# Patient Record
Sex: Female | Born: 1955
Health system: Southern US, Community
[De-identification: ages and names within clinical notes are randomized; demographics above are authoritative.]

## PROBLEM LIST (undated history)

## (undated) ENCOUNTER — Ambulatory Visit (HOSPITAL_COMMUNITY): Admission: EM | Payer: Medicare HMO

## (undated) ENCOUNTER — Ambulatory Visit (INDEPENDENT_AMBULATORY_CARE_PROVIDER_SITE_OTHER): Admission: EM | Payer: Medicare HMO | Source: Home / Self Care

## (undated) ENCOUNTER — Ambulatory Visit (HOSPITAL_COMMUNITY): Admission: EM | Payer: Medicare HMO | Source: Home / Self Care

## (undated) DIAGNOSIS — E119 Type 2 diabetes mellitus without complications: Secondary | ICD-10-CM

## (undated) DIAGNOSIS — R7611 Nonspecific reaction to tuberculin skin test without active tuberculosis: Secondary | ICD-10-CM

## (undated) DIAGNOSIS — I10 Essential (primary) hypertension: Secondary | ICD-10-CM

## (undated) DIAGNOSIS — E785 Hyperlipidemia, unspecified: Secondary | ICD-10-CM

## (undated) DIAGNOSIS — M542 Cervicalgia: Secondary | ICD-10-CM

## (undated) HISTORY — DX: Cervicalgia: M54.2

## (undated) HISTORY — DX: Essential (primary) hypertension: I10

## (undated) HISTORY — PX: BREAST CYST EXCISION: SHX579

## (undated) HISTORY — DX: Type 2 diabetes mellitus without complications: E11.9

## (undated) HISTORY — PX: BREAST EXCISIONAL BIOPSY: SUR124

## (undated) HISTORY — DX: Nonspecific reaction to tuberculin skin test without active tuberculosis: R76.11

## (undated) HISTORY — DX: Hyperlipidemia, unspecified: E78.5

## (undated) HISTORY — PX: ABDOMINAL HYSTERECTOMY: SHX81

---

## 2013-07-11 HISTORY — PX: CYST REMOVAL LEG: SHX6280

## 2014-02-11 ENCOUNTER — Ambulatory Visit: Payer: Self-pay | Admitting: Internal Medicine

## 2014-02-11 ENCOUNTER — Encounter: Payer: Self-pay | Admitting: Internal Medicine

## 2014-02-11 ENCOUNTER — Ambulatory Visit (INDEPENDENT_AMBULATORY_CARE_PROVIDER_SITE_OTHER): Payer: No Typology Code available for payment source | Admitting: Internal Medicine

## 2014-02-11 VITALS — BP 141/92 | HR 89 | Temp 97.9°F | Ht 65.0 in | Wt 193.0 lb

## 2014-02-11 DIAGNOSIS — E119 Type 2 diabetes mellitus without complications: Secondary | ICD-10-CM

## 2014-02-11 DIAGNOSIS — R7611 Nonspecific reaction to tuberculin skin test without active tuberculosis: Secondary | ICD-10-CM | POA: Insufficient documentation

## 2014-02-11 DIAGNOSIS — I10 Essential (primary) hypertension: Secondary | ICD-10-CM

## 2014-02-11 DIAGNOSIS — E785 Hyperlipidemia, unspecified: Secondary | ICD-10-CM

## 2014-02-11 HISTORY — DX: Hyperlipidemia, unspecified: E78.5

## 2014-02-11 LAB — BASIC METABOLIC PANEL
BUN: 14 mg/dL (ref 6–23)
CHLORIDE: 103 meq/L (ref 96–112)
CO2: 26 mEq/L (ref 19–32)
CREATININE: 0.8 mg/dL (ref 0.4–1.2)
Calcium: 9.8 mg/dL (ref 8.4–10.5)
GFR: 101.11 mL/min (ref >60.00)
Glucose, Bld: 119 mg/dL — ABNORMAL HIGH (ref 70–99)
Potassium: 3.8 mEq/L (ref 3.5–5.1)
SODIUM: 138 meq/L (ref 135–145)

## 2014-02-11 LAB — LIPID PANEL
Cholesterol: 196 mg/dL (ref 0–200)
HDL: 73.7 mg/dL (ref >39.00)
LDL Cholesterol: 112 mg/dL — ABNORMAL HIGH (ref 0–99)
NONHDL: 122.3
Total CHOL/HDL Ratio: 3
Triglycerides: 54 mg/dL (ref 0.0–149.0)
VLDL: 10.8 mg/dL (ref 0.0–40.0)

## 2014-02-11 LAB — HEMOGLOBIN A1C: HEMOGLOBIN A1C: 6.6 % — AB (ref 4.6–6.5)

## 2014-02-11 LAB — AST: AST: 17 U/L (ref 0–37)

## 2014-02-11 LAB — ALT: ALT: 14 U/L (ref 0–35)

## 2014-02-11 MED ORDER — METFORMIN HCL 1000 MG PO TABS: 1000.0000 mg | ORAL_TABLET | Freq: Every day | ORAL | Status: DC

## 2014-02-11 MED ORDER — LISINOPRIL 20 MG PO TABS: 20.0000 mg | ORAL_TABLET | Freq: Every day | ORAL | Status: DC

## 2014-02-11 MED ORDER — GLUCOSE BLOOD VI STRP: ORAL_STRIP | Status: DC

## 2014-02-11 MED ORDER — LOVASTATIN 20 MG PO TABS: 20.0000 mg | ORAL_TABLET | Freq: Every day | ORAL | Status: DC

## 2014-02-11 MED ORDER — AMLODIPINE BESYLATE 10 MG PO TABS: 10.0000 mg | ORAL_TABLET | Freq: Every day | ORAL | Status: DC

## 2014-02-11 NOTE — Assessment & Plan Note (Signed)
Symptoms well controlled, continue with amlodipine and lisinopril. Labs

## 2014-02-11 NOTE — Progress Notes (Signed)
Subjective:    Patient ID: Lisa CitizenMaima Valley, female    DOB: [birth year], 58 y.o.   MRN: [id number]  DOS:  02/11/2014 Type of visit - description : new patient  Interval history: Hypertension, good compliance with medications, BPs usually 130/80, occasionally do go up to 140/90. Diabetes, on metformin, CBGs in the morning before breakfast are usually 115, sometimes go to the 130s. High cholesterol, good compliance with medication without apparent side effects.   Labs from 04/03/2013: CBC normal, creatinine 0.6, potassium 3.8. AST, ALT normal. Total cholesterol 199, triglycerides 53, HDL 80, LDL 108 A1c 6.9 PSA 0.8 Vitamin D (which is low)  ROS No  CP, SOB  Denies  nausea, vomiting diarrhea, blood in the stools  (-) cough, sputum production (-) wheezing, chest congestion     Past Medical History  Diagnosis Date  . Neck pain   . Diabetes mellitus without complication   . Hypertension   . 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 Hyperlipidemia 02/11/2014    Past Surgical History  Procedure Laterality Date  . Abdominal hysterectomy      abdominal, no oophorectomy  . Cyst removal leg  07-2013    inner L thigh, Dr Vickki Muff[place]on, [name]   . Breast cyst excision      History   Social History  . Marital Status: Single    Spouse Name: N/A    Number of Children: 1  . Years of Education: N/A   Occupational History  . CNA -- [employer]     Social History Main Topics  . Smoking status: Never Smoker   . Smokeless tobacco: Never Used  . Alcohol Use: 0.0 oz/week    0 Not specified per week     Comment: wine, rare   . Drug Use: No  . Sexual Activity: Not on file   Other Topics Concern  . Not on file   Social History Narrative   Born in [place]   [place] years, [place], [place]   Lives w/ sister      Family History  Problem Relation Age of Onset  . Colon cancer Neg Hx   . Breast cancer Neg Hx   . CAD Neg Hx   . Diabetes Neg Hx       Medication List        This list is accurate as of: 02/11/14  6:49 PM.  Always use your most recent med list.               amLODipine 10 MG tablet  Commonly known as:  NORVASC  Take 1 tablet (10 mg total) by mouth daily.     glucose blood test strip  Use as instructed     lisinopril 20 MG tablet  Commonly known as:  PRINIVIL,ZESTRIL  Take 1 tablet (20 mg total) by mouth daily.     lovastatin 20 MG tablet  Commonly known as:  MEVACOR  Take 1 tablet (20 mg total) by mouth at bedtime.     metFORMIN 1000 MG tablet  Commonly known as:  GLUCOPHAGE  Take 1 tablet (1,000 mg total) by mouth daily with breakfast.           Objective:   Physical Exam BP 141/92 mmHg  Pulse 89  Temp(Src) 97.9 F (36.6 C) (Oral)  Ht 5\' 5"  (1.651 m)  Wt 193 lb (87.544 kg)  BMI 32.12 kg/m2  SpO2 100%    General -- alert, well-developed, NAD.  Neck --no thyromegaly  HEENT-- Not pale.   Lungs -- normal respiratory effort, no intercostal retractions, no accessory muscle use, and normal breath sounds.  Heart-- normal rate, regular rhythm, no murmur.  Abdomen-- Not distended, good bowel sounds,soft, non-tender.  Extremities-- no pretibial edema bilaterally  Neurologic--  alert & oriented X3. Speech normal, gait appropriate for age, strength symmetric and appropriate for age.  Psych-- Cognition and judgment appear intact. Cooperative with normal attention span and concentration. No anxious or depressed appearing.       Assessment & Plan:   Also reports: Last Pap smear 2008 Pregnancies 3, deliveries 1 Last mammogram 2007

## 2014-02-11 NOTE — Patient Instructions (Addendum)
Get your blood work before you leave   All about diabetes, great resource!  http://www.[facility].com/http://www.[facility].org/diabetes-information.html   Please come back to the office in 3 months  for a routine check up , no  fasting

## 2014-02-11 NOTE — Progress Notes (Signed)
Pre visit review using our clinic review tool, if applicable. No additional management support is needed unless otherwise documented below in the visit note. 

## 2014-02-11 NOTE — Assessment & Plan Note (Addendum)
Continue with metformin 1000 mg 1 by mouth daily. Had an eye exam 6 months ago, reportedly no retinopathy. She still has occasional blurred vision, she  thinks related to high sugars. We discussed diet, exercise, refer her to read from the [facility] website. Check A1c, adjust medications if needed

## 2014-02-11 NOTE — Assessment & Plan Note (Signed)
Continue with Mevacor, check FLP

## 2014-02-11 NOTE — Assessment & Plan Note (Signed)
+   PPD in the 90s, does not get further PPDs, gets quantiferon  levels

## 2014-02-15 MED ORDER — LOVASTATIN 40 MG PO TABS: 40.0000 mg | ORAL_TABLET | Freq: Every day | ORAL | Status: DC

## 2014-02-15 NOTE — Addendum Note (Signed)
Addended by: Dorette GrateFAULKNER, Mats Jeanlouis C on: 02/15/2014 01:29 PM   Modules accepted: Orders, Medications

## 2014-05-14 ENCOUNTER — Ambulatory Visit: Payer: No Typology Code available for payment source | Admitting: Internal Medicine

## 2014-05-15 ENCOUNTER — Ambulatory Visit: Payer: No Typology Code available for payment source | Admitting: Internal Medicine

## 2014-06-26 ENCOUNTER — Ambulatory Visit: Payer: No Typology Code available for payment source | Attending: Internal Medicine | Admitting: Internal Medicine

## 2014-06-26 ENCOUNTER — Encounter: Payer: Self-pay | Admitting: Internal Medicine

## 2014-06-26 VITALS — BP 138/85 | HR 64 | Temp 98.0°F | Resp 16 | Wt 199.2 lb

## 2014-06-26 DIAGNOSIS — E785 Hyperlipidemia, unspecified: Secondary | ICD-10-CM

## 2014-06-26 DIAGNOSIS — E139 Other specified diabetes mellitus without complications: Secondary | ICD-10-CM

## 2014-06-26 DIAGNOSIS — Z139 Encounter for screening, unspecified: Secondary | ICD-10-CM

## 2014-06-26 DIAGNOSIS — I10 Essential (primary) hypertension: Secondary | ICD-10-CM

## 2014-06-26 DIAGNOSIS — Z1211 Encounter for screening for malignant neoplasm of colon: Secondary | ICD-10-CM

## 2014-06-26 DIAGNOSIS — Z1231 Encounter for screening mammogram for malignant neoplasm of breast: Secondary | ICD-10-CM

## 2014-06-26 LAB — GLUCOSE, POCT (MANUAL RESULT ENTRY): POC Glucose: 106 mg/dl — AB (ref 70–99)

## 2014-06-26 LAB — POCT GLYCOSYLATED HEMOGLOBIN (HGB A1C): Hemoglobin A1C: 6.4

## 2014-06-26 MED ORDER — AMLODIPINE BESYLATE 10 MG PO TABS: 10.0000 mg | ORAL_TABLET | Freq: Every day | ORAL | Status: DC

## 2014-06-26 NOTE — Progress Notes (Signed)
Patient here to establish care Has history of diabetes and hypertension

## 2014-06-26 NOTE — Progress Notes (Signed)
Patient Demographics  Lisa Meyers, is a 59 y.o. female  GNF:[id number]  QIO:[id number]  DOB - 1956-01-25  CC:  Chief Complaint  Patient presents with  . Establish Care       HPI: Lisa Meyers is a 59 y.o. female here today to establish medical care.patient has history of diabetes, hypertension, hyperlipidemia, she is compliant in taking her medications, her diabetes is well controlled hemoglobin A1c today 6.4%, she denies any hypoglycemic symptoms. Patient is requesting refill on her medication. Patient has No headache, No chest pain, No abdominal pain - No Nausea, No new weakness tingling or numbness, No Cough - SOB.  No Known Allergies Past Medical History  Diagnosis Date  . Neck pain   . Diabetes mellitus without complication   . Hypertension   . 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 Hyperlipidemia 02/11/2014   Current Outpatient Prescriptions on File Prior to Visit  Medication Sig Dispense Refill  . glucose blood test strip Use as instructed 100 each 12  . lisinopril (PRINIVIL,ZESTRIL) 20 MG tablet Take 1 tablet (20 mg total) by mouth daily. 30 tablet 6  . lovastatin (MEVACOR) 40 MG tablet Take 1 tablet (40 mg total) by mouth at bedtime. 30 tablet 3  . metFORMIN (GLUCOPHAGE) 1000 MG tablet Take 1 tablet (1,000 mg total) by mouth daily with breakfast. 30 tablet 6   No current facility-administered medications on file prior to visit.   Family History  Problem Relation Age of Onset  . Colon cancer Neg Hx   . Breast cancer Neg Hx   . CAD Neg Hx   . Hypertension Mother   . Hypertension Father   . Heart disease Father   . Diabetes Maternal Aunt    History   Social History  . Marital Status: Single    Spouse Name: N/A  . Number of Children: 1  . Years of Education: N/A   Occupational History  . CNA -- [employer]     Social History Main Topics  . Smoking status: Never Smoker   . Smokeless tobacco: Never Used  . Alcohol Use: 0.0 oz/week    0 Standard drinks or  equivalent per week     Comment: wine, rare   . Drug Use: No  . Sexual Activity: Not on file   Other Topics Concern  . Not on file   Social History Narrative   Born in [place]   Live in [place] x years, then [place], moved to [place]   Lives w/ sister     Review of Systems: Constitutional: Negative for fever, chills, diaphoresis, activity change, appetite change and fatigue. HENT: Negative for ear pain, nosebleeds, congestion, facial swelling, rhinorrhea, neck pain, neck stiffness and ear discharge.  Eyes: Negative for pain, discharge, redness, itching and visual disturbance. Respiratory: Negative for cough, choking, chest tightness, shortness of breath, wheezing and stridor.  Cardiovascular: Negative for chest pain, palpitations and leg swelling. Gastrointestinal: Negative for abdominal distention. Genitourinary: Negative for dysuria, urgency, frequency, hematuria, flank pain, decreased urine volume, difficulty urinating and dyspareunia.  Musculoskeletal: Negative for back pain, joint swelling, arthralgia and gait problem. Neurological: Negative for dizziness, tremors, seizures, syncope, facial asymmetry, speech difficulty, weakness, light-headedness, numbness and headaches.  Hematological: Negative for adenopathy. Does not bruise/bleed easily. Psychiatric/Behavioral: Negative for hallucinations, behavioral problems, confusion, dysphoric mood, decreased concentration and agitation.    Objective:   Filed Vitals:   06/26/14 1049  BP: 138/85  Pulse: 64  Temp: 98 F (36.7 C)  Resp: 16    Physical Exam: Constitutional: Patient appears well-developed and well-nourished. No distress. HENT: Normocephalic, atraumatic, External right and left ear normal. Oropharynx is clear and moist.  Eyes: Conjunctivae and EOM are normal. PERRLA, no scleral icterus. Neck: Normal ROM. Neck supple. No JVD. No tracheal deviation. No thyromegaly. CVS: RRR, S1/S2 +, no murmurs, no gallops, no  carotid bruit.  Pulmonary: Effort and breath sounds normal, no stridor, rhonchi, wheezes, rales.  Abdominal: Soft. BS +, no distension, tenderness, rebound or guarding.  Musculoskeletal: Normal range of motion. No edema and no tenderness.  Neuro: Alert. Normal reflexes, muscle tone coordination. No cranial nerve deficit. Skin: Skin is warm and dry. No rash noted. Not diaphoretic. No erythema. No pallor. Psychiatric: Normal mood and affect. Behavior, judgment, thought content normal.  No results found for: WBC, HGB, HCT, MCV, PLT Lab Results  Component Value Date   CREATININE 0.8 02/11/2014   BUN 14 02/11/2014   NA 138 02/11/2014   K 3.8 02/11/2014   CL 103 02/11/2014   CO2 26 02/11/2014    Lab Results  Component Value Date   HGBA1C 6.40 06/26/2014   Lipid Panel     Component Value Date/Time   CHOL 196 02/11/2014 1058   TRIG 54.0 02/11/2014 1058   HDL 73.70 02/11/2014 1058   CHOLHDL 3 02/11/2014 1058   VLDL 10.8 02/11/2014 1058   LDLCALC 112* 02/11/2014 1058       Assessment and plan:   1. Other specified diabetes mellitus without complications Results for orders placed or performed in visit on 06/26/14  Glucose (CBG)  Result Value Ref Range   POC Glucose 106.0 (A) 70 - 99 mg/dl  HgB [id number]  Result Value Ref Range   Hemoglobin A1C 6.40    Diabetes is well controlled, continue with her metformin. Will recheck A1c in 3 months. - Glucose (CBG) - HgB A1c - COMPLETE METABOLIC PANEL WITH GFR; Future  2. Essential hypertension Advise patient for DASH diet, continue with Norvasc and lisinopril. - amLODipine (NORVASC) 10 MG tablet; Take 1 tablet (10 mg total) by mouth daily.  Dispense: 30 tablet; Refill: 3 - COMPLETE METABOLIC PANEL WITH GFR; Future  3. Hyperlipidemia Currently patient is on Mevacor 40 mg daily, will check fasting lipid panel on next visit.  4. Encounter for screening mammogram for breast cancer  - MM DIGITAL SCREENING BILATERAL; Future  5. Special  screening for malignant neoplasms, colon  - Ambulatory referral to Gastroenterology  6. Screening Ordered baseline blood work. Patient would like to come back in one month's time. - CBC with Differential/Platelet; Future - Lipid panel; Future - TSH; Future - Vit D  25 hydroxy (rtn osteoporosis monitoring); Future        Health Maintenance -Colonoscopy:referred to GI -Pap Smear:will be scheduled -Mammogram:ordered -Vaccinations:  Up-to-date with flu shot.  Return in about 3 months (around 09/26/2014) for diabetes, hypertension, hyperipidemia, Schedule Appt with Dr Glendora ScoreFunches/ [facility] for PAP.    The patient was given clear instructions to go to ER or return to medical center if symptoms don't improve, worsen or new problems develop. The patient verbalized understanding. The patient was told to call to get lab results if they haven't heard anything in the next week.    This note has been created with Education officer, environmentalDragon speech recognition software and smart phrase technology. Any transcriptional errors are unintentional.   Doris CheadleADVANI, Moranda Billiot, MD

## 2014-06-26 NOTE — Patient Instructions (Signed)
DASH Eating Plan DASH stands for "Dietary Approaches to Stop Hypertension." The DASH eating plan is a healthy eating plan that has been shown to reduce high blood pressure (hypertension). Additional health benefits may include reducing the risk of type 2 diabetes mellitus, heart disease, and stroke. The DASH eating plan may also help with weight loss. WHAT DO I NEED TO KNOW ABOUT THE DASH EATING PLAN? For the DASH eating plan, you will follow these general guidelines:  Choose foods with a percent daily value for sodium of less than 5% (as listed on the food label).  Use salt-free seasonings or herbs instead of table salt or sea salt.  Check with your health care provider or pharmacist before using salt substitutes.  Eat lower-sodium products, often labeled as "lower sodium" or "no salt added."  Eat fresh foods.  Eat more vegetables, fruits, and low-fat dairy products.  Choose whole grains. Look for the word "whole" as the first word in the ingredient list.  Choose fish and skinless chicken or turkey more often than red meat. Limit fish, poultry, and meat to 6 oz (170 g) each day.  Limit sweets, desserts, sugars, and sugary drinks.  Choose heart-healthy fats.  Limit cheese to 1 oz (28 g) per day.  Eat more home-cooked food and less restaurant, buffet, and fast food.  Limit fried foods.  Cook foods using methods other than frying.  Limit canned vegetables. If you do use them, rinse them well to decrease the sodium.  When eating at a restaurant, ask that your food be prepared with less salt, or no salt if possible. WHAT FOODS CAN I EAT? Seek help from a dietitian for individual calorie needs. Grains Whole grain or whole wheat bread. Brown rice. Whole grain or whole wheat pasta. Quinoa, bulgur, and whole grain cereals. Low-sodium cereals. Corn or whole wheat flour tortillas. Whole grain cornbread. Whole grain crackers. Low-sodium crackers. Vegetables Fresh or frozen vegetables  (raw, steamed, roasted, or grilled). Low-sodium or reduced-sodium tomato and vegetable juices. Low-sodium or reduced-sodium tomato sauce and paste. Low-sodium or reduced-sodium canned vegetables.  Fruits All fresh, canned (in natural juice), or frozen fruits. Meat and Other Protein Products Ground beef (85% or leaner), grass-fed beef, or beef trimmed of fat. Skinless chicken or turkey. Ground chicken or turkey. Pork trimmed of fat. All fish and seafood. Eggs. Dried beans, peas, or lentils. Unsalted nuts and seeds. Unsalted canned beans. Dairy Low-fat dairy products, such as skim or 1% milk, 2% or reduced-fat cheeses, low-fat ricotta or cottage cheese, or plain low-fat yogurt. Low-sodium or reduced-sodium cheeses. Fats and Oils Tub margarines without trans fats. Light or reduced-fat mayonnaise and salad dressings (reduced sodium). Avocado. Safflower, olive, or canola oils. Natural peanut or almond butter. Other Unsalted popcorn and pretzels. The items listed above may not be a complete list of recommended foods or beverages. Contact your dietitian for more options. WHAT FOODS ARE NOT RECOMMENDED? Grains White bread. White pasta. White rice. Refined cornbread. Bagels and croissants. Crackers that contain trans fat. Vegetables Creamed or fried vegetables. Vegetables in a cheese sauce. Regular canned vegetables. Regular canned tomato sauce and paste. Regular tomato and vegetable juices. Fruits Dried fruits. Canned fruit in light or heavy syrup. Fruit juice. Meat and Other Protein Products Fatty cuts of meat. Ribs, chicken wings, bacon, sausage, bologna, salami, chitterlings, fatback, hot dogs, bratwurst, and packaged luncheon meats. Salted nuts and seeds. Canned beans with salt. Dairy Whole or 2% milk, cream, half-and-half, and cream cheese. Whole-fat or sweetened yogurt. Full-fat   cheeses or blue cheese. Nondairy creamers and whipped toppings. Processed cheese, cheese spreads, or cheese  curds. Condiments Onion and garlic salt, seasoned salt, table salt, and sea salt. Canned and packaged gravies. Worcestershire sauce. Tartar sauce. Barbecue sauce. Teriyaki sauce. Soy sauce, including reduced sodium. Steak sauce. Fish sauce. Oyster sauce. Cocktail sauce. Horseradish. Ketchup and mustard. Meat flavorings and tenderizers. Bouillon cubes. Hot sauce. Tabasco sauce. Marinades. Taco seasonings. Relishes. Fats and Oils Butter, stick margarine, lard, shortening, ghee, and bacon fat. Coconut, palm kernel, or palm oils. Regular salad dressings. Other Pickles and olives. Salted popcorn and pretzels. The items listed above may not be a complete list of foods and beverages to avoid. Contact your dietitian for more information. WHERE CAN I FIND MORE INFORMATION? National Heart, Lung, and Blood Institute: www.nhlbi.nih.gov/health/health-topics/topics/dash/ Document Released: 03/18/2011 Document Revised: 08/13/2013 Document Reviewed: 01/31/2013 ExitCare Patient Information 2015 [employer]. This information is not intended to replace advice given to you by your health care provider. Make sure you discuss any questions you have with your health care provider. Diabetes Mellitus and Food It is important for you to manage your blood sugar (glucose) level. Your blood glucose level can be greatly affected by what you eat. Eating healthier foods in the appropriate amounts throughout the day at about the same time each day will help you control your blood glucose level. It can also help slow or prevent worsening of your diabetes mellitus. Healthy eating may even help you improve the level of your blood pressure and reach or maintain a healthy weight.  HOW CAN FOOD AFFECT ME? Carbohydrates Carbohydrates affect your blood glucose level more than any other type of food. Your dietitian will help you determine how many carbohydrates to eat at each meal and teach you how to count carbohydrates. Counting  carbohydrates is important to keep your blood glucose at a healthy level, especially if you are using insulin or taking certain medicines for diabetes mellitus. Alcohol Alcohol can cause sudden decreases in blood glucose (hypoglycemia), especially if you use insulin or take certain medicines for diabetes mellitus. Hypoglycemia can be a life-threatening condition. Symptoms of hypoglycemia (sleepiness, dizziness, and disorientation) are similar to symptoms of having too much alcohol.  If your health care provider has given you approval to drink alcohol, do so in moderation and use the following guidelines:  Women should not have more than one drink per day, and men should not have more than two drinks per day. One drink is equal to:  12 oz of beer.  5 oz of wine.  1 oz of hard liquor.  Do not drink on an empty stomach.  Keep yourself hydrated. Have water, diet soda, or unsweetened iced tea.  Regular soda, juice, and other mixers might contain a lot of carbohydrates and should be counted. WHAT FOODS ARE NOT RECOMMENDED? As you make food choices, it is important to remember that all foods are not the same. Some foods have fewer nutrients per serving than other foods, even though they might have the same number of calories or carbohydrates. It is difficult to get your body what it needs when you eat foods with fewer nutrients. Examples of foods that you should avoid that are high in calories and carbohydrates but low in nutrients include:  Trans fats (most processed foods list trans fats on the Nutrition Facts label).  Regular soda.  Juice.  Candy.  Sweets, such as cake, pie, doughnuts, and cookies.  Fried foods. WHAT FOODS CAN I EAT? Have nutrient-rich foods,   which will nourish your body and keep you healthy. The food you should eat also will depend on several factors, including:  The calories you need.  The medicines you take.  Your weight.  Your blood glucose level.  Your  blood pressure level.  Your cholesterol level. You also should eat a variety of foods, including:  Protein, such as meat, poultry, fish, tofu, nuts, and seeds (lean animal proteins are best).  Fruits.  Vegetables.  Dairy products, such as milk, cheese, and yogurt (low fat is best).  Breads, grains, pasta, cereal, rice, and beans.  Fats such as olive oil, trans fat-free margarine, canola oil, avocado, and olives. DOES EVERYONE WITH DIABETES MELLITUS HAVE THE SAME MEAL PLAN? Because every person with diabetes mellitus is different, there is not one meal plan that works for everyone. It is very important that you meet with a dietitian who will help you create a meal plan that is just right for you. Document Released: 12/24/2004 Document Revised: 04/03/2013 Document Reviewed: 02/23/2013 ExitCare Patient Information 2015 [employer], [employer]. This information is not intended to replace advice given to you by your health care provider. Make sure you discuss any questions you have with your health care provider.  

## 2014-07-02 ENCOUNTER — Ambulatory Visit: Payer: No Typology Code available for payment source | Attending: Internal Medicine

## 2014-08-05 ENCOUNTER — Ambulatory Visit (HOSPITAL_COMMUNITY): Payer: Self-pay

## 2014-08-05 ENCOUNTER — Ambulatory Visit (HOSPITAL_COMMUNITY)
Admission: RE | Admit: 2014-08-05 | Discharge: 2014-08-05 | Disposition: A | Discharge status: Home / Self Care | Payer: Self-pay | Source: Ambulatory Visit | Attending: Internal Medicine | Admitting: Internal Medicine

## 2014-08-05 DIAGNOSIS — Z1231 Encounter for screening mammogram for malignant neoplasm of breast: Secondary | ICD-10-CM | POA: Insufficient documentation

## 2014-08-13 ENCOUNTER — Telehealth: Payer: Self-pay

## 2014-08-13 NOTE — Telephone Encounter (Signed)
-----   Message from Dessa PhiJosalyn Funches, MD sent at 08/13/2014  1:17 PM EDT ----- Negative screening mammogram

## 2014-08-13 NOTE — Telephone Encounter (Signed)
Patient not available Left message on voice mail to return our call 

## 2014-09-19 ENCOUNTER — Encounter (HOSPITAL_COMMUNITY): Payer: Self-pay | Admitting: Emergency Medicine

## 2014-09-19 ENCOUNTER — Emergency Department (HOSPITAL_COMMUNITY)
Admission: EM | Admit: 2014-09-19 | Discharge: 2014-09-19 | Disposition: A | Discharge status: Home / Self Care | Payer: No Typology Code available for payment source | Attending: Emergency Medicine | Admitting: Emergency Medicine

## 2014-09-19 DIAGNOSIS — Z79899 Other long term (current) drug therapy: Secondary | ICD-10-CM | POA: Diagnosis not present

## 2014-09-19 DIAGNOSIS — E119 Type 2 diabetes mellitus without complications: Secondary | ICD-10-CM | POA: Diagnosis not present

## 2014-09-19 DIAGNOSIS — S8991XA Unspecified injury of right lower leg, initial encounter: Secondary | ICD-10-CM | POA: Insufficient documentation

## 2014-09-19 DIAGNOSIS — E785 Hyperlipidemia, unspecified: Secondary | ICD-10-CM | POA: Diagnosis not present

## 2014-09-19 DIAGNOSIS — I10 Essential (primary) hypertension: Secondary | ICD-10-CM | POA: Diagnosis not present

## 2014-09-19 DIAGNOSIS — Y9389 Activity, other specified: Secondary | ICD-10-CM | POA: Insufficient documentation

## 2014-09-19 DIAGNOSIS — S79911A Unspecified injury of right hip, initial encounter: Secondary | ICD-10-CM | POA: Insufficient documentation

## 2014-09-19 DIAGNOSIS — Y998 Other external cause status: Secondary | ICD-10-CM | POA: Insufficient documentation

## 2014-09-19 DIAGNOSIS — S99911A Unspecified injury of right ankle, initial encounter: Secondary | ICD-10-CM | POA: Diagnosis not present

## 2014-09-19 DIAGNOSIS — Y92410 Unspecified street and highway as the place of occurrence of the external cause: Secondary | ICD-10-CM | POA: Diagnosis not present

## 2014-09-19 MED ORDER — IBUPROFEN 800 MG PO TABS: 800.0000 mg | ORAL_TABLET | Freq: Three times a day (TID) | ORAL | Status: DC

## 2014-09-19 MED ORDER — METHOCARBAMOL 500 MG PO TABS: 500.0000 mg | ORAL_TABLET | Freq: Two times a day (BID) | ORAL | Status: DC
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
## 2014-09-19 NOTE — Discharge Instructions (Signed)

## 2014-10-28 ENCOUNTER — Ambulatory Visit: Payer: No Typology Code available for payment source | Attending: Internal Medicine

## 2014-10-28 ENCOUNTER — Telehealth: Payer: Self-pay | Admitting: Internal Medicine

## 2014-10-28 DIAGNOSIS — I10 Essential (primary) hypertension: Secondary | ICD-10-CM

## 2014-10-28 DIAGNOSIS — Z139 Encounter for screening, unspecified: Secondary | ICD-10-CM

## 2014-10-28 DIAGNOSIS — E139 Other specified diabetes mellitus without complications: Secondary | ICD-10-CM

## 2014-10-28 LAB — CBC WITH DIFFERENTIAL/PLATELET
BASOS ABS: 0 10*3/uL (ref 0.0–0.1)
BASOS PCT: 0 % (ref 0–1)
Eosinophils Absolute: 0 10*3/uL (ref 0.0–0.7)
Eosinophils Relative: 1 % (ref 0–5)
HEMATOCRIT: 40.6 % (ref 36.0–46.0)
HEMOGLOBIN: 13.7 g/dL (ref 12.0–15.0)
LYMPHS PCT: 49 % — AB (ref 12–46)
Lymphs Abs: 2.4 10*3/uL (ref 0.7–4.0)
MCH: 29.8 pg (ref 26.0–34.0)
MCHC: 33.7 g/dL (ref 30.0–36.0)
MCV: 88.3 fL (ref 78.0–100.0)
MONO ABS: 0.3 10*3/uL (ref 0.1–1.0)
MPV: 10 fL (ref 8.6–12.4)
Monocytes Relative: 7 % (ref 3–12)
Neutro Abs: 2.1 10*3/uL (ref 1.7–7.7)
Neutrophils Relative %: 43 % (ref 43–77)
Platelets: 262 10*3/uL (ref 150–400)
RBC: 4.6 MIL/uL (ref 3.87–5.11)
RDW: 14.3 % (ref 11.5–15.5)
WBC: 4.9 10*3/uL (ref 4.0–10.5)

## 2014-10-28 LAB — LIPID PANEL
Cholesterol: 199 mg/dL (ref 0–200)
HDL: 80 mg/dL (ref >=46)
LDL Cholesterol: 109 mg/dL — ABNORMAL HIGH (ref 0–99)
Total CHOL/HDL Ratio: 2.5 Ratio
Triglycerides: 52 mg/dL (ref <150)
VLDL: 10 mg/dL (ref 0–40)

## 2014-10-28 LAB — COMPLETE METABOLIC PANEL WITH GFR
ALBUMIN: 4 g/dL (ref 3.5–5.2)
ALK PHOS: 52 U/L (ref 39–117)
ALT: 13 U/L (ref 0–35)
AST: 17 U/L (ref 0–37)
BUN: 16 mg/dL (ref 6–23)
CO2: 24 meq/L (ref 19–32)
Calcium: 9.2 mg/dL (ref 8.4–10.5)
Chloride: 107 mEq/L (ref 96–112)
Creat: 0.7 mg/dL (ref 0.50–1.10)
GFR, Est African American: >89 mL/min
GFR, Est Non African American: >89 mL/min
Glucose, Bld: 125 mg/dL — ABNORMAL HIGH (ref 70–99)
Potassium: 3.8 mEq/L (ref 3.5–5.3)
Sodium: 142 mEq/L (ref 135–145)
Total Bilirubin: 0.4 mg/dL (ref 0.2–1.2)
Total Protein: 7.5 g/dL (ref 6.0–8.3)

## 2014-10-28 LAB — TSH: TSH: 0.919 u[IU]/mL (ref 0.350–4.500)

## 2014-10-28 NOTE — Telephone Encounter (Signed)
Patient came into facility to request a med refill for Metformin, Lovastatin, and Lisinopril. Please f/u with pt.

## 2014-10-28 NOTE — Telephone Encounter (Signed)
Patient would [facility] medications sent to [facility] [address] [facility]

## 2014-10-29 ENCOUNTER — Telehealth: Payer: Self-pay

## 2014-10-29 DIAGNOSIS — I10 Essential (primary) hypertension: Secondary | ICD-10-CM

## 2014-10-29 LAB — VITAMIN D 25 HYDROXY (VIT D DEFICIENCY, FRACTURES): Vit D, 25-Hydroxy: 36 ng/mL (ref 30–100)

## 2014-10-29 MED ORDER — METFORMIN HCL 1000 MG PO TABS: 1000.0000 mg | ORAL_TABLET | Freq: Every day | ORAL | Status: DC

## 2014-10-29 MED ORDER — LISINOPRIL 20 MG PO TABS: 20.0000 mg | ORAL_TABLET | Freq: Every day | ORAL | Status: DC

## 2014-10-29 MED ORDER — LOVASTATIN 40 MG PO TABS: 40.0000 mg | ORAL_TABLET | Freq: Every day | ORAL | Status: DC

## 2014-10-29 MED ORDER — AMLODIPINE BESYLATE 10 MG PO TABS: 10.0000 mg | ORAL_TABLET | Freq: Every day | ORAL | Status: DC

## 2014-10-29 NOTE — Telephone Encounter (Signed)
Patient is aware of her lab results 

## 2014-10-29 NOTE — Telephone Encounter (Signed)
-----   Message from Doris Cheadleeepak Advani, MD sent at 10/29/2014 11:39 AM EDT ----- Call and let the patient know that her blood work is normal except for borderline elevated blood sugar level as well as borderline elevated LDL, continue with low carbohydrate low-fat diet and current medications.

## 2015-07-08 ENCOUNTER — Other Ambulatory Visit: Payer: Self-pay

## 2015-07-08 DIAGNOSIS — Z1231 Encounter for screening mammogram for malignant neoplasm of breast: Secondary | ICD-10-CM

## 2015-08-07 ENCOUNTER — Ambulatory Visit
Admission: RE | Admit: 2015-08-07 | Discharge: 2015-08-07 | Disposition: A | Discharge status: Home / Self Care | Payer: 59 | Source: Ambulatory Visit

## 2015-08-07 DIAGNOSIS — Z1231 Encounter for screening mammogram for malignant neoplasm of breast: Secondary | ICD-10-CM

## 2015-08-11 ENCOUNTER — Other Ambulatory Visit: Payer: Self-pay | Admitting: Internal Medicine

## 2015-08-11 DIAGNOSIS — R928 Other abnormal and inconclusive findings on diagnostic imaging of breast: Secondary | ICD-10-CM

## 2015-08-14 ENCOUNTER — Telehealth: Payer: Self-pay | Admitting: Internal Medicine

## 2015-08-14 ENCOUNTER — Other Ambulatory Visit: Payer: Self-pay | Admitting: Internal Medicine

## 2015-08-14 ENCOUNTER — Other Ambulatory Visit: Payer: Self-pay

## 2015-08-14 DIAGNOSIS — R928 Other abnormal and inconclusive findings on diagnostic imaging of breast: Secondary | ICD-10-CM

## 2015-08-14 NOTE — Telephone Encounter (Signed)
[facility]ed [facility] and order has been received.

## 2015-08-14 NOTE — Telephone Encounter (Signed)
Caller name: Jearld PiesFelia with [facility] Can be reached: [phone]   Reason for call: Pt scheduled for mamogram tomorrow and they need EPIC order signed off by physician and/or sign fax and return to them. They faxed orders on 5/1 and again 5/3 for signature. Please return today.

## 2015-08-14 NOTE — Telephone Encounter (Signed)
i already signed a paper order , if they didn't get it >  refax or [facility]

## 2015-08-15 ENCOUNTER — Ambulatory Visit
Admission: RE | Admit: 2015-08-15 | Discharge: 2015-08-15 | Disposition: A | Discharge status: Home / Self Care | Payer: 59 | Source: Ambulatory Visit | Attending: Internal Medicine | Admitting: Internal Medicine

## 2015-08-15 DIAGNOSIS — R928 Other abnormal and inconclusive findings on diagnostic imaging of breast: Secondary | ICD-10-CM

## 2015-08-18 ENCOUNTER — Other Ambulatory Visit: Payer: Self-pay | Admitting: Internal Medicine

## 2015-09-04 ENCOUNTER — Ambulatory Visit
Admission: RE | Admit: 2015-09-04 | Discharge: 2015-09-04 | Disposition: A | Discharge status: Home / Self Care | Payer: No Typology Code available for payment source | Source: Ambulatory Visit | Attending: Infectious Disease | Admitting: Infectious Disease

## 2015-09-04 ENCOUNTER — Other Ambulatory Visit: Payer: Self-pay | Admitting: Infectious Disease

## 2015-09-04 DIAGNOSIS — R7611 Nonspecific reaction to tuberculin skin test without active tuberculosis: Secondary | ICD-10-CM

## 2015-10-02 ENCOUNTER — Encounter: Payer: Self-pay | Admitting: Internal Medicine

## 2015-10-11 ENCOUNTER — Encounter (HOSPITAL_COMMUNITY): Payer: Self-pay

## 2015-10-11 ENCOUNTER — Emergency Department (HOSPITAL_COMMUNITY)
Admission: EM | Admit: 2015-10-11 | Discharge: 2015-10-11 | Disposition: A | Discharge status: Home / Self Care | Payer: 59 | Attending: Emergency Medicine | Admitting: Emergency Medicine

## 2015-10-11 DIAGNOSIS — Z79899 Other long term (current) drug therapy: Secondary | ICD-10-CM | POA: Diagnosis not present

## 2015-10-11 DIAGNOSIS — E119 Type 2 diabetes mellitus without complications: Secondary | ICD-10-CM | POA: Insufficient documentation

## 2015-10-11 DIAGNOSIS — I10 Essential (primary) hypertension: Secondary | ICD-10-CM | POA: Insufficient documentation

## 2015-10-11 DIAGNOSIS — E785 Hyperlipidemia, unspecified: Secondary | ICD-10-CM | POA: Insufficient documentation

## 2015-10-11 DIAGNOSIS — Z7984 Long term (current) use of oral hypoglycemic drugs: Secondary | ICD-10-CM | POA: Insufficient documentation

## 2015-10-11 MED ORDER — AMLODIPINE BESYLATE 10 MG PO TABS: 10.0000 mg | ORAL_TABLET | Freq: Every day | ORAL | Status: DC

## 2015-10-11 MED ORDER — AMLODIPINE BESYLATE 10 MG PO TABS
10.0000 mg | ORAL_TABLET | Freq: Once | ORAL | Status: AC
  Administered 2015-10-11: 10 mg via ORAL
  Filled 2015-10-11: qty 1

## 2015-10-11 NOTE — Discharge Instructions (Signed)
Please follow with your primary care doctor in the next 2 days for a check-up. They must obtain records for further management.  ° °Do not hesitate to return to the Emergency Department for any new, worsening or concerning symptoms.  ° ° °Hypertension °Hypertension, commonly called high blood pressure, is when the force of blood pumping through your arteries is too strong. Your arteries are the blood vessels that carry blood from your heart throughout your body. A blood pressure reading consists of a higher number over a lower number, such as 110/72. The higher number (systolic) is the pressure inside your arteries when your heart pumps. The lower number (diastolic) is the pressure inside your arteries when your heart relaxes. Ideally you want your blood pressure below 120/80. °Hypertension forces your heart to work harder to pump blood. Your arteries may become narrow or stiff. Having untreated or uncontrolled hypertension can cause heart attack, stroke, kidney disease, and other problems. °RISK FACTORS °Some risk factors for high blood pressure are controllable. Others are not.  °Risk factors you cannot control include:  °· Race. You may be at higher risk if you are African American. °· Age. Risk increases with age. °· Gender. Men are at higher risk than women before age 45 years. After age 65, women are at higher risk than men. °Risk factors you can control include: °· Not getting enough exercise or physical activity. °· Being overweight. °· Getting too much fat, sugar, calories, or salt in your diet. °· Drinking too much alcohol. °SIGNS AND SYMPTOMS °Hypertension does not usually cause signs or symptoms. Extremely high blood pressure (hypertensive crisis) may cause headache, anxiety, shortness of breath, and nosebleed. °DIAGNOSIS °To check if you have hypertension, your health care provider will measure your blood pressure while you are seated, with your arm held at the level of your heart. It should be measured  at least twice using the same arm. Certain conditions can cause a difference in blood pressure between your right and left arms. A blood pressure reading that is higher than normal on one occasion does not mean that you need treatment. If it is not clear whether you have high blood pressure, you may be asked to return on a different day to have your blood pressure checked again. Or, you may be asked to monitor your blood pressure at home for 1 or more weeks. °TREATMENT °Treating high blood pressure includes making lifestyle changes and possibly taking medicine. Living a healthy lifestyle can help lower high blood pressure. You may need to change some of your habits. °Lifestyle changes may include: °· Following the DASH diet. This diet is high in fruits, vegetables, and whole grains. It is low in salt, red meat, and added sugars. °· Keep your sodium intake below 2,300 mg per day. °· Getting at least 30-45 minutes of aerobic exercise at least 4 times per week. °· Losing weight if necessary. °· Not smoking. °· Limiting alcoholic beverages. °· Learning ways to reduce stress. °Your health care provider may prescribe medicine if lifestyle changes are not enough to get your blood pressure under control, and if one of the following is true: °· You are 18-59 years of age and your systolic blood pressure is above 140. °· You are 60 years of age or older, and your systolic blood pressure is above 150. °· Your diastolic blood pressure is above 90. °· You have diabetes, and your systolic blood pressure is over 140 or your diastolic blood pressure is over 90. °·   You have kidney disease and your blood pressure is above 140/90. °· You have heart disease and your blood pressure is above 140/90. °Your personal target blood pressure may vary depending on your medical conditions, your age, and other factors. °HOME CARE INSTRUCTIONS °· Have your blood pressure rechecked as directed by your health care provider.   °· Take medicines only  as directed by your health care provider. Follow the directions carefully. Blood pressure medicines must be taken as prescribed. The medicine does not work as well when you skip doses. Skipping doses also puts you at risk for problems. °· Do not smoke.   °· Monitor your blood pressure at home as directed by your health care provider.  °SEEK MEDICAL CARE IF:  °· You think you are having a reaction to medicines taken. °· You have recurrent headaches or feel dizzy. °· You have swelling in your ankles. °· You have trouble with your vision. °SEEK IMMEDIATE MEDICAL CARE IF: °· You develop a severe headache or confusion. °· You have unusual weakness, numbness, or feel faint. °· You have severe chest or abdominal pain. °· You vomit repeatedly. °· You have trouble breathing. °MAKE SURE YOU:  °· Understand these instructions. °· Will watch your condition. °· Will get help right away if you are not doing well or get worse. °  °This information is not intended to replace advice given to you by your health care provider. Make sure you discuss any questions you have with your health care provider. °  °Document Released: 03/29/2005 Document Revised: 08/13/2014 Document Reviewed: 01/19/2013 °Elsevier Interactive Patient Education ©2016 [employer] Inc[employer] ° °

## 2015-10-11 NOTE — ED Provider Notes (Signed)
CSN: [id number]     Arrival date & time 10/11/15  1043 History   First MD Initiated Contact with Patient 10/11/15 1110     Chief Complaint  Patient presents with  . Hypertension     (Consider location/radiation/quality/duration/timing/severity/associated sxs/prior Treatment) HPI   Blood pressure 181/94, pulse 87, temperature 98 F (36.7 C), temperature source Oral, resp. rate 17, SpO2 98 %.  Lisa Meyers is a 60 y.o. female complaining of past medical history significant for non-insulin-dependent diabetes, hyperlipidemia, hypertension recently being treated for positive PPD, she has a negative chest x-ray with no cough, unintentional weight loss, fever, chills, night sweats. States that her blood pressure has been elevated over the last 3 weeks, she has associated symptoms of lightheaded sensation when going from sitting to standing, she's been eating and drinking normally with no melena, hematochezia, chest pain or abdominal pain. Patient takes lisinopril for her hypertension and she ran out of her amlodipine approximately 4 weeks ago. She didn't want to check in with her primary care doctor because she wanted to wait until after she finished the TB antibiotics in August. Patient denies headache, change in vision, nausea, vomiting, dysarthria, cervicalgia, unilateral weakness or ataxia.  Past Medical History  Diagnosis Date  . Neck pain   . Diabetes mellitus without complication (HCC)   . Hypertension   . 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 Hyperlipidemia 02/11/2014   Past Surgical History  Procedure Laterality Date  . Abdominal hysterectomy      abdominal, no oophorectomy  . Cyst removal leg  07-2013    inner L thigh, Dr Vickki [place]uff[place], [name]   . Breast cyst excision     Family History  Problem Relation Age of Onset  . Colon cancer Neg Hx   . Breast cancer Neg Hx   . CAD Neg Hx   . Hypertension Mother   . Hypertension Father   . Heart disease Father   . Diabetes Maternal Aunt     Social History  Substance Use Topics  . Smoking status: Never Smoker   . Smokeless tobacco: Never Used  . Alcohol Use: 0.0 oz/week    0 Standard drinks or equivalent per week     Comment: wine, rare    OB History    No data available     Review of Systems  10 systems reviewed and found to be negative, except as noted in the HPI.   Allergies  Review of patient's allergies indicates no known allergies.  Home Medications   Prior to Admission medications   Medication Sig Start Date End Date Taking? Authorizing Provider  glucose blood test strip Use as instructed 02/11/14  Yes Wanda PlumpJose E Paz, MD  lisinopril (PRINIVIL,ZESTRIL) 20 MG tablet Take 1 tablet (20 mg total) by mouth daily. 10/29/14  Yes Doris Cheadleeepak Advani, MD  lovastatin (MEVACOR) 40 MG tablet Take 1 tablet (40 mg total) by mouth at bedtime. 10/29/14  Yes Doris Cheadleeepak Advani, MD  metFORMIN (GLUCOPHAGE) 1000 MG tablet Take 1 tablet (1,000 mg total) by mouth daily with breakfast. 10/29/14  Yes Deepak Advani, MD  amLODipine (NORVASC) 10 MG tablet Take 1 tablet (10 mg total) by mouth daily. 10/11/15   Sharifa Bucholz, PA-C   BP 164/89 mmHg  Pulse 85  Temp(Src) 98.2 F (36.8 C) (Oral)  Resp 18  SpO2 98% Physical Exam  Constitutional: She is oriented to person, place, and time. She appears well-developed and well-nourished. No distress.  HENT:  Head: Normocephalic and atraumatic.  Mouth/Throat:  Oropharynx is clear and moist.  Eyes: Conjunctivae and EOM are normal. Pupils are equal, round, and reactive to light.  No TTP of maxillary or frontal sinuses  No TTP or induration of temporal arteries bilaterally  Neck: Normal range of motion. Neck supple. No JVD present. No tracheal deviation present.  FROM to C-spine. Pt can touch chin to chest without discomfort. No TTP of midline cervical spine.   Cardiovascular: Normal rate, regular rhythm and intact distal pulses.   Radial pulse equal bilaterally  Pulmonary/Chest: Effort normal and  breath sounds normal. No stridor. No respiratory distress. She has no wheezes. She has no rales. She exhibits no tenderness.  Abdominal: Soft. Bowel sounds are normal. She exhibits no distension and no mass. There is no tenderness. There is no rebound and no guarding.  Musculoskeletal: Normal range of motion. She exhibits no edema or tenderness.  No calf asymmetry, superficial collaterals, palpable cords, edema, Homans sign negative bilaterally.    Neurological: She is alert and oriented to person, place, and time. No cranial nerve deficit.  II-Visual fields grossly intact. III/IV/VI-Extraocular movements intact.  Pupils reactive bilaterally. V/VII-Smile symmetric, equal eyebrow raise,  facial sensation intact VIII- Hearing grossly intact IX/X-Normal gag XI-bilateral shoulder shrug XII-midline tongue extension Motor: 5/5 bilaterally with normal tone and bulk Cerebellar: Normal finger-to-nose  and normal heel-to-shin test.   Romberg negative Ambulates with a coordinated gait   Skin: Skin is warm. She is not diaphoretic.  Psychiatric: She has a normal mood and affect.  Nursing note and vitals reviewed.   ED Course  Procedures (including critical care time) Labs Review Labs Reviewed - No data to display  Imaging Review No results found. I have personally reviewed and evaluated these images and lab results as part of my medical decision-making.   EKG Interpretation   Date/Time:  Saturday October 11 2015 12:02:05 EDT Ventricular Rate:  73 PR Interval:    QRS Duration: 91 QT Interval:  387 QTC Calculation: 427 R Axis:   -6 Text Interpretation:  Sinus rhythm Consider right atrial enlargement Low  voltage, precordial leads No previous ECGs available Confirmed by YAO  MD,  DAVID ([id number]) on 10/11/2015 1:01:07 PM      MDM   Final diagnoses:  Uncontrolled hypertension    Filed Vitals:   10/11/15 1059 10/11/15 1204 10/11/15 1215 10/11/15 1313  BP: 181/94 153/95 147/87 164/89   Pulse: 87   85  Temp: 98 F (36.7 C)   98.2 F (36.8 C)  TempSrc: Oral   Oral  Resp: 17  17 18   SpO2: 98%   98%    Medications  amLODipine (NORVASC) tablet 10 mg (10 mg Oral Given 10/11/15 1204)    Lisa Meyers is 60 y.o. female presenting with Elevated blood pressure and lightheaded sensation onset 3 weeks ago, patient thinks that this is likely secondary to her initiation of TB medications for positive PPD no signs of active TB. I think this is more likely secondary to her noncompliance with her hypertension medication, she is taking her lisinopril as directed but she ran out of amlodipine a week before the onset of the elevated blood pressure. Patient without signs of symptomatic hypertension, she says she feels a little bit lightheaded, my neuro exam is nonfocal, will check a EKG, patient has no abdominal pain, chest pain, melena, hematochezia.  EKG with no acute changes. Patient will be restarted on her amlodipine.   Evaluation does not show pathology that would require ongoing emergent intervention or  inpatient treatment. Pt is hemodynamically stable and mentating appropriately. Discussed findings and plan with patient/guardian, who agrees with care plan. All questions answered. Return precautions discussed and outpatient follow up given.        Wynetta Emery, PA-C 10/11/15 1335  Richardean Canal, MD 10/11/15 [phone]

## 2015-10-11 NOTE — ED Notes (Addendum)
Pt c/o increased BP x 1 week.  Denies pain.  Hx of HTN and compliant w/ medications.  Sts "it's been in the 180s."  Pt reports she was started on several medications r/t positive PPD test x 3 weeks ago and is concerned that is causing the increased BP.

## 2015-12-29 ENCOUNTER — Encounter: Payer: Self-pay | Admitting: Internal Medicine

## 2016-05-31 ENCOUNTER — Ambulatory Visit: Payer: 59 | Admitting: Family Medicine

## 2016-05-31 ENCOUNTER — Ambulatory Visit (INDEPENDENT_AMBULATORY_CARE_PROVIDER_SITE_OTHER): Payer: 59 | Admitting: Internal Medicine

## 2016-05-31 ENCOUNTER — Encounter: Payer: Self-pay | Admitting: Internal Medicine

## 2016-05-31 VITALS — BP 126/74 | HR 60 | Temp 98.2°F | Resp 14 | Ht 65.0 in | Wt 206.0 lb

## 2016-05-31 DIAGNOSIS — E119 Type 2 diabetes mellitus without complications: Secondary | ICD-10-CM

## 2016-05-31 DIAGNOSIS — I10 Essential (primary) hypertension: Secondary | ICD-10-CM | POA: Diagnosis not present

## 2016-05-31 DIAGNOSIS — Z09 Encounter for follow-up examination after completed treatment for conditions other than malignant neoplasm: Secondary | ICD-10-CM | POA: Insufficient documentation

## 2016-05-31 DIAGNOSIS — E785 Hyperlipidemia, unspecified: Secondary | ICD-10-CM | POA: Diagnosis not present

## 2016-05-31 DIAGNOSIS — G5603 Carpal tunnel syndrome, bilateral upper limbs: Secondary | ICD-10-CM | POA: Diagnosis not present

## 2016-05-31 LAB — LIPID PANEL
CHOL/HDL RATIO: 3
Cholesterol: 180 mg/dL (ref 0–200)
HDL: 68.6 mg/dL (ref >39.00)
LDL CALC: 101 mg/dL — AB (ref 0–99)
NONHDL: 111.71
Triglycerides: 53 mg/dL (ref 0.0–149.0)
VLDL: 10.6 mg/dL (ref 0.0–40.0)

## 2016-05-31 LAB — HEMOGLOBIN A1C: HEMOGLOBIN A1C: 6.9 % — AB (ref 4.6–6.5)

## 2016-05-31 LAB — CBC WITH DIFFERENTIAL/PLATELET
Basophils Absolute: 0 10*3/uL (ref 0.0–0.1)
Basophils Relative: 0.5 % (ref 0.0–3.0)
EOS ABS: 0 10*3/uL (ref 0.0–0.7)
Eosinophils Relative: 1.3 % (ref 0.0–5.0)
HCT: 39.8 % (ref 36.0–46.0)
Hemoglobin: 13.1 g/dL (ref 12.0–15.0)
LYMPHS ABS: 1.9 10*3/uL (ref 0.7–4.0)
Lymphocytes Relative: 58 % — ABNORMAL HIGH (ref 12.0–46.0)
MCHC: 32.9 g/dL (ref 30.0–36.0)
MCV: 91.3 fl (ref 78.0–100.0)
MONO ABS: 0.3 10*3/uL (ref 0.1–1.0)
Monocytes Relative: 8.8 % (ref 3.0–12.0)
Neutro Abs: 1 10*3/uL — ABNORMAL LOW (ref 1.4–7.7)
Platelets: 238 10*3/uL (ref 150.0–400.0)
RBC: 4.36 Mil/uL (ref 3.87–5.11)
RDW: 13.5 % (ref 11.5–15.5)
WBC: 3.3 10*3/uL — ABNORMAL LOW (ref 4.0–10.5)

## 2016-05-31 LAB — COMPREHENSIVE METABOLIC PANEL
ALT: 12 U/L (ref 0–35)
AST: 16 U/L (ref 0–37)
Albumin: 4.2 g/dL (ref 3.5–5.2)
Alkaline Phosphatase: 61 U/L (ref 39–117)
BUN: 12 mg/dL (ref 6–23)
CHLORIDE: 107 meq/L (ref 96–112)
CO2: 26 meq/L (ref 19–32)
Calcium: 9.4 mg/dL (ref 8.4–10.5)
Creatinine, Ser: 0.7 mg/dL (ref 0.40–1.20)
GFR: 108.66 mL/min (ref >60.00)
GLUCOSE: 132 mg/dL — AB (ref 70–99)
POTASSIUM: 3.8 meq/L (ref 3.5–5.1)
Sodium: 140 mEq/L (ref 135–145)
TOTAL PROTEIN: 7.6 g/dL (ref 6.0–8.3)
Total Bilirubin: 0.3 mg/dL (ref 0.2–1.2)

## 2016-05-31 LAB — TSH: TSH: 1.03 u[IU]/mL (ref 0.35–4.50)

## 2016-05-31 NOTE — Progress Notes (Signed)
Subjective:    Patient ID: Lisa Meyers, female    DOB: [birth year], 61 y.o.   MRN: [id number]  DOS:  05/31/2016 Type of visit - description : acute , here for CTS symptoms but also likes me to reassume her medical care Interval history: Long history of left hand numbness, for the last 5 years, located at the 2-3-4 fingertips, previous MD told her that she had CTS, was recommended a brace, she got one apparently too small and hurt her when use it so she quit  Symptoms are progressively worse  DM: Reports taking metformin, not ambulatory CBGs  HTN-reports taking medication, no recent ambulatory BPs High cholesterol: On statins. 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  Past Medical History:  Diagnosis Date  . Diabetes mellitus without complication (HCC)   . Hyperlipidemia 02/11/2014  . Hypertension   . Neck pain   . 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 ABDOMINAL HYSTERECTOMY     abdominal, no oophorectomy  . BREAST CYST EXCISION    . CYST REMOVAL LEG  07-2013   inner L thigh, Dr Vickki Muff, [facility]     Social History   Social History  . Marital status: Single    Spouse name: N/A  . Number of children: 1  . Years of education: N/A   Occupational History  . CNA -- [employer]     Social History Main Topics  . Smoking status: Never Smoker  . Smokeless tobacco: Never Used  . Alcohol use 0.0 oz/week     Comment: wine, rare   . Drug use: No  . Sexual activity: Not on file   Other Topics Concern  . Not on file   Social History Narrative   Born in [place]   Live in [place] x years, then [place], moved to [place] 2015   Lives w/ sister       Allergies as of 05/31/2016   No Known Allergies     Medication List         Accurate as of 05/31/16  5:17 PM. Always use your most recent med list.          amLODipine 10 MG tablet Commonly known as:  NORVASC Take 1 tablet (10 mg total) by mouth daily.   glucose blood test strip Use as instructed   lisinopril 20 MG tablet Commonly known as:  PRINIVIL,ZESTRIL Take 1 tablet (20 mg total) by mouth daily.   lovastatin 20 MG tablet Commonly known as:  MEVACOR Take 20 mg by mouth at bedtime.   metFORMIN 1000 MG tablet Commonly known as:  GLUCOPHAGE Take 1 tablet (1,000 mg total) by mouth daily with breakfast.          Objective:   Physical Exam BP 126/74 (BP Location: Left Arm, Patient Position: Sitting, Cuff Size: Normal)   Pulse 60   Temp 98.2 F (36.8 C) (Oral)   Resp 14   Ht 5\' 5"  (1.651 m)   Wt 206 lb (93.4 kg)   SpO2 97%   BMI 34.28 kg/m  General:   Well developed, well nourished . NAD.  HEENT:  Normocephalic . Face symmetric, atraumatic. Neck: Full range of motion Lungs:  CTA B Normal respiratory effort, no  intercostal retractions, no accessory muscle use. Heart: RRR,  no murmur.  No pretibial edema bilaterally  MSK: Hands, wrist and labels normal to inspection and palpation  Percussion of palmar aspect of L wrist caused  downstream pain. Skin: Not pale. Not jaundice Neurologic:  alert & oriented X3.  Speech normal, gait appropriate for age and unassisted DTRs symmetric except for a decrease right knee jerk Psych--  Cognition and judgment appear intact.  Cooperative with normal attention span and concentration.  Behavior appropriate. No anxious or depressed appearing.      Assessment & Plan:    Assessment DM HTN Hyperlipidemia + PPD 1990s + PPD 09/03/2015, 16 mm, x-ray negative, [facility]  PLAN: The patient has been getting her care from [place] doctor in [place], request made to start taking care of her chronic medical problems again. CTS: Sx c/w  CTS,  encouraged  consistent use of a brace. Check a TSH, refer to ortho  DM:  Reports taking metformin daily, check A1c, RF w/ results HTN: Reports amlodipine and lisinopril daily, BP today is very good, no recent ambulatory BPs, check a CMP and CBC. Refill medications with results High cholesterol: On lovastatin 20 mg daily, check labs, refill will results RTC 3 months, CPX

## 2016-05-31 NOTE — Assessment & Plan Note (Signed)
The patient has been getting her care from a doctor in [place], request made to start taking care of her chronic medical problems again. CTS: Sx c/w  CTS, encouraged  consistent use of a brace. Check a TSH, refer to ortho  DM:  Reports taking metformin daily, check A1c, RF w/ results HTN: Reports amlodipine and lisinopril daily, BP today is very good, no recent ambulatory BPs, check a CMP and CBC. Refill medications with results High cholesterol: On lovastatin 20 mg daily, check labs, refill will results RTC 3 months, CPX

## 2016-05-31 NOTE — Patient Instructions (Signed)
GO TO THE LAB : Get the blood work     GO TO THE FRONT DESK Schedule your next appointment for a  physical exam in 3 months, fasting   We'll refill your medications once the blood work come back

## 2016-05-31 NOTE — Progress Notes (Signed)
Pre visit review using our clinic review tool, if applicable. No additional management support is needed unless otherwise documented below in the visit note. 

## 2016-06-01 MED ORDER — LISINOPRIL 20 MG PO TABS: 20.0000 mg | ORAL_TABLET | Freq: Every day | ORAL | 5 refills | Status: DC

## 2016-06-01 MED ORDER — METFORMIN HCL 1000 MG PO TABS: 1000.0000 mg | ORAL_TABLET | Freq: Every day | ORAL | 5 refills | Status: DC

## 2016-06-01 MED ORDER — AMLODIPINE BESYLATE 10 MG PO TABS: 10.0000 mg | ORAL_TABLET | Freq: Every day | ORAL | 5 refills | Status: DC

## 2016-06-01 MED ORDER — LOVASTATIN 20 MG PO TABS: 20.0000 mg | ORAL_TABLET | Freq: Every day | ORAL | 5 refills | Status: DC

## 2016-06-01 NOTE — Addendum Note (Signed)
Addended byConrad Wewoka: Willi Borowiak D on: 06/01/2016 05:29 PM   Modules accepted: Orders

## 2016-07-01 LAB — HM DIABETES EYE EXAM

## 2016-07-08 ENCOUNTER — Encounter: Payer: Self-pay | Admitting: Internal Medicine

## 2016-08-30 ENCOUNTER — Ambulatory Visit: Payer: 59 | Admitting: Internal Medicine

## 2016-11-12 ENCOUNTER — Ambulatory Visit: Payer: 59 | Admitting: Internal Medicine

## 2016-11-26 ENCOUNTER — Encounter: Payer: Self-pay | Admitting: Internal Medicine

## 2016-11-26 ENCOUNTER — Ambulatory Visit (INDEPENDENT_AMBULATORY_CARE_PROVIDER_SITE_OTHER): Payer: 59 | Admitting: Internal Medicine

## 2016-11-26 VITALS — BP 124/80 | HR 78 | Temp 97.5°F | Resp 14 | Ht 65.0 in | Wt 204.0 lb

## 2016-11-26 DIAGNOSIS — Z23 Encounter for immunization: Secondary | ICD-10-CM | POA: Diagnosis not present

## 2016-11-26 DIAGNOSIS — Z0000 Encounter for general adult medical examination without abnormal findings: Secondary | ICD-10-CM | POA: Insufficient documentation

## 2016-11-26 DIAGNOSIS — E119 Type 2 diabetes mellitus without complications: Secondary | ICD-10-CM

## 2016-11-26 DIAGNOSIS — I10 Essential (primary) hypertension: Secondary | ICD-10-CM | POA: Diagnosis not present

## 2016-11-26 DIAGNOSIS — Z1231 Encounter for screening mammogram for malignant neoplasm of breast: Secondary | ICD-10-CM

## 2016-11-26 DIAGNOSIS — M545 Low back pain: Secondary | ICD-10-CM | POA: Diagnosis not present

## 2016-11-26 DIAGNOSIS — Z01419 Encounter for gynecological examination (general) (routine) without abnormal findings: Secondary | ICD-10-CM

## 2016-11-26 DIAGNOSIS — Z1159 Encounter for screening for other viral diseases: Secondary | ICD-10-CM

## 2016-11-26 LAB — BASIC METABOLIC PANEL
BUN: 16 mg/dL (ref 6–23)
CALCIUM: 10 mg/dL (ref 8.4–10.5)
CHLORIDE: 103 meq/L (ref 96–112)
CO2: 30 mEq/L (ref 19–32)
CREATININE: 0.78 mg/dL (ref 0.40–1.20)
GFR: 95.76 mL/min (ref >60.00)
Glucose, Bld: 121 mg/dL — ABNORMAL HIGH (ref 70–99)
Potassium: 3.5 mEq/L (ref 3.5–5.1)
Sodium: 139 mEq/L (ref 135–145)

## 2016-11-26 LAB — HEMOGLOBIN A1C: HEMOGLOBIN A1C: 6.7 % — AB (ref 4.6–6.5)

## 2016-11-26 MED ORDER — LIDOCAINE 5 % EX PTCH: 1.0000 | MEDICATED_PATCH | CUTANEOUS | 1 refills | Status: DC

## 2016-11-26 NOTE — Assessment & Plan Note (Signed)
PLAN: DM: Currently on metformin, check a A1c. Extensively discussed diet. Recommend to visit the ADA, [facility] websites HTN: Seems well-controlled with amlodipine-lisinopril. Check a BMP High cholesterol: Last LDL satisfactory, continue lovastatin. Pain, left leg: See description above, sounds like a neuropathic pain ; there is no classic peripheral neuropathy symptoms. Recommend a lidocaine patch as needed. MSK: Back and feet pain on-off, likely related to long working hours. Rec  good posture when lifting, compression stockings and good support shoes. Primary care: Td today. Refer to gynecology. Check a hep C ; declined HIV. RTC 4 months, CPX

## 2016-11-26 NOTE — Progress Notes (Signed)
Subjective:    Patient ID: Lisa Meyers, female    DOB: 1955-08-02, 61 y.o.   MRN: [id number]  DOS:  11/26/2016 Type of visit - description : Routine visit Interval history: HTN: Good compliance of medication, BPs rarely check but usually normal DM: Good compliance of medication, CBGs rarely check it. This morning was 111. High cholesterol: On statins. Last FLP satisfactory   Review of Systems Complains of aches and pains, mostly at the back and feet, works in a nursing home, does a lot of walking and lifting. Also, has a stabbing pain at the lateral aspect of the left leg sometimes at night. The pain is sharp, not associated with swelling, redness. She actually denies any feet burning or neuropathy type symptoms.  Past Medical History:  Diagnosis Date  . Diabetes mellitus without complication (HCC)   . Hyperlipidemia 02/11/2014  . Hypertension   . Neck pain   . 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 ABDOMINAL HYSTERECTOMY     abdominal, no oophorectomy  . BREAST CYST EXCISION    . CYST REMOVAL LEG  07-2013   inner L thigh, Dr Vickki Muff, Claris Gower     Social History   Social History  . Marital status: Single    Spouse name: N/A  . Number of children: 1  . Years of education: N/A   Occupational History  . CNA -- [employer]     Social History Main Topics  . Smoking status: Never Smoker  . Smokeless tobacco: Never Used  . Alcohol use 0.0 oz/week     Comment: wine, rare   . Drug use: No  . Sexual activity: Not on file   Other Topics Concern  . Not on file   Social History Narrative   Born in [place]   Live in [place] x years, then [place], moved to [place] 2015   Lives w/ sister       Allergies as of 11/26/2016   No Known Allergies     Medication List       Accurate as of 11/26/16  4:10 PM. Always use your most recent med list.          amLODipine 10 MG tablet Commonly known as:  NORVASC Take 1 tablet  (10 mg total) by mouth daily.   glucose blood test strip Use as instructed   lidocaine 5 % Commonly known as:  LIDODERM Place 1 patch onto the skin daily. Remove & Discard patch within 12 hours or as directed by MD   lisinopril 20 MG tablet Commonly known as:  PRINIVIL,ZESTRIL Take 1 tablet (20 mg total) by mouth daily.   lovastatin 20 MG tablet Commonly known as:  MEVACOR Take 1 tablet (20 mg total) by mouth at bedtime.   metFORMIN 1000 MG tablet Commonly known as:  GLUCOPHAGE Take 1 tablet (1,000 mg total) by mouth daily with breakfast.          Objective:   Physical Exam  Skin:      BP 124/80 (BP Location: Left Arm, Patient Position: Sitting, Cuff Size: Normal)   Pulse 78   Temp (!) 97.5 F (36.4 C) (Oral)   Resp 14   Ht 5\' 5"  (1.651 m)   Wt 204 lb (92.5 kg)   SpO2 94%   BMI 33.95 kg/m  General:   Well developed, well nourished . NAD.  HEENT:  Normocephalic . Face symmetric, atraumatic Lungs:  CTA  B Normal respiratory effort, no intercostal retractions, no accessory muscle use. Heart: RRR,  no murmur.  No pretibial edema bilaterally  Skin: Not pale. Not jaundice DIABETIC FEET EXAM: No lower extremity edema Normal pedal pulses bilaterally Skin normal, nails normal, no calluses Pinprick examination of the feet normal. Neurologic:  alert & oriented X3.  Speech normal, gait appropriate for age and unassisted Psych--  Cognition and judgment appear intact.  Cooperative with normal attention span and concentration.  Behavior appropriate. No anxious or depressed appearing.      Assessment & Plan:   Assessment DM HTN Hyperlipidemia + PPD 1990s + PPD 09/03/2015, 16 mm, x-ray negative, completed 3 months of INH 12/11/2015 at [facility]   health department  PLAN: DM: Currently on metformin, check a A1c. Extensively discussed diet. Recommend to visit the ADA, [facility] websites HTN: Seems well-controlled with amlodipine-lisinopril. Check a  BMP High cholesterol: Last LDL satisfactory, continue lovastatin. Pain, left leg: See description above, sounds like a neuropathic pain ; there is no classic peripheral neuropathy symptoms. Recommend a lidocaine patch as needed. MSK: Back and feet pain on-off, likely related to long working hours. Rec  good posture when lifting, compression stockings and good support shoes. Primary care: Td today. Refer to gynecology. Check a hep C ; declined HIV. RTC 4 months, CPX

## 2016-11-26 NOTE — Progress Notes (Signed)
Pre visit review using our clinic review tool, if applicable. No additional management support is needed unless otherwise documented below in the visit note. 

## 2016-11-26 NOTE — Assessment & Plan Note (Signed)
Td 11-2016 Referred to gynecology Will do a formal  CPX in 4 months

## 2016-11-26 NOTE — Patient Instructions (Signed)
GO TO THE LAB : Get the blood work     GO TO THE FRONT DESK Schedule your next appointment for a  routine checkup in 4 months    If you need more information about a healthy diet, visit: The American diabetes Association  Http://www.diabetes.org  The visit [facility] website   http://www.[facility].org

## 2016-11-27 LAB — HEPATITIS C ANTIBODY: HCV AB: NONREACTIVE

## 2016-12-09 ENCOUNTER — Telehealth: Payer: Self-pay | Admitting: Internal Medicine

## 2016-12-09 NOTE — Telephone Encounter (Addendum)
Relation to pt: self  Call back [phone] Pharmacy: [facility] [address] - [place], [place] - [id number] [phone] (Phone) [phone] (Fax)   D.O.D Dr. Laury AxonLowne  Reason for call:  Patient states lidocaine (LIDODERM) 5 % , amLODipine (NORVASC) 10 MG tablet, lisinopril (PRINIVIL,ZESTRIL) 20 MG tablet, , lovastatin (MEVACOR) 20 MG tablet, metFORMIN (GLUCOPHAGE) 1000 MG tablet

## 2016-12-10 MED ORDER — LISINOPRIL 20 MG PO TABS: 20.0000 mg | ORAL_TABLET | Freq: Every day | ORAL | 5 refills | Status: DC

## 2016-12-10 MED ORDER — LOVASTATIN 20 MG PO TABS: 20.0000 mg | ORAL_TABLET | Freq: Every day | ORAL | 5 refills | Status: DC

## 2016-12-10 MED ORDER — AMLODIPINE BESYLATE 10 MG PO TABS: 10.0000 mg | ORAL_TABLET | Freq: Every day | ORAL | 5 refills | Status: DC

## 2016-12-10 MED ORDER — METFORMIN HCL 1000 MG PO TABS: 1000.0000 mg | ORAL_TABLET | Freq: Every day | ORAL | 5 refills | Status: DC

## 2016-12-10 NOTE — Telephone Encounter (Signed)
Rxs sent. Looks like Lidocaine was filled on 8/17.

## 2017-04-01 ENCOUNTER — Ambulatory Visit (INDEPENDENT_AMBULATORY_CARE_PROVIDER_SITE_OTHER): Payer: 59 | Admitting: Internal Medicine

## 2017-04-01 ENCOUNTER — Encounter: Payer: Self-pay | Admitting: Internal Medicine

## 2017-04-01 VITALS — BP 126/80 | HR 75 | Temp 98.2°F | Resp 14 | Ht 65.0 in | Wt 205.0 lb

## 2017-04-01 DIAGNOSIS — M79671 Pain in right foot: Secondary | ICD-10-CM | POA: Diagnosis not present

## 2017-04-01 DIAGNOSIS — R21 Rash and other nonspecific skin eruption: Secondary | ICD-10-CM

## 2017-04-01 MED ORDER — MUPIROCIN 2 % EX OINT: 1.0000 "application " | TOPICAL_OINTMENT | Freq: Two times a day (BID) | CUTANEOUS | 0 refills | Status: DC

## 2017-04-01 NOTE — Progress Notes (Signed)
Subjective:    Patient ID: Lisa Meyers, female    DOB: [birth year], 61 y.o.   MRN: [id number]  DOS:  04/01/2017 Type of visit - description : acute Interval history: C/o pain at the lateral aspect of the right calf , sx happen after working several hours standing up. Also some right foot pain (points to the lateral aspect of the foot) when she walks. Has a rash on and off on the legs, both sides, they do not hurt they itch sometimes.  Concerned because after she scratched the skin turns dark.    Review of Systems Back pain is not a major issue, today denies left leg pain. Denies burning or numbness on her feet at night  Past Medical History:  Diagnosis Date  . Diabetes mellitus without complication (HCC)   . Hyperlipidemia 02/11/2014  . Hypertension   . Neck pain   . 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 ABDOMINAL HYSTERECTOMY     abdominal, no oophorectomy  . BREAST CYST EXCISION    . CYST REMOVAL LEG  07-2013   inner L thigh, Dr Vickki MuffWeston, Lisa Meyers     Social History   Socioeconomic History  . Marital status: Single    Spouse name: Not on file  . Number of children: 1  . Years of education: Not on file  . Highest education level: Not on file  Social Needs  . Financial resource strain: Not on file  . Food insecurity - worry: Not on file  . Food insecurity - inability: Not on file  . Transportation needs - medical: Not on file  . Transportation needs - non-medical: Not on file  Occupational History  . 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 Smoking status: Never Smoker  . Smokeless tobacco: Never Used  Substance and Sexual Activity  . Alcohol use: Yes    Alcohol/week: 0.0 oz    Comment: wine, rare   . Drug use: No  . Sexual activity: Not on file  Other Topics Concern  . Not on file  Social History Narrative   Born in [place]   Live in [place] years, then [place], moved [place]   Lives w/  sister       Allergies as of 04/01/2017   No Known Allergies     Medication List        Accurate as of 04/01/17 11:26 AM. Always use your most recent med list.          amLODipine 10 MG tablet Commonly known as:  NORVASC Take 1 tablet (10 mg total) by mouth daily.   glucose blood test strip Use as instructed   lidocaine 5 % Commonly known as:  LIDODERM Place 1 patch onto the skin daily. Remove & Discard patch within 12 hours or as directed by MD   lisinopril 20 MG tablet Commonly known as:  PRINIVIL,ZESTRIL Take 1 tablet (20 mg total) by mouth daily.   lovastatin 20 MG tablet Commonly known as:  MEVACOR Take 1 tablet (20 mg total) by mouth at bedtime.   metFORMIN 1000 MG tablet Commonly known as:  GLUCOPHAGE Take 1 tablet (1,000 mg total) by mouth daily with breakfast.          Objective:   Physical Exam  Musculoskeletal:       Legs:  BP 126/80 (BP Location: Left Arm, Patient Position: Sitting, Cuff Size: Normal)  Pulse 75   Temp 98.2 F (36.8 C) (Oral)   Resp 14   Ht 5\' 5"  (1.651 m)   Wt 205 lb (93 kg)   SpO2 97%   BMI 34.11 kg/m  General:   Well developed, well nourished . NAD.  HEENT:  Normocephalic . Face symmetric, atraumatic Feet exam: Normal pedal pulses, no edema, pes planus noted. Skin:  Has two minute (<91mm) yellow blister at the R leg. Several areas of postinflammatory hyperpigmentation on both legs noted. Neurologic:  alert & oriented X3.  Speech normal, gait appropriate for age and unassisted Psych--  Cognition and judgment appear intact.  Cooperative with normal attention span and concentration.  Behavior appropriate. No anxious or depressed appearing.      Assessment & Plan:   Assessment DM HTN Hyperlipidemia + PPD 1990s + PPD 09/03/2015, 16 mm, x-ray negative, [facility]   [facility]  PLAN: Rash: Very small yellow pustules , DDX includes shingles but she had the same  lesions on and off in both legs, she works at the nursing home, MRSA?  Usually lesions self resolved.  Recommend observation and use mupirocin at the nose for 1 week.  "Dark skin" likely postinflammatory hyperpigmentation.. Pain, right foot, right leg: Refer to podiatry. Schedule a follow-up in 1 month

## 2017-04-01 NOTE — Progress Notes (Signed)
Pre visit review using our clinic review tool, if applicable. No additional management support is needed unless otherwise documented below in the visit note. 

## 2017-04-01 NOTE — Patient Instructions (Signed)
Schedule a follow-up for next month for your routine care  We are referring you to the podiatrist for your foot pain  Use Bactroban to your nose twice a day for 1 week

## 2017-04-03 NOTE — Assessment & Plan Note (Signed)
Rash: Very small yellow pustules , DDX includes shingles but she had the same lesions on and off in both legs, she works at the nursing home, MRSA?  Usually lesions self resolved.  Recommend observation and use mupirocin at the nose for 1 week.  "Dark skin" likely postinflammatory hyperpigmentation.. Pain, right foot, right leg: Refer to podiatry. Schedule a follow-up in 1 month

## 2017-04-08 ENCOUNTER — Encounter: Payer: Self-pay | Admitting: Podiatry

## 2017-04-08 ENCOUNTER — Ambulatory Visit (INDEPENDENT_AMBULATORY_CARE_PROVIDER_SITE_OTHER): Payer: 59 | Admitting: Podiatry

## 2017-04-08 DIAGNOSIS — M7671 Peroneal tendinitis, right leg: Secondary | ICD-10-CM | POA: Diagnosis not present

## 2017-04-08 DIAGNOSIS — M2141 Flat foot [pes planus] (acquired), right foot: Secondary | ICD-10-CM

## 2017-04-08 DIAGNOSIS — E119 Type 2 diabetes mellitus without complications: Secondary | ICD-10-CM

## 2017-04-08 DIAGNOSIS — M2142 Flat foot [pes planus] (acquired), left foot: Secondary | ICD-10-CM

## 2017-04-08 NOTE — Progress Notes (Signed)
This patient presented to the office with chief complaint of pain in her right lower leg.  She points to an area on the outside of her right leg.  She says this area becomes extremely painful due to the amount of hours that she is required to do at work.  She states she is not having any pain or discomfort in her feet.  She presents the office today for an evaluation and treatment     Diagnosis  Peroneal tendinitis right foot. Pes planus.   Discussed with this patient her pathology. Told this patient she has a problem in her lower leg, which is out of the scope of practice of podiatry.  I told her it sounds as if she has developed a tendinitis in her leg due to her flatfootedness.  I did tell her she needs to be seen by the emergency care or her medical doctor.  In the meantime, I suggested she attempt to wear the power step insoles sold by this office in her shoes when she works.   Lisa GuntherGregory Wildon Meyers DPM

## 2017-04-28 ENCOUNTER — Other Ambulatory Visit: Payer: Self-pay | Admitting: Internal Medicine

## 2017-04-28 MED ORDER — METFORMIN HCL 1000 MG PO TABS: 1000.0000 mg | ORAL_TABLET | Freq: Every day | ORAL | 5 refills | Status: DC

## 2017-04-28 MED ORDER — GLUCOSE BLOOD VI STRP: ORAL_STRIP | 12 refills | Status: DC

## 2017-04-28 MED ORDER — AMLODIPINE BESYLATE 10 MG PO TABS: 10.0000 mg | ORAL_TABLET | Freq: Every day | ORAL | 5 refills | Status: DC

## 2017-04-28 MED ORDER — LIDOCAINE 5 % EX PTCH: 1.0000 | MEDICATED_PATCH | CUTANEOUS | 1 refills | Status: DC

## 2017-04-28 MED ORDER — MUPIROCIN 2 % EX OINT: 1.0000 "application " | TOPICAL_OINTMENT | Freq: Two times a day (BID) | CUTANEOUS | 0 refills | Status: DC

## 2017-04-28 MED ORDER — LOVASTATIN 20 MG PO TABS: 20.0000 mg | ORAL_TABLET | Freq: Every day | ORAL | 5 refills | Status: DC

## 2017-04-28 MED ORDER — LISINOPRIL 20 MG PO TABS: 20.0000 mg | ORAL_TABLET | Freq: Every day | ORAL | 5 refills | Status: DC

## 2017-04-28 NOTE — Telephone Encounter (Signed)
Copied from CRM [id number]. Topic: Quick Communication - Rx Refill/Question >> Apr 28, 2017  1:03 PM Alexander BergeronBarksdale, Harvey B wrote: Medication: ALL OF THEM, contact pt to advise   Has the patient contacted their pharmacy? Yes.     (Agent: If no, request that the patient contact the pharmacy for the refill.)   Preferred Pharmacy (with phone number or street name): [facility], [address] [place]   Agent: Please be advised that RX refills may take up to 3 business days. We ask that you follow-up with your pharmacy.

## 2017-04-28 NOTE — Telephone Encounter (Signed)
Pt called [facility] requesting refills, however didn't specify which ones. I called Pt and she informed me that she was no longer happy with [facility] and [facility]. I adjusted her chart and refilled Rx she stated she ran out of.(Amlodipine, lidocaine, lisinopril, lovastatin, metformin and bactroban.)

## 2017-08-08 ENCOUNTER — Ambulatory Visit: Payer: Self-pay | Admitting: *Deleted

## 2017-08-08 NOTE — Telephone Encounter (Addendum)
Pt states she has experienced swelling to bilateral thighs and buttocks with the left side being greater than the right for the past 3-4 days. Pt states that her skin feels soft and she can see "little pockets" in her buttocks. Pt states she does wear compression stocking while at work because she has to stand up a lot but states she has no had swelling like this before.Pt reports that her BP was been approximately 125/60 and she has not been taking her medications like she is supposed to. Pt states that she has been taking one BP med in the morning and one in the evening instead of taking them both in the morning. Pt denies any redness, pain or weeping from the legs. Pt denies the swelling going down to her ankles and feet.Pt denies any shortness of breath or chest pain. Pt also voicing that her stomach feels tight but has no pain and has been able to eat, urinate and have bowel movements with no issue. 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 Pt informed that no appts available with PCP on tomorrow but she could be seen by another provider within the office. Pt wants to wait until Wed to be seen by PCP, Dr. Drue Novel on Wed, 08/10/17. Pt advised to call the office is symptoms worsen before seen for appt. Pt verbalized understanding.   Reason for Disposition . [1] MODERATE leg swelling (e.g., swelling extends up to knees) AND [2] new onset or worsening  Answer Assessment - Initial Assessment Questions 1. ONSET: "When did the swelling start?" (e.g., minutes, hours, days)     3-4 days ago 2. LOCATION: "What part of the leg is swollen?"  "Are both legs swollen or just one leg?"     Bilateral thighs and buttocks 3. SEVERITY: "How bad is the swelling?" (e.g., localized; mild, moderate, severe)  - Localized - small area of swelling localized to one leg  - MILD pedal edema - swelling limited to foot and ankle, pitting edema < 1/4 inch (6 mm) deep, rest and elevation eliminate  most or all swelling  - MODERATE edema - swelling of lower leg to knee, pitting edema > 1/4 inch (6 mm) deep, rest and elevation only partially reduce swelling  - SEVERE edema - swelling extends above knee, facial or hand swelling present      Moderate in thighs and buttocks also in stomach 4. REDNESS: "Does the swelling look red or infected?"     No 5. PAIN: "Is the swelling painful to touch?" If so, ask: "How painful is it?"   (Scale 1-10; mild, moderate or severe)     No 6. FEVER: "Do you have a fever?" If so, ask: "What is it, how was it measured, and when did it start?"      NO 7. CAUSE: "What do you think is causing the leg swelling?"     unsure 8. MEDICAL HISTORY: "Do you have a history of heart failure, kidney disease, liver failure, or cancer?"     HTN,DM 9. RECURRENT SYMPTOM: "Have you had leg swelling before?" If so, ask: "When was the last time?" "What happened that time?"     No 10. OTHER SYMPTOMS: "Do you have any other symptoms?" (e.g., chest pain, difficulty breathing)       No  11. PREGNANCY: "Is there any chance you are pregnant?" "When was your last menstrual period?"       No, hysterctomy  Protocols used: LEG SWELLING AND EDEMA-A-AH

## 2017-08-10 ENCOUNTER — Encounter: Payer: Self-pay | Admitting: Internal Medicine

## 2017-08-10 ENCOUNTER — Ambulatory Visit (INDEPENDENT_AMBULATORY_CARE_PROVIDER_SITE_OTHER): Payer: Self-pay | Admitting: Internal Medicine

## 2017-08-10 VITALS — BP 122/76 | HR 72 | Temp 97.6°F | Resp 16 | Ht 65.0 in | Wt 205.5 lb

## 2017-08-10 DIAGNOSIS — R600 Localized edema: Secondary | ICD-10-CM

## 2017-08-10 DIAGNOSIS — R197 Diarrhea, unspecified: Secondary | ICD-10-CM

## 2017-08-10 MED ORDER — LISINOPRIL 20 MG PO TABS: 20.0000 mg | ORAL_TABLET | Freq: Every day | ORAL | 1 refills | Status: DC

## 2017-08-10 MED ORDER — LOVASTATIN 20 MG PO TABS: 20.0000 mg | ORAL_TABLET | Freq: Every day | ORAL | 1 refills | Status: DC

## 2017-08-10 MED ORDER — METFORMIN HCL 1000 MG PO TABS: 1000.0000 mg | ORAL_TABLET | Freq: Every day | ORAL | 1 refills | Status: DC

## 2017-08-10 MED ORDER — AMLODIPINE BESYLATE 10 MG PO TABS: 10.0000 mg | ORAL_TABLET | Freq: Every day | ORAL | 1 refills | Status: DC

## 2017-08-10 NOTE — Progress Notes (Signed)
Pre visit review using our clinic review tool, if applicable. No additional management support is needed unless otherwise documented below in the visit note. 

## 2017-08-10 NOTE — Progress Notes (Signed)
Subjective:    Patient ID: Lisa Meyers, female    DOB: 08-21-1955, 62 y.o.   MRN: [id number]  DOS:  08/10/2017 Type of visit - description : Acute visit Interval history: See phone note. For the last 1 or 2 weeks have noted some swelling around the buttocks/hip area, worse on the left.  Has noted some "lumps" there. 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  She herself developed diarrhea last week, to 3 episodes associated with some nausea vomiting. Is better now, stools are formed. Would like me to check her abdomen, it felt slightly hard in the epigastric area , no  pain  Wt Readings from Last 3 Encounters:  08/10/17 205 lb 8 oz (93.2 kg)  04/01/17 205 lb (93 kg)  11/26/16 204 lb (92.5 kg)    Review of Systems  No fever chills No chest pain no difficulty breathing No blood in the stools Mild distal lower extremity edema, she uses compression stockings   Past Medical History:  Diagnosis Date  . Diabetes mellitus without complication (HCC)   . Hyperlipidemia 02/11/2014  . Hypertension   . Neck pain   . 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 ABDOMINAL HYSTERECTOMY     abdominal, no oophorectomy  . BREAST CYST EXCISION    . CYST REMOVAL LEG  07-2013   inner L thigh, Dr Vickki Muff, Claris Gower     Social History   Socioeconomic History  . Marital status: Single    Spouse name: Not on file  . Number of children: 1  . Years of education: Not on file  . Highest education level: Not on file  Occupational History  . Occupation: CNA -- Health and safety inspector  . Financial resource strain: Not on file  . Food insecurity:    Worry: Not on file    Inability: Not on file  . Transportation needs:    Medical: Not on file    Non-medical: Not on file  Tobacco Use  . Smoking status: Never Smoker  . Smokeless tobacco: Never Used  Substance and Sexual  Activity  . Alcohol use: Yes    Alcohol/week: 0.0 oz    Comment: wine, rare   . Drug use: No  . Sexual activity: Not on file  Lifestyle  . Physical activity:    Days per week: Not on file    Minutes per session: Not on file  . Stress: Not on file  Relationships  . Social connections:    Talks on phone: Not on file    Gets together: Not on file    Attends religious service: Not on file    Active member of club or organization: Not on file    Attends meetings of clubs or organizations: Not on file    Relationship status: Not on file  . Intimate partner violence:    Fear of current or ex partner: Not on file    Emotionally abused: Not on file    Physically abused: Not on file    Forced sexual activity: Not on file  Other Topics Concern  . Not on file  Social History Narrative   Born in [place]   Live in [place] x years, then [place], moved to [place]   Lives w/ sister       Allergies as of 08/10/2017   No  Known Allergies     Medication List        Accurate as of 08/10/17 11:59 PM. Always use your most recent med list.          amLODipine 10 MG tablet Commonly known as:  NORVASC Take 1 tablet (10 mg total) by mouth daily.   glucose blood test strip Use as instructed   lidocaine 5 % Commonly known as:  LIDODERM Place 1 patch onto the skin daily. Remove & Discard patch within 12 hours or as directed by MD   lisinopril 20 MG tablet Commonly known as:  PRINIVIL,ZESTRIL Take 1 tablet (20 mg total) by mouth daily.   lovastatin 20 MG tablet Commonly known as:  MEVACOR Take 1 tablet (20 mg total) by mouth at bedtime.   metFORMIN 1000 MG tablet Commonly known as:  GLUCOPHAGE Take 1 tablet (1,000 mg total) by mouth daily with breakfast.   mupirocin ointment 2 % Commonly known as:  BACTROBAN Place 1 application into the nose 2 (two) times daily.          Objective:   Physical Exam BP 122/76 (BP Location: Left Arm, Patient Position: Sitting, Cuff Size:  Normal)   Pulse 72   Temp 97.6 F (36.4 C) (Oral)   Resp 16   Ht  (1.651 m)   Wt 205 lb 8 oz (93.2 kg)   SpO2 96%   BMI 34.20 kg/m  General:   Well developed, well nourished . NAD.  HEENT:  Normocephalic . Face symmetric, atraumatic Lower extremity: No  edema at the pretibial or peri-ankle area Gluteal and hip areas: No rash, redness, swelling or TTP. The area has dimples consistent with cellulite Abdomen:  Not distended, soft, non-tender. No rebound or rigidity.   Skin: Not pale. Not jaundice Neurologic:  alert & oriented X3.  Speech normal, gait appropriate for age and unassisted Psych--  Cognition and judgment appear intact.  Cooperative with normal attention span and concentration.  Behavior appropriate. No anxious or depressed appearing.     Assessment & Plan:    Assessment DM HTN Hyperlipidemia + PPD 1990s + PPD 09/03/2015, 16 mm, x-ray negative, completed 3 months of INH 12/11/2015 at [facility]   health department  PLAN: Diarrhea: Resolved. Edema, leg pain: No objective evidence of fluid retention, no pathological findings at the skin of the buttocks, simply seems to be cellulite. Due for a CPX, encouraged to schedule

## 2017-08-10 NOTE — Patient Instructions (Addendum)
Please to schedule a physical exam at your earliest convenience, fasting

## 2017-08-11 NOTE — Assessment & Plan Note (Signed)
Diarrhea: Resolved. Edema, leg pain: No objective evidence of fluid retention, no pathological findings at the skin of the buttocks, simply seems to be cellulite. Due for a CPX, encouraged to schedule

## 2017-09-27 ENCOUNTER — Other Ambulatory Visit: Payer: Self-pay | Admitting: Internal Medicine

## 2017-09-28 ENCOUNTER — Telehealth: Payer: Self-pay | Admitting: Internal Medicine

## 2017-09-28 NOTE — Telephone Encounter (Signed)
Copied from CRM [id number]. Topic: Quick Communication - See Telephone Encounter >> Sep 28, 2017 10:39 AM Tamela OddiMartin, [place], NT wrote: CRM for notification. See Telephone encounter for: 09/28/17. Patient called and states that she was prescribed mupirocin ointment (BACTROBAN) 2 %  and was told to apply this to her nostril. Patient states that she picked up her refill of mupirocin ointment (BACTROBAN) 2 % this time  and the directions stated to Apply to the affected area 2x a day . Patient states her infected area is on her skin. She is unsure why she was told to apply to her nostril the first time. Patient is needing clarification. CB# [phone]

## 2017-09-29 NOTE — Telephone Encounter (Signed)
LMOM informing Pt of PCP recommendations. Instructed to call if questions/concerns.  

## 2017-09-29 NOTE — Telephone Encounter (Signed)
Please advise 

## 2017-09-29 NOTE — Telephone Encounter (Signed)
I prescribed Bactroban before but that is not a long-term medication, if she has a skin infection she needs to be seen.

## 2017-10-25 LAB — HM DIABETES EYE EXAM

## 2017-11-03 ENCOUNTER — Encounter: Payer: Self-pay | Admitting: Internal Medicine

## 2017-12-19 ENCOUNTER — Ambulatory Visit (HOSPITAL_COMMUNITY)
Admission: EM | Admit: 2017-12-19 | Discharge: 2017-12-19 | Disposition: A | Discharge status: Home / Self Care | Payer: Self-pay | Attending: Family Medicine | Admitting: Family Medicine

## 2017-12-19 ENCOUNTER — Encounter (HOSPITAL_COMMUNITY): Payer: Self-pay | Admitting: Emergency Medicine

## 2017-12-19 DIAGNOSIS — R609 Edema, unspecified: Secondary | ICD-10-CM

## 2017-12-19 MED ORDER — METFORMIN HCL 1000 MG PO TABS: 1000.0000 mg | ORAL_TABLET | Freq: Every day | ORAL | 1 refills | Status: DC

## 2017-12-19 MED ORDER — LISINOPRIL-HYDROCHLOROTHIAZIDE 20-12.5 MG PO TABS: 1.0000 | ORAL_TABLET | Freq: Two times a day (BID) | ORAL | 1 refills | Status: DC

## 2017-12-19 MED ORDER — LOVASTATIN 20 MG PO TABS: 20.0000 mg | ORAL_TABLET | Freq: Every day | ORAL | 1 refills | Status: DC

## 2017-12-19 NOTE — ED Triage Notes (Signed)
Pt c/o bilateral knee pain, states theyh feel "swollen". Pt denies injury, states shes on her feet a lot at work.

## 2017-12-19 NOTE — Discharge Instructions (Addendum)
Continue taking your metformin once a day.  This is for early diabetes. Continue taking your Mevacor once a day.  This is to lower your cholesterol. Take the blood pressure medicine lisinopril/hydrochlorothiazide twice a day. Check your blood pressure in 2 weeks to make sure it is still below 140/90 Reduce the salt in your diet See your doctor in 1 to 2 months

## 2017-12-19 NOTE — ED Provider Notes (Signed)
[facility]    CSN: [id number] Arrival date & time: 12/19/17  1001     History   Chief Complaint Chief Complaint  Patient presents with  . Knee Pain    HPI Lisa Meyers is a 62 y.o. female.   HPI  Patient is here for pedal edema.  Swollen from her thighs all the way down to her feet.  She states that she eats a lot of salt.  She states that she is on blood pressure medication.  Recently amlodipine was added.  She is gone to her primary care doctor for this problem.  He has left her on her same medication.  Her blood pressure is well controlled.  Her diabetes is well controlled.  No shortness of breath.  No heart problems.  She gets blood work on a regular basis.  No kidney or liver disease.  Past Medical History:  Diagnosis Date  . Diabetes mellitus without complication (HCC)   . Hyperlipidemia 02/11/2014  . Hypertension   . Neck pain   . 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 Annual physical exam 11/26/2016  . PCP NOTES >>>>>>>>>>>>> 05/31/2016  . Hypertension 02/11/2014  . Diabetes mellitus without complication (HCC) 02/11/2014  . Hyperlipidemia 02/11/2014  . PPD positive 02/11/2014    Past Surgical History:  Procedure Laterality Date  . ABDOMINAL HYSTERECTOMY     abdominal, no oophorectomy  . BREAST CYST EXCISION    . CYST REMOVAL LEG  07-2013   inner L thigh, Dr Otho Perl     OB History   None      Home Medications    Prior to Admission medications   Medication Sig Start Date End Date Taking? Authorizing Provider  glucose blood test strip Use as instructed 04/28/17   Wanda Plump, MD  lisinopril-hydrochlorothiazide (PRINZIDE,ZESTORETIC) 20-12.5 MG tablet Take 1 tablet by mouth 2 (two) times daily. 12/19/17   Eustace Moore, MD  lovastatin (MEVACOR) 20 MG tablet Take 1 tablet (20 mg total) by mouth at bedtime. 12/19/17   Eustace Moore, MD  metFORMIN (GLUCOPHAGE) 1000 MG tablet Take 1  tablet (1,000 mg total) by mouth daily with breakfast. 12/19/17   Eustace Moore, MD    Family History Family History  Problem Relation Age of Onset  . Hypertension Mother   . Hypertension Father   . Heart disease Father   . Diabetes Maternal Aunt   . Colon cancer Neg Hx   . Breast cancer Neg Hx   . CAD Neg Hx     Social History Social History   Tobacco Use  . Smoking status: Never Smoker  . Smokeless tobacco: Never Used  Substance Use Topics  . Alcohol use: Yes    Alcohol/week: 0.0 standard drinks    Comment: wine, rare   . Drug use: No     Allergies   Patient has no known allergies.   Review of Systems Review of Systems  Constitutional: Negative for chills and fever.  HENT: Negative for ear pain and sore throat.   Eyes: Negative for pain and visual disturbance.  Respiratory: Negative for cough and shortness of breath.   Cardiovascular: Positive for leg swelling. Negative for chest pain and palpitations.  Gastrointestinal: Negative for abdominal pain and vomiting.  Genitourinary: Negative for dysuria and hematuria.  Musculoskeletal: Negative for arthralgias and back pain.  Skin: Negative for color change and rash.  Neurological:  Negative for seizures and syncope.  All other systems reviewed and are negative.    Physical Exam Triage Vital Signs ED Triage Vitals [12/19/17 1036]  Enc Vitals Group     BP 125/77     Pulse Rate 86     Resp 16     Temp 97.8 F (36.6 C)     Temp src      SpO2 99 %     Weight    No data found.  Updated Vital Signs BP 125/77   Pulse 86   Temp 97.8 F (36.6 C)   Resp 16   SpO2 99%    Physical Exam  Constitutional: She appears well-developed and well-nourished. No distress.  HENT:  Head: Normocephalic and atraumatic.  Right Ear: External ear normal.  Left Ear: External ear normal.  Mouth/Throat: Oropharynx is clear and moist.  Eyes: Pupils are equal, round, and reactive to light. Conjunctivae are normal.  Neck:  Normal range of motion. No JVD present.  Cardiovascular: Normal rate, regular rhythm and normal heart sounds.  Pulmonary/Chest: Effort normal and breath sounds normal. No respiratory distress. She has no rales.  Abdominal: Soft. She exhibits no distension.  Musculoskeletal: Normal range of motion. She exhibits edema.  Pitting edema bilaterally to the tibial tuberosity.  Both knees have good range of motion with no tenderness.  Neurological: She is alert.  Skin: Skin is warm and dry.  Psychiatric: She has a normal mood and affect. Her behavior is normal.     UC Treatments / Results  Labs (all labs ordered are listed, but only abnormal results are displayed) Labs Reviewed - No data to display  EKG None  Radiology No results found.  Procedures Procedures (including critical care time)  Medications Ordered in UC Medications - No data to display  Initial Impression / Assessment and Plan / UC Course  I have reviewed the triage vital signs and the nursing notes.  Pertinent labs & imaging results that were available during my care of the patient were reviewed by me and considered in my medical decision making (see chart for details).     My opinion she is having edema that is related to the amlodipine.  I think the first step is to switch her anti-hypertensive medication.  I believe she should be off the amlodipine and will double her lisinopril.  Her blood pressure is well controlled.  She is to continue to follow her blood pressure.  If she continues to have edema off of the amlodipine, then she will need further work-up.  She is advised strongly to try to reduce or stop her salt intake.  She is given written information about the hypertension diet. Final Clinical Impressions(s) / UC Diagnoses   Final diagnoses:  Peripheral edema     Discharge Instructions     Continue taking your metformin once a day.  This is for early diabetes. Continue taking your Mevacor once a day.   This is to lower your cholesterol. Take the blood pressure medicine lisinopril/hydrochlorothiazide twice a day. Check your blood pressure in 2 weeks to make sure it is still below 140/90 Reduce the salt in your diet See your doctor in 1 to 2 months   ED Prescriptions    Medication Sig Dispense Auth. Provider   lovastatin (MEVACOR) 20 MG tablet Take 1 tablet (20 mg total) by mouth at bedtime. 30 tablet Eustace Moore, MD   metFORMIN (GLUCOPHAGE) 1000 MG tablet Take 1 tablet (1,000 mg total) by  mouth daily with breakfast. 30 tablet Eustace Moore, MD   lisinopril-hydrochlorothiazide (PRINZIDE,ZESTORETIC) 20-12.5 MG tablet Take 1 tablet by mouth 2 (two) times daily. 60 tablet Eustace Moore, MD     Controlled Substance Prescriptions [place] Controlled Substance Registry consulted? Not Applicable   Eustace Moore, MD 12/19/17 2153

## 2017-12-28 ENCOUNTER — Encounter: Payer: Self-pay | Admitting: Medical

## 2017-12-28 ENCOUNTER — Ambulatory Visit (INDEPENDENT_AMBULATORY_CARE_PROVIDER_SITE_OTHER): Payer: Self-pay | Admitting: Medical

## 2017-12-28 ENCOUNTER — Telehealth (HOSPITAL_COMMUNITY): Payer: Self-pay | Admitting: Emergency Medicine

## 2017-12-28 VITALS — BP 132/82 | HR 83 | Temp 98.0°F | Resp 18 | Ht 65.0 in | Wt 209.4 lb

## 2017-12-28 DIAGNOSIS — M25562 Pain in left knee: Secondary | ICD-10-CM

## 2017-12-28 DIAGNOSIS — I10 Essential (primary) hypertension: Secondary | ICD-10-CM

## 2017-12-28 DIAGNOSIS — M25561 Pain in right knee: Secondary | ICD-10-CM

## 2017-12-28 MED ORDER — METOPROLOL SUCCINATE ER 25 MG PO TB24: 25.0000 mg | ORAL_TABLET | Freq: Every day | ORAL | 0 refills | Status: DC

## 2017-12-28 NOTE — Telephone Encounter (Signed)
[facility] pharmacy called to report pt's BP was 160/107 in the pharmacy.  Because we filled her last prescriptions, they called us.  I explained to the caller that if the pt is having urgent issues with her BP that she should go to the ED for evaluation, but if she was stable that she should call her PCP on file, Lisa Meyers to see if she can be seen today for further evaluation and assessment for her edema and HBP issues.  Pharmacy stated they would call Lisa Meyers and see if they could see her today.

## 2017-12-28 NOTE — Patient Instructions (Addendum)
Your blood pressure is well controlled presently but that seems to be due to the fact that you were on amlodipine today.  Your blood pressures was elevated recently at the pharmacy and I question whether your machine at home was giving accurate readings over the last 5 days.  You mentioned 5 year or so old machine.  I would recommend that you continue lisinopril/HCTZ but just use 1 tablet a day.  Also going to send new prescription of metoprolol ER 25 mg to take daily.  I want you to get a new blood pressure cuff and start checking your blood pressures daily.  Goal blood pressure would be for your pressure be consistently less than 140/90.  130/80 would be well controlled.  Your knee pain is mild recently.  On exam you had no crepitus or abnormal findings except minimal faint swelling.  We discussed getting potential x-rays today but you declined.  I think under the circumstances could wait until January when you get Medicare.  However if pain worsens let me know and I would place a x-ray order as well as potential inflammatory lab studies.  Follow-up in 10 to 14 days for blood pressure check or as needed.

## 2017-12-28 NOTE — Telephone Encounter (Signed)
Appt with Ramon DredgeEdward today at 3:20.

## 2017-12-28 NOTE — Telephone Encounter (Signed)
[facility] called to get pt appt today.  Was able to schedule with Ramon DredgeEdward at 3:20 pm.  Dr Drue NovelPaz doe not have any appts. Pt agreed to come in, per pharmacist. Would like if you can prescribe generic as pt is self pay

## 2017-12-28 NOTE — Progress Notes (Signed)
Subjective:    Patient ID: KARLIN BINION, female    DOB: 02-Jan-1956, 62 y.o.   MRN: [id number]  HPI  Pt for follow up from the ED.  Pt had high bp in ED with lisinopril 20 mg q day and also on hctz 12.5 mg q day. Her resent instruction on bottle said take twice daily but she could not tolerate twice daily due to excess urination.  Pt states amlodipine stopped at ED due to lower led pedal edema.  Pt states she thought her  bp was not controlled recently so she started back on amlodipine yesterday. She states the swelling has no reoccured but only took amlodipine one tab today.   Pt bp today was 132/82 when nurse checked. At pharmacy her bp was this am was144/100.  Over last week her bp has been 130/80 at home daily since she left the ED. But she states in past she thinks that her bp cuff at home does not work since it was always lower than pharmacy readings.  Also hx of some bilateral knee swelling. Mild pain bilateraly both side. No fall or trauma. Left side hurts more than rt side. No pedal edema today. Negative homans signs.   Pt explains to me overall that her bp regimen before going to ED was one lisinopril tab a day, hctz one tab a day in additon to amlodipine. ED advised stopping amlodipine and doubled ace/hctz but patient never increased the ace/hctz dosing.    Review of Systems  Constitutional: Negative for chills, fatigue and fever.  Respiratory: Negative for cough, chest tightness, shortness of breath and wheezing.   Cardiovascular: Negative for chest pain and palpitations.  Gastrointestinal: Negative for abdominal pain.  Musculoskeletal: Negative for back pain, neck pain and neck stiffness.  Skin: Negative for rash.  Hematological: Negative for adenopathy. Does not bruise/bleed easily.  Psychiatric/Behavioral: Negative for behavioral problems, confusion, hallucinations and suicidal ideas. The patient is not nervous/anxious.     Past Medical History:  Diagnosis Date    . Diabetes mellitus without complication (HCC)   . Hyperlipidemia 02/11/2014  . Hypertension   . Neck pain   . 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 Marital status: Single    Spouse name: Not on file  . Number of children: 1  . Years of education: Not on file  . Highest education level: Not on file  Occupational History  . Occupation: CNA -- Health and safety inspector  . Financial resource strain: Not on file  . Food insecurity:    Worry: Not on file    Inability: Not on file  . Transportation needs:    Medical: Not on file    Non-medical: Not on file  Tobacco Use  . Smoking status: Never Smoker  . Smokeless tobacco: Never Used  Substance and Sexual Activity  . Alcohol use: Yes    Alcohol/week: 0.0 standard drinks    Comment: wine, rare   . Drug use: No  . Sexual activity: Not on file  Lifestyle  . Physical activity:    Days per week: Not on file    Minutes per session: Not on file  . Stress: Not on file  Relationships  . Social connections:    Talks on phone: Not on file    Gets together: Not on file    Attends religious service: Not on file    Active member of club or organization:  Not on file    Attends meetings of clubs or organizations: Not on file    Relationship status: Not on file  . Intimate partner violence:    Fear of current or ex partner: Not on file    Emotionally abused: Not on file    Physically abused: Not on file    Forced sexual activity: Not on file  Other Topics Concern  . Not on file  Social History Narrative   Born in [place]   Live in [place] years, then [place], moved to [place]   Lives w/ sister     Past Surgical History:  Procedure Laterality Date  . ABDOMINAL HYSTERECTOMY     abdominal, no oophorectomy  . BREAST CYST EXCISION    . CYST REMOVAL LEG  07-2013   inner L thigh, [place], [place]     Family History  Problem Relation Age of Onset  . Hypertension Mother   .  Hypertension Father   . Heart disease Father   . Diabetes Maternal Aunt   . Colon cancer Neg Hx   . Breast cancer Neg Hx   . CAD Neg Hx     No Known Allergies  Current Outpatient Medications on File Prior to Visit  Medication Sig Dispense Refill  . glucose blood test strip Use as instructed 100 each 12  . lisinopril-hydrochlorothiazide (PRINZIDE,ZESTORETIC) 20-12.5 MG tablet Take 1 tablet by mouth 2 (two) times daily. 60 tablet 1  . lovastatin (MEVACOR) 20 MG tablet Take 1 tablet (20 mg total) by mouth at bedtime. 30 tablet 1  . metFORMIN (GLUCOPHAGE) 1000 MG tablet Take 1 tablet (1,000 mg total) by mouth daily with breakfast. 30 tablet 1   No current facility-administered medications on file prior to visit.     BP 132/82   Pulse 83   Temp 98 F (36.7 C) (Oral)   Resp 18   Ht 5\' 5"  (1.651 m)   Wt 209 lb 6.4 oz (95 kg)   SpO2 96%   BMI 34.85 kg/m       Objective:   Physical Exam  General Mental Status- Alert. General Appearance- Not in acute distress.   Skin General: Color- Normal Color. Moisture- Normal Moisture.  Neck Carotid Arteries- Normal color. Moisture- Normal Moisture. No carotid bruits. No JVD.  Chest and Lung Exam Auscultation: Breath Sounds:-Normal.  Cardiovascular Auscultation:Rythm- Regular. Murmurs & Other Heart Sounds:Auscultation of the heart reveals- No Murmurs.  Abdomen Inspection:-Inspeection Normal. Palpation/Percussion:Note:No mass. Palpation and Percussion of the abdomen reveal- Non Tender, Non Distended + BS, no rebound or guarding.   Neurologic Cranial Nerve exam:- CN III-XII intact(No nystagmus), symmetric smile. Strength:- 5/5 equal and symmetric strength both upper and lower extremities.  Knees- faint swelling anterior aspect of knees. No pain on palpation. No warmth. No induration. Good rom with no crepitus.  Lower ext- no pedal edema. Negative homans signs.      Assessment & Plan:  Your blood pressure is well  controlled presently but that seems to be due to the fact that you were on amlodipine today.  Your blood pressures was elevated recently at the pharmacy and I question whether your machine at home was giving accurate readings over the last 5 days.  I would recommend that you continue lisinopril/HCTZ but just use 1 tablet a day.  Also going to send new prescription of metoprolol ER 25 mg to take daily.  I want you to get a new blood pressure cuff and start checking your  blood pressures daily.  Goal blood pressure would be for your pressure be consistently less than 140/90.  130/80 would be well controlled.  Your knee pain is mild recently.  On exam you had no crepitus or abnormal findings except minimal faint swelling.  We discussed getting potential x-rays today but you declined.  I think under the circumstances could wait until January when you get Medicare.  However if pain worsens let me know and I would place a x-ray order as well as potential inflammatory lab studies.  Follow-up in 10 to 14 days for blood pressure check or as needed.   Esperanza Richters, PA-C

## 2018-02-07 ENCOUNTER — Other Ambulatory Visit: Payer: Self-pay | Admitting: Internal Medicine

## 2018-02-07 MED ORDER — LISINOPRIL-HYDROCHLOROTHIAZIDE 20-12.5 MG PO TABS: 1.0000 | ORAL_TABLET | Freq: Two times a day (BID) | ORAL | 5 refills | Status: DC

## 2018-02-07 MED ORDER — LOVASTATIN 20 MG PO TABS: 20.0000 mg | ORAL_TABLET | Freq: Every day | ORAL | 5 refills | Status: DC

## 2018-02-07 MED ORDER — METFORMIN HCL 1000 MG PO TABS: 1000.0000 mg | ORAL_TABLET | Freq: Every day | ORAL | 5 refills | Status: DC

## 2018-02-07 NOTE — Telephone Encounter (Signed)
Copied from CRM [id number]. Topic: Quick Communication - Rx Refill/Question >> Feb 07, 2018 11:20 AM Burchel, Abbi R wrote: Medication: lisinopril-hydrochlorothiazide (PRINZIDE,ZESTORETIC) 20-12.5 MG tablet   lovastatin (MEVACOR) 20 MG tablet   metFORMIN (GLUCOPHAGE) 1000 MG tablet   Pt requesting a 90 day supply.    Preferred Pharmacy: [id number] - [place], [place] - [address] AT [facility] [address] [place]  [id number] Phone: [phone] Fax: [phone]   Please not pt is out of these meds.  Pt was advised that RX refills may take up to 3 business days. We ask that you follow-up with your pharmacy.

## 2018-02-07 NOTE — Telephone Encounter (Signed)
Requested medication (s) are due for refill today: yes  Requested medication (s) are on the active medication list: yes  Last refill:  12/18/17  Future visit scheduled: yes  Notes to clinic: original prescriptions written by Dr Meda Coffee at urgent care; pt seen in office by Mackie Pai 12/28/17; Dr Larose Kells is PCP     Requested Prescriptions  Pending Prescriptions Disp Refills   lisinopril-hydrochlorothiazide (PRINZIDE,ZESTORETIC) 20-12.5 MG tablet 60 tablet 1    Sig: Take 1 tablet by mouth 2 (two) times daily.     Cardiovascular:  ACEI + Diuretic Combos Failed - 02/07/2018 11:28 AM      Failed - Na in normal range and within 180 days    Sodium  Date Value Ref Range Status  11/26/2016 139 135 - 145 mEq/L Final         Failed - K in normal range and within 180 days    Potassium  Date Value Ref Range Status  11/26/2016 3.5 3.5 - 5.1 mEq/L Final         Failed - Cr in normal range and within 180 days    Creat  Date Value Ref Range Status  10/28/2014 0.70 0.50 - 1.10 mg/dL Final   Creatinine, Ser  Date Value Ref Range Status  11/26/2016 0.78 0.40 - 1.20 mg/dL Final         Failed - Ca in normal range and within 180 days    Calcium  Date Value Ref Range Status  11/26/2016 10.0 8.4 - 10.5 mg/dL Final         Passed - Patient is not pregnant      Passed - Last BP in normal range    BP Readings from Last 1 Encounters:  12/28/17 132/82         Passed - Valid encounter within last 6 months    Recent Outpatient Visits          1 month ago Essential hypertension   [facility], [place], PA-C   6 months ago Edema of both legs   [facility] at [place], MD   10 months ago Right foot pain   [facility] at [place], [place]   1 year ago Essential hypertension   [facility] at [place], MD   1 year ago  Essential hypertension   [facility] at [place], MD      Future Appointments            In 2 months Colon Branch, MD [place] [facility], [facility]          lovastatin (MEVACOR) 20 MG tablet 30 tablet 1    Sig: Take 1 tablet (20 mg total) by mouth at bedtime.     Cardiovascular:  Antilipid - Statins Failed - 02/07/2018 11:28 AM      Failed - Total Cholesterol in normal range and within 360 days    Cholesterol  Date Value Ref Range Status  05/31/2016 180 0 - 200 mg/dL Final    Comment:    ATP III Classification       Desirable:  < 200 mg/dL               Borderline High:  200 - 239 mg/dL  High:  > = 240 mg/dL         Failed - LDL in normal range and within 360 days    LDL Cholesterol  Date Value Ref Range Status  05/31/2016 101 (H) 0 - 99 mg/dL Final         Failed - HDL in normal range and within 360 days    HDL  Date Value Ref Range Status  05/31/2016 68.60 >39.00 mg/dL Final         Failed - Triglycerides in normal range and within 360 days    Triglycerides  Date Value Ref Range Status  05/31/2016 53.0 0.0 - 149.0 mg/dL Final    Comment:    Normal:  <150 mg/dLBorderline High:  150 - 199 mg/dL         Passed - Patient is not pregnant      Passed - Valid encounter within last 12 months    Recent Outpatient Visits          1 month ago Essential hypertension   [facility] at [place], PA-C   6 months ago Edema of both legs   [facility] at [place], MD   10 months ago Right foot pain   Archivist at [place], MD   1 year ago Essential hypertension   [facility]ivist at [place], MD   1 year ago Essential hypertension   Archivist at [place], MD      Future Appointments             In 2 months Colon Branch, MD [place] at [facility], [facility]          metFORMIN (GLUCOPHAGE) 1000 MG tablet 30 tablet 1    Sig: Take 1 tablet (1,000 mg total) by mouth daily with breakfast.     Endocrinology:  Diabetes - Biguanides Failed - 02/07/2018 11:28 AM      Failed - Cr in normal range and within 360 days    Creat  Date Value Ref Range Status  10/28/2014 0.70 0.50 - 1.10 mg/dL Final   Creatinine, Ser  Date Value Ref Range Status  11/26/2016 0.78 0.40 - 1.20 mg/dL Final         Failed - HBA1C is between 0 and 7.9 and within 180 days    Hgb A1c MFr Bld  Date Value Ref Range Status  11/26/2016 6.7 (H) 4.6 - 6.5 % Final    Comment:    Glycemic Control Guidelines for People with Diabetes:Non Diabetic:  <6%Goal of Therapy: <7%Additional Action Suggested:  >8%          Failed - eGFR in normal range and within 360 days    GFR, Est African American  Date Value Ref Range Status  10/28/2014 >89 mL/min Final   GFR, Est Non African American  Date Value Ref Range Status  10/28/2014 >89 mL/min Final    Comment:      The estimated GFR is a calculation valid for adults (>=56 years old) that uses the CKD-EPI algorithm to adjust for age and sex. It is   not to be used for children, pregnant women, hospitalized patients,    patients on dialysis, or with rapidly changing kidney function. According to the NKDEP, eGFR >89 is normal, 60-89 shows mild  impairment, 30-59 shows moderate impairment, 15-29 shows severe impairment and <15 is ESRD.      GFR  Date Value Ref Range Status  11/26/2016 95.76 >60.00 mL/min Final         Passed - Valid encounter within last 6 months    Recent Outpatient Visits          1 month ago Essential hypertension   [facility] at [place], [place]   6 months ago Edema of both legs   Archivist at [place], MD   10 months ago Right  foot pain   [facility]ivist at [place], MD   1 year ago Essential hypertension   Archivist at [place], MD   1 year ago Essential hypertension   Archivist at [place], MD      Future Appointments            In 2 months Larose Kells, Alda Berthold, MD [employer] at [facility], [place]

## 2018-02-08 ENCOUNTER — Ambulatory Visit (INDEPENDENT_AMBULATORY_CARE_PROVIDER_SITE_OTHER): Payer: Self-pay

## 2018-02-08 ENCOUNTER — Encounter (HOSPITAL_COMMUNITY): Payer: Self-pay | Admitting: Emergency Medicine

## 2018-02-08 ENCOUNTER — Ambulatory Visit (HOSPITAL_COMMUNITY)
Admission: EM | Admit: 2018-02-08 | Discharge: 2018-02-08 | Disposition: A | Discharge status: Home / Self Care | Payer: Self-pay | Attending: Family Medicine | Admitting: Family Medicine

## 2018-02-08 DIAGNOSIS — G8929 Other chronic pain: Secondary | ICD-10-CM

## 2018-02-08 DIAGNOSIS — M25561 Pain in right knee: Secondary | ICD-10-CM

## 2018-02-08 DIAGNOSIS — M25562 Pain in left knee: Secondary | ICD-10-CM

## 2018-02-08 MED ORDER — MELOXICAM 7.5 MG PO TABS: 7.5000 mg | ORAL_TABLET | Freq: Every day | ORAL | 1 refills | Status: DC

## 2018-02-08 NOTE — ED Triage Notes (Signed)
Patient c/o knee swelling x 2 months, states she feels like "my knees are going to give out on me".  States she has been seen in the past for the same and "I want someone to tell me what's going on".

## 2018-02-08 NOTE — ED Provider Notes (Signed)
[facility]    CSN: [id number] Arrival date & time: 02/08/18  1152  Patient is a 58   History   Chief Complaint Chief Complaint  Patient presents with  . Knee Pain    HPI Lisa Meyers is a 62 y.o. female.   Patient is a 62 year old female that presents for bilateral knee pain and swelling.  This is a chronic issue. No injury. Reports that her knees keep giving way.  She was seen here on 12/19/2017 and was thought to have swelling due to the amlodipine she was taking.  She was then switched from amlodipine to lisinopril.  She reports that the swelling has continued since then.  She is having mild tenderness to both knees.  This is been an ongoing issue for many months.  She has not taken anything for her symptoms. She denies any fever, chills, body aches, numbness, tingling or weakness.   ROS per HPI      Past Medical History:  Diagnosis Date  . Diabetes mellitus without complication (HCC)   . Hyperlipidemia 02/11/2014  . Hypertension   . Neck pain   . 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 Annual physical exam 11/26/2016  . PCP NOTES >>>>>>>>>>>>> 05/31/2016  . Hypertension 02/11/2014  . Diabetes mellitus without complication (HCC) 02/11/2014  . Hyperlipidemia 02/11/2014  . PPD positive 02/11/2014    Past Surgical History:  Procedure Laterality Date  . ABDOMINAL HYSTERECTOMY     abdominal, no oophorectomy  . BREAST CYST EXCISION    . CYST REMOVAL LEG  07-2013   inner L thigh, Dr Otho Perl     OB History   None      Home Medications    Prior to Admission medications   Medication Sig Start Date End Date Taking? Authorizing Provider  amLODipine (NORVASC) 10 MG tablet Take 10 mg by mouth daily.   Yes [provider]  glucose blood test strip Use as instructed 04/28/17   Wanda Plump, MD  lisinopril-hydrochlorothiazide (PRINZIDE,ZESTORETIC) 20-12.5 MG tablet Take 1 tablet by mouth 2  (two) times daily. 02/07/18   Wanda Plump, MD  lovastatin (MEVACOR) 20 MG tablet Take 1 tablet (20 mg total) by mouth at bedtime. 02/07/18   Wanda Plump, MD  meloxicam (MOBIC) 7.5 MG tablet Take 1 tablet (7.5 mg total) by mouth daily. 02/08/18   Dahlia Byes A, NP  metFORMIN (GLUCOPHAGE) 1000 MG tablet Take 1 tablet (1,000 mg total) by mouth daily with breakfast. 02/07/18   Wanda Plump, MD  metoprolol succinate (TOPROL-XL) 25 MG 24 hr tablet Take 1 tablet (25 mg total) by mouth daily. 12/28/17   Saguier, Ramon Dredge, PA-C    Family History Family History  Problem Relation Age of Onset  . Hypertension Mother   . Hypertension Father   . Heart disease Father   . Diabetes Maternal Aunt   . Colon cancer Neg Hx   . Breast cancer Neg Hx   . CAD Neg Hx     Social History Social History   Tobacco Use  . Smoking status: Never Smoker  . Smokeless tobacco: Never Used  Substance Use Topics  . Alcohol use: Yes    Alcohol/week: 0.0 standard drinks    Comment: wine, rare   . Drug use: No     Allergies   Patient has no known allergies.   Review of Systems Review of Systems  Physical Exam Triage Vital Signs ED Triage Vitals  Enc Vitals Group     BP 02/08/18 1230 130/84     Pulse Rate 02/08/18 1230 80     Resp 02/08/18 1230 18     Temp 02/08/18 1230 97.9 F (36.6 C)     Temp Source 02/08/18 1230 Oral     SpO2 02/08/18 1230 97 %     Weight --      Height --      Head Circumference --      Peak Flow --      Pain Score 02/08/18 1240 0     Pain Loc --      Pain Edu? --      Excl. in GC? --    No data found.  Updated Vital Signs BP 130/84 (BP Location: Right Arm)   Pulse 80   Temp 97.9 F (36.6 C) (Oral)   Resp 18   SpO2 97%   Visual Acuity Right Eye Distance:   Left Eye Distance:   Bilateral Distance:    Right Eye Near:   Left Eye Near:    Bilateral Near:     Physical Exam  Constitutional: She appears well-developed and well-nourished.  Appears upset and  irritated   HENT:  Head: Normocephalic and atraumatic.  Eyes: Conjunctivae are normal.  Neck: Normal range of motion.  Pulmonary/Chest: Effort normal.  Musculoskeletal: Normal range of motion. She exhibits no deformity.  Tenderness to bilateral knees, non specific,  with swelling. No erythema or deformity.   Neurological: She is alert.  Skin: Skin is warm.  Psychiatric: Her affect is angry and blunt. She is agitated and aggressive.  Nursing note and vitals reviewed.    UC Treatments / Results  Labs (all labs ordered are listed, but only abnormal results are displayed) Labs Reviewed - No data to display  EKG None  Radiology Dg Knee Complete 4 Views Left  Result Date: 02/08/2018 CLINICAL DATA:  Chronic bilateral knee pain and swelling. EXAM: LEFT KNEE - COMPLETE 4+ VIEW COMPARISON:  None. FINDINGS: There is no evidence of fracture or dislocation. Small wall osteophytes are noted. The joint spaces are preserved. No additional degenerative change is seen; the patellofemoral joint is grossly unremarkable in appearance. No significant joint effusion is seen. The visualized soft tissues are normal in appearance. IMPRESSION: No evidence of fracture or dislocation. Electronically Signed   By: Roanna Raider M.D.   On: 02/08/2018 13:47   Dg Knee Complete 4 Views Right  Result Date: 02/08/2018 CLINICAL DATA:  Chronic bilateral knee pain and swelling. EXAM: RIGHT KNEE - COMPLETE 4+ VIEW COMPARISON:  None. FINDINGS: There is no evidence of fracture or dislocation. The joint spaces are preserved. Small wall and tibial spine osteophytes are noted. A fabella is seen. No significant joint effusion is seen. The visualized soft tissues are normal in appearance. IMPRESSION: No evidence of fracture or dislocation. Electronically Signed   By: Roanna Raider M.D.   On: 02/08/2018 13:48    Procedures Procedures (including critical care time)  Medications Ordered in UC Medications - No data to  display  Initial Impression / Assessment and Plan / UC Course  I have reviewed the triage vital signs and the nursing notes.  Pertinent labs & imaging results that were available during my care of the patient were reviewed by me and considered in my medical decision making (see chart for details).     Pt very angry and agitated upon me taking HPI,  reporting that she has had this ongoing knee problem with swelling and nobody is helping her. I explained to her that I have never me her but would try to help her. She then proceeded to throw her hands around and demand that I withdraw the fluid from her knees. I then explained that I would do an x ray of her knees but most likely she would need ortho follow up. She agreed.  The x ray reveled no abnormalities. Placed pt in Ace wraps and meloxicam for pain and inflammation.  She will need to follow up with ortho Final Clinical Impressions(s) / UC Diagnoses   Final diagnoses:  Chronic pain of both knees     Discharge Instructions     Your knee x-rays did not show any abnormalities or fluid in the knee joint You could have some ligamentous problem that x rays do not show. You may need an MRI that I cant order. Your PCP can order this and orthopedics.  We will place Ace wraps on both knees to help with the swelling.  Make Sure you are elevating the legs as much as possible I am sending in meloxicam for pain and inflammation Call Dr. Diamantina [place] office with [facility] orthopedics for follow-up within the next week    ED Prescriptions    Medication Sig Dispense Auth. Provider   meloxicam (MOBIC) 7.5 MG tablet Take 1 tablet (7.5 mg total) by mouth daily. 30 tablet Dahlia Byes A, NP     Controlled Substance Prescriptions [place] Controlled Substance Registry consulted? Not Applicable   Janace Aris, NP 02/08/18 1515

## 2018-02-08 NOTE — Discharge Instructions (Addendum)
Your knee x-rays did not show any abnormalities or fluid in the knee joint You could have some ligamentous problem that x rays do not show. You may need an MRI that I cant order. Your PCP can order this and orthopedics.  We will place Ace wraps on both knees to help with the swelling.  Make Sure you are elevating the legs as much as possible I am sending in meloxicam for pain and inflammation Call Dr. Diamantina [place] office with [facility] orthopedics for follow-up within the next week

## 2018-03-16 ENCOUNTER — Encounter: Payer: Self-pay | Admitting: Internal Medicine

## 2018-04-13 ENCOUNTER — Encounter: Payer: Self-pay | Admitting: Internal Medicine

## 2018-04-13 ENCOUNTER — Ambulatory Visit (INDEPENDENT_AMBULATORY_CARE_PROVIDER_SITE_OTHER): Payer: Medicare Other | Admitting: Internal Medicine

## 2018-04-13 VITALS — BP 128/84 | HR 68 | Temp 97.9°F | Resp 16 | Ht 65.0 in | Wt 207.1 lb

## 2018-04-13 DIAGNOSIS — G5602 Carpal tunnel syndrome, left upper limb: Secondary | ICD-10-CM

## 2018-04-13 DIAGNOSIS — I10 Essential (primary) hypertension: Secondary | ICD-10-CM

## 2018-04-13 DIAGNOSIS — I739 Peripheral vascular disease, unspecified: Secondary | ICD-10-CM

## 2018-04-13 DIAGNOSIS — Z01419 Encounter for gynecological examination (general) (routine) without abnormal findings: Secondary | ICD-10-CM

## 2018-04-13 DIAGNOSIS — Z1231 Encounter for screening mammogram for malignant neoplasm of breast: Secondary | ICD-10-CM

## 2018-04-13 DIAGNOSIS — E785 Hyperlipidemia, unspecified: Secondary | ICD-10-CM

## 2018-04-13 DIAGNOSIS — Z1211 Encounter for screening for malignant neoplasm of colon: Secondary | ICD-10-CM | POA: Diagnosis not present

## 2018-04-13 DIAGNOSIS — I779 Disorder of arteries and arterioles, unspecified: Secondary | ICD-10-CM

## 2018-04-13 DIAGNOSIS — E119 Type 2 diabetes mellitus without complications: Secondary | ICD-10-CM | POA: Diagnosis not present

## 2018-04-13 LAB — COMPREHENSIVE METABOLIC PANEL
ALBUMIN: 4.1 g/dL (ref 3.5–5.2)
ALT: 11 U/L (ref 0–35)
AST: 13 U/L (ref 0–37)
Alkaline Phosphatase: 56 U/L (ref 39–117)
BILIRUBIN TOTAL: 0.4 mg/dL (ref 0.2–1.2)
BUN: 37 mg/dL — ABNORMAL HIGH (ref 6–23)
CHLORIDE: 104 meq/L (ref 96–112)
CO2: 30 mEq/L (ref 19–32)
Calcium: 9.8 mg/dL (ref 8.4–10.5)
Creatinine, Ser: 0.92 mg/dL (ref 0.40–1.20)
GFR: 78.8 mL/min (ref >60.00)
Glucose, Bld: 123 mg/dL — ABNORMAL HIGH (ref 70–99)
Potassium: 4 mEq/L (ref 3.5–5.1)
Sodium: 140 mEq/L (ref 135–145)
Total Protein: 7.1 g/dL (ref 6.0–8.3)

## 2018-04-13 LAB — CBC WITH DIFFERENTIAL/PLATELET
BASOS PCT: 1.1 % (ref 0.0–3.0)
Basophils Absolute: 0 10*3/uL (ref 0.0–0.1)
Eosinophils Absolute: 0.1 10*3/uL (ref 0.0–0.7)
Eosinophils Relative: 2.8 % (ref 0.0–5.0)
HEMATOCRIT: 36.8 % (ref 36.0–46.0)
Hemoglobin: 12.2 g/dL (ref 12.0–15.0)
LYMPHS ABS: 1.9 10*3/uL (ref 0.7–4.0)
Lymphocytes Relative: 56.9 % — ABNORMAL HIGH (ref 12.0–46.0)
MCHC: 33.2 g/dL (ref 30.0–36.0)
MCV: 90.3 fl (ref 78.0–100.0)
MONOS PCT: 10.9 % (ref 3.0–12.0)
Monocytes Absolute: 0.4 10*3/uL (ref 0.1–1.0)
NEUTROS ABS: 0.9 10*3/uL — AB (ref 1.4–7.7)
NEUTROS PCT: 28.3 % — AB (ref 43.0–77.0)
PLATELETS: 249 10*3/uL (ref 150.0–400.0)
RBC: 4.07 Mil/uL (ref 3.87–5.11)
RDW: 13 % (ref 11.5–15.5)
WBC: 3.3 10*3/uL — ABNORMAL LOW (ref 4.0–10.5)

## 2018-04-13 LAB — LIPID PANEL
CHOL/HDL RATIO: 3
CHOLESTEROL: 178 mg/dL (ref 0–200)
HDL: 65 mg/dL (ref >39.00)
LDL Cholesterol: 104 mg/dL — ABNORMAL HIGH (ref 0–99)
NonHDL: 113.1
TRIGLYCERIDES: 47 mg/dL (ref 0.0–149.0)
VLDL: 9.4 mg/dL (ref 0.0–40.0)

## 2018-04-13 LAB — HEMOGLOBIN A1C: Hgb A1c MFr Bld: 7.1 % — ABNORMAL HIGH (ref 4.6–6.5)

## 2018-04-13 MED ORDER — ZOSTER VAC RECOMB ADJUVANTED 50 MCG/0.5ML IM SUSR: 0.5000 mL | Freq: Once | INTRAMUSCULAR | 1 refills | Status: AC

## 2018-04-13 NOTE — Assessment & Plan Note (Addendum)
Preventive care reviewed today: Td 11-2016; had a flu shot ; shingres RX printed  Female care  Last MMG 2017, referral sent No recent PAP: refer to gyn [facility]: 3 modalities discussed, elected I FOB

## 2018-04-13 NOTE — Progress Notes (Signed)
Pre visit review using our clinic review tool, if applicable. No additional management support is needed unless otherwise documented below in the visit note. 

## 2018-04-13 NOTE — Progress Notes (Signed)
Subjective:    Patient ID: Lisa Meyers, female    DOB: [birth year], 63 y.o.   MRN: [id number]  DOS:  04/13/2018 Type of visit - description: rov HTN: Recently, amlodipine was discontinued due to lower extremity edema, swelling better DM: Good compliance with metformin without apparent side effects High cholesterol, good compliance to medication.  Checking labs Long history of left carpal tunnel syndrome, current symptoms is second finger numbness.  Review of Systems Denies chest pain no difficulty breathing No nausea, vomiting, diarrhea.  No blood in the stools. No headache or dizziness. Had "the flu" 2 weeks ago, much improved now.  Past Medical History:  Diagnosis Date  . Diabetes mellitus without complication (HCC)   . Hyperlipidemia 02/11/2014  . Hypertension   . Neck pain   . 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 ABDOMINAL HYSTERECTOMY     abdominal, no oophorectomy  . BREAST CYST EXCISION    . CYST REMOVAL LEG  07-2013   inner L thigh, Dr Vickki Muff, [facility]     Social History   Socioeconomic History  . Marital status: Single    Spouse name: Not on file  . Number of children: 1  . Years of education: Not on file  . Highest education level: Not on file  Occupational History  . Occupation: CNA -- Health and safety inspector  . Financial resource strain: Not on file  . Food insecurity:    Worry: Not on file    Inability: Not on file  . Transportation needs:    Medical: Not on file    Non-medical: Not on file  Tobacco Use  . Smoking status: Never Smoker  . Smokeless tobacco: Never Used  Substance and Sexual Activity  . Alcohol use: Yes    Alcohol/week: 0.0 standard drinks    Comment: wine, rare   . Drug use: No  . Sexual activity: Not on file  Lifestyle  . Physical activity:    Days per week: Not on file    Minutes per session: Not on file  . Stress: Not on file  Relationships  . Social connections:    Talks  on phone: Not on file    Gets together: Not on file    Attends religious service: Not on file    Active member of club or organization: Not on file    Attends meetings of clubs or organizations: Not on file    Relationship status: Not on file  . Intimate partner violence:    Fear of current or ex partner: Not on file    Emotionally abused: Not on file    Physically abused: Not on file    Forced sexual activity: Not on file  Other Topics Concern  . Not on file  Social History Narrative   Born in [place]   Live in [place] x years, then [place], moved to [place] 2015   Lives by herself      Allergies as of 04/13/2018   No Known Allergies     Medication List       Accurate as of April 13, 2018 11:59 PM. Always use your most recent med list.        glucose blood test strip Use as instructed   lisinopril-hydrochlorothiazide 20-12.5 MG tablet Commonly known as:  PRINZIDE,ZESTORETIC Take 1 tablet by mouth 2 (two) times daily.   lovastatin 20 MG tablet Commonly known as:  MEVACOR  Take 1 tablet (20 mg total) by mouth at bedtime.   metFORMIN 1000 MG tablet Commonly known as:  GLUCOPHAGE Take 1 tablet (1,000 mg total) by mouth daily with breakfast.   metoprolol succinate 25 MG 24 hr tablet Commonly known as:  TOPROL-XL Take 1 tablet (25 mg total) by mouth daily.   Zoster Vaccine Adjuvanted injection Commonly known as:  SHINGRIX Inject 0.5 mLs into the muscle once for 1 dose.           Objective:   Physical Exam BP 128/84 (BP Location: Left Arm, Patient Position: Sitting, Cuff Size: Normal)   Pulse 68   Temp 97.9 F (36.6 C) (Oral)   Resp 16   Ht 5\' 5"  (1.651 m)   Wt 207 lb 2 oz (94 kg)   SpO2 90%   BMI 34.47 kg/m  General: Well developed, NAD, BMI noted Neck: No  thyromegaly  HEENT:  Normocephalic . Face symmetric, atraumatic.  TMs normal, throat symmetric not red Lungs:  CTA B Normal respiratory effort, no intercostal retractions, no accessory  muscle use. Heart: RRR,  no murmur.  No pretibial edema bilaterally  Abdomen:  Not distended, soft, non-tender. No rebound or rigidity.   Skin: Exposed areas without rash. Not pale. Not jaundice MSK: Left thenar hypotrophy Neurologic:  alert & oriented X3.  Speech normal, gait appropriate for age and unassisted Strength symmetric and appropriate for age.  Psych: Cognition and judgment appear intact.  Cooperative with normal attention span and concentration.  Behavior appropriate. No anxious or depressed appearing.     Assessment      Assessment DM HTN. Amlodipine d/c 2019 d/t swelling  Hyperlipidemia + PPD 1990s + PPD 09/03/2015, 16 mm, x-ray negative, completed 3 months of INH 12/11/2015 at [facility] department  PLAN: DM: Currently on metformin, check A1c. HTN: Amlodipine recently discontinued, on lisinopril HCTZ, metoprolol; no ambulatory BPs ; get a CMP and CBC High cholesterol: On lovastatin: Check a FLP. Lower extremity edema: Better after amlodipine discontinued. Carotid artery disease: Had a [employer] screening, screening positive for carotid artery disease, will do a formal ultrasound. Left carpal tunnel syndrome: The patient has a long history of numbness at the second finger, on exam she has thenar hypotrophy, previously has seen a [employer] doctor who ran some tests but she was told they could not perform any treatment because it was not work-related.  Refer to Ortho hand. Preventive care reviewed RTC 4 months

## 2018-04-13 NOTE — Patient Instructions (Signed)
GO TO THE LAB : Get the blood work     GO TO THE FRONT DESK Schedule your next appointment for a checkup here in 4 months  Consider Shingrix  Carotid ultrasound  Refer to gynecology  Schedule your mammograms at the first floor  Refer to orthopedic doctor for carpal tunnel syndrome

## 2018-04-14 ENCOUNTER — Ambulatory Visit (INDEPENDENT_AMBULATORY_CARE_PROVIDER_SITE_OTHER): Payer: Medicare Other | Admitting: Orthopedic Surgery

## 2018-04-14 ENCOUNTER — Other Ambulatory Visit: Payer: Self-pay | Admitting: Internal Medicine

## 2018-04-14 DIAGNOSIS — M25561 Pain in right knee: Secondary | ICD-10-CM

## 2018-04-14 DIAGNOSIS — G8929 Other chronic pain: Secondary | ICD-10-CM

## 2018-04-14 DIAGNOSIS — M25562 Pain in left knee: Secondary | ICD-10-CM

## 2018-04-14 MED ORDER — LISINOPRIL-HYDROCHLOROTHIAZIDE 20-12.5 MG PO TABS: 1.0000 | ORAL_TABLET | Freq: Two times a day (BID) | ORAL | 1 refills | Status: DC

## 2018-04-14 MED ORDER — METOPROLOL SUCCINATE ER 25 MG PO TB24: 25.0000 mg | ORAL_TABLET | Freq: Every day | ORAL | 1 refills | Status: DC

## 2018-04-14 MED ORDER — METFORMIN HCL 1000 MG PO TABS: 1000.0000 mg | ORAL_TABLET | Freq: Every day | ORAL | 1 refills | Status: DC

## 2018-04-14 MED ORDER — LOVASTATIN 20 MG PO TABS: 20.0000 mg | ORAL_TABLET | Freq: Every day | ORAL | 1 refills | Status: DC

## 2018-04-14 NOTE — Telephone Encounter (Signed)
Requested medication (s) are due for refill today: Yes  Requested medication (s) are on the active medication list: Yes  Last refill:  04/2016  Future visit scheduled: Yes  Notes to clinic:  Patient asking for new prescriptions to be sent to her new pharmacy for:  Accu-Chek Guide Blood Glucose Meter, accu chek guide lancets, Accu-Chek Guide test strips.    Requested Prescriptions  Pending Prescriptions Disp Refills   glucose blood test strip 100 each 12    Sig: Use as instructed     Endocrinology: Diabetes - Testing Supplies Passed - 04/14/2018  5:21 PM      Passed - Valid encounter within last 12 months    Recent Outpatient Visits          Yesterday Essential hypertension   [facility] [place], [place]   3 months ago Essential hypertension   [facility] at [place], [place]   8 months ago Edema of both legs   [facility] at [place], [place]   1 year ago Right foot pain   [facility] at [place], [place]   1 year ago Essential hypertension   [facility] at [place], [place]      Future Appointments            In 4 months Colon Branch, MD [place] at [facility], [place]         Signed Prescriptions Disp Refills   metFORMIN (GLUCOPHAGE) 1000 MG tablet 90 tablet 1    Sig: Take 1 tablet (1,000 mg total) by mouth daily with breakfast.     Endocrinology:  Diabetes - Biguanides Passed - 04/14/2018  5:21 PM      Passed - Cr in normal range and within 360 days    Creat  Date Value Ref Range Status  10/28/2014 0.70 0.50 - 1.10 mg/dL Final   Creatinine, Ser  Date Value Ref Range Status  04/13/2018 0.92 0.40 - 1.20 mg/dL Final         Passed - HBA1C is between 0 and 7.9 and within 180 days    Hgb A1c MFr Bld  Date Value Ref Range Status  04/13/2018 7.1  (H) 4.6 - 6.5 % Final    Comment:    Glycemic Control Guidelines for People with Diabetes:Non Diabetic:  <6%Goal of Therapy: <7%Additional Action Suggested:  >8%          Passed - eGFR in normal range and within 360 days    GFR, Est African American  Date Value Ref Range Status  10/28/2014 >89 mL/min Final   GFR, Est Non African American  Date Value Ref Range Status  10/28/2014 >89 mL/min Final    Comment:      The estimated GFR is a calculation valid for adults (>=46 years old) that uses the CKD-EPI algorithm to adjust for age and sex. It is   not to be used for children, pregnant women, hospitalized patients,    patients on dialysis, or with rapidly changing kidney function. According to the NKDEP, eGFR >89 is normal, 60-89 shows mild impairment, 30-59 shows moderate impairment, 15-29 shows severe impairment and <15 is ESRD.      GFR  Date Value Ref Range Status  04/13/2018 78.80 >60.00 mL/min Final  Passed - Valid encounter within last 6 months    Recent Outpatient Visits          Yesterday Essential hypertension   [facility] at [place], [place]   3 months ago Essential hypertension   [facility] at [place], [place]   8 months ago Edema of both legs   [facility] at [place], [place]   1 year ago Right foot pain   [facility] at [place], [place]   1 year ago Essential hypertension   [facility] at [place], [place]      Future Appointments            In 4 months Colon Branch, MD [place] at [facility], [facility]          metoprolol succinate (TOPROL-XL) 25 MG 24 hr tablet 90 tablet 1    Sig: Take 1 tablet (25 mg total) by mouth daily.     Cardiovascular:  Beta Blockers Passed - 04/14/2018  5:21 PM      Passed - Last BP in normal range     BP Readings from Last 1 Encounters:  04/13/18 128/84         Passed - Last Heart Rate in normal range    Pulse Readings from Last 1 Encounters:  04/13/18 68         Passed - Valid encounter within last 6 months    Recent Outpatient Visits          Yesterday Essential hypertension   [facility] at [place], [place]   3 months ago Essential hypertension   [facility] at [place], [place]   8 months ago Edema of both legs   [facility] at [place], [place]   1 year ago Right foot pain   [facility] at [place], [place]   1 year ago Essential hypertension   [facility] at [place], [place]      Future Appointments            In 4 months Colon Branch, MD [place] at [facility], [facility]          lovastatin (MEVACOR) 20 MG tablet 90 tablet 1    Sig: Take 1 tablet (20 mg total) by mouth at bedtime.     Cardiovascular:  Antilipid - Statins Failed - 04/14/2018  5:21 PM      Failed - LDL in normal range and within 360 days    LDL Cholesterol  Date Value Ref Range Status  04/13/2018 104 (H) 0 - 99 mg/dL Final         Passed - Total Cholesterol in normal range and within 360 days    Cholesterol  Date Value Ref Range Status  04/13/2018 178 0 - 200 mg/dL Final    Comment:    ATP III Classification       Desirable:  < 200 mg/dL               Borderline High:  200 - 239 mg/dL          High:  > =  240 mg/dL         Passed - HDL in normal range and within 360 days    HDL  Date Value Ref Range Status  04/13/2018 65.00 >39.00 mg/dL Final         Passed - Triglycerides in normal range and within 360 days    Triglycerides  Date Value Ref Range Status  04/13/2018 47.0 0.0 - 149.0 mg/dL Final    Comment:    Normal:  <150 mg/dLBorderline High:  150 - 199 mg/dL          Passed - Patient is not pregnant      Passed - Valid encounter within last 12 months    Recent Outpatient Visits          Yesterday Essential hypertension   [facility] at [place], [place]   3 months ago Essential hypertension   [facility] at [place], [place]   8 months ago Edema of both legs   [facility] at [place], [place]   1 year ago Right foot pain   [facility] at [place], [place]   1 year ago Essential hypertension   [facility] at [place], [place]      Future Appointments            In 4 months Colon Branch, MD [place] at [facility]          lisinopril-hydrochlorothiazide (PRINZIDE,ZESTORETIC) 20-12.5 MG tablet 90 tablet 1    Sig: Take 1 tablet by mouth 2 (two) times daily.     Cardiovascular:  ACEI + Diuretic Combos Passed - 04/14/2018  5:21 PM      Passed - Na in normal range and within 180 days    Sodium  Date Value Ref Range Status  04/13/2018 140 135 - 145 mEq/L Final         Passed - K in normal range and within 180 days    Potassium  Date Value Ref Range Status  04/13/2018 4.0 3.5 - 5.1 mEq/L Final         Passed - Cr in normal range and within 180 days    Creat  Date Value Ref Range Status  10/28/2014 0.70 0.50 - 1.10 mg/dL Final   Creatinine, Ser  Date Value Ref Range Status  04/13/2018 0.92 0.40 - 1.20 mg/dL Final         Passed - Ca in normal range and within 180 days    Calcium  Date Value Ref Range Status  04/13/2018 9.8 8.4 - 10.5 mg/dL Final         Passed - Patient is not pregnant      Passed - Last BP in normal range    BP Readings from Last 1 Encounters:  04/13/18 128/84         Passed - Valid encounter within last 6 months    Recent Outpatient Visits          Yesterday Essential hypertension    [facility] at [place], [place]   3 months ago Essential hypertension   [facility] at [place], [place]   8 months ago Edema of both legs   [facility] at [facility]  Colon Branch, [place]   1 year ago Right foot pain   [facility] at [place], [place]   1 year ago Essential hypertension   [facility] at [place], [place]      Future Appointments            In 4 months Colon Branch, MD [facility] at [place]

## 2018-04-14 NOTE — Assessment & Plan Note (Signed)
DM: Currently on metformin, check A1c. HTN: Amlodipine recently discontinued, on lisinopril HCTZ, metoprolol; no ambulatory BPs ; get a CMP and CBC High cholesterol: On lovastatin: Check a FLP. Lower extremity edema: Better after amlodipine discontinued. Carotid artery disease: Had a [employer] screening, screening positive for carotid artery disease, will do a formal ultrasound. Left carpal tunnel syndrome: The patient has a long history of numbness at the second finger, on exam she has thenar hypotrophy, previously has seen a [employer] doctor who ran some tests but she was told they could not perform any treatment because it was not work-related.  Refer to Ortho hand. Preventive care reviewed RTC 4 months

## 2018-04-14 NOTE — Telephone Encounter (Signed)
Patient asking for new prescriptions to be sent to her new pharmacy for:   Accu-Chek Guide Blood Glucose Meter, accu chek guide lancets, Accu-Chek Guide test strips.  Pharmacy: [facility] [address] [place] - [address] [phone] (Phone) [phone] (Fax)

## 2018-04-14 NOTE — Telephone Encounter (Signed)
Copied from CRM [id number]. Topic: Quick Communication - Rx Refill/Question >> Apr 14, 2018  2:59 PM Jaquita Rector A wrote: Medication: metoprolol succinate (TOPROL-XL) 25 MG 24 hr tablet, metFORMIN (GLUCOPHAGE) 1000 MG tablet, lovastatin (MEVACOR) 20 MG tablet, lisinopril-hydrochlorothiazide (PRINZIDE,ZESTORETIC) 20-12.5 MG tablet , Accu-Chek Guide Blood Glucose Meter, accu chek guide lancets, Accu-Chek Guide test strips   Patient need Rx's sent to this new pharmacy  Has the patient contacted their pharmacy? Yes.   (Agent: If no, request that the patient contact the pharmacy for the refill.) (Agent: If yes, when and what did the pharmacy advise?)  Preferred Pharmacy (with phone number or street name): [facility] [place], [place] - [address] [phone] (Phone) [phone] (Fax)    Agent: Please be advised that RX refills may take up to 3 business days. We ask that you follow-up with your pharmacy.

## 2018-04-16 ENCOUNTER — Encounter (INDEPENDENT_AMBULATORY_CARE_PROVIDER_SITE_OTHER): Payer: Self-pay | Admitting: Orthopedic Surgery

## 2018-04-16 NOTE — Progress Notes (Signed)
Office Visit Note   Patient: Lisa Meyers           Date of Birth: 1955/11/02           MRN: [id number] Visit Date: 04/14/2018 Requested by: Wanda Plump, MD [address] [place], [place] [address] PCP: Wanda Plump, MD  Subjective: Chief Complaint  Patient presents with  . Right Knee - Pain  . Left Knee - Pain    HPI: Patient presents with bilateral knee pain.  She was seen at [facility] urgent care 02/08/2018.  Radiographs obtained at that time showed fairly minimal arthritis.  She is concerned about "swelling" in both of her knees.  Once in a while she feels a sharp pain but otherwise she does reasonably well.  She is using knee-high compression stocking.  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              ROS: All systems reviewed are negative as they relate to the chief complaint within the history of present illness.  Patient denies  fevers or chills.   Assessment & Plan: Visit Diagnoses:  1. Chronic pain of both knees     Plan: Impression is relatively normal radiographs and examination of both knees.  No effusion today.  She does have soft tissue envelope which is prominent around both knees.  No significant patellar apprehension or patellofemoral grinding or crepitus.  Collateral and cruciate ligaments are stable.  Plan at this time is observation.  I do not think there is a significant pathologic process going on in the knee at this time on the right or left hand side.  I will see her back as needed  Follow-Up Instructions: Return if symptoms worsen or fail to improve.   Orders:  No orders of the defined types were placed in this encounter.  No orders of the defined types were placed in this encounter.     Procedures: No procedures performed   Clinical Data: No additional findings.  Objective: Vital Signs: There were no vitals taken for this visit.  Physical Exam:   Constitutional: Patient appears  well-developed HEENT:  Head: Normocephalic Eyes:EOM are normal Neck: Normal range of motion Cardiovascular: Normal rate Pulmonary/chest: Effort normal Neurologic: Patient is alert Skin: Skin is warm Psychiatric: Patient has normal mood and affect    Ortho Exam: Ortho exam demonstrates full active and passive range of motion of both knees.  Collateral and cruciate ligaments are stable.  Extensor mechanism is intact.  No patellofemoral crepitus is present.  No other masses lymphadenopathy or skin changes noted in the bilateral knee region.  Specialty Comments:  No specialty comments available.  Imaging: No results found.   PMFS History: Patient Active Problem List   Diagnosis Date Noted  . Annual physical exam 11/26/2016  . PCP NOTES >>>>>>>>>>>>> 05/31/2016  . Hypertension 02/11/2014  . Diabetes mellitus without complication (HCC) 02/11/2014  . Hyperlipidemia 02/11/2014  . 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 Diabetes mellitus without complication (HCC)   . Hyperlipidemia 02/11/2014  . Hypertension   . Neck pain   . 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 Hypertension Mother   . Hypertension Father   . Heart disease Father   . Diabetes Maternal Aunt   . Colon cancer Neg Hx   . Breast cancer Neg  Hx   . CAD Neg Hx     Past Surgical History:  Procedure Laterality Date  . ABDOMINAL HYSTERECTOMY     abdominal, no oophorectomy  . BREAST CYST EXCISION    . CYST REMOVAL LEG  07-2013   inner L thigh, Dr Vickki Muff[place]on, [name]    Social History   Occupational History  . 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 Smoking status: Never Smoker  . Smokeless tobacco: Never Used  Substance and Sexual Activity  . Alcohol use: Yes    Alcohol/week: 0.0 standard drinks    Comment: wine, rare   . Drug use: No  . Sexual activity: Not on file

## 2018-04-17 MED ORDER — GLUCOSE BLOOD VI STRP: ORAL_STRIP | 12 refills | Status: DC

## 2018-04-17 MED ORDER — METFORMIN HCL 1000 MG PO TABS: 1000.0000 mg | ORAL_TABLET | Freq: Two times a day (BID) | ORAL | 1 refills | Status: DC

## 2018-04-17 NOTE — Addendum Note (Signed)
Addended byConrad [place] D on: 04/17/2018 01:18 PM   Modules accepted: Orders

## 2018-04-18 ENCOUNTER — Telehealth: Payer: Self-pay | Admitting: *Deleted

## 2018-04-18 ENCOUNTER — Other Ambulatory Visit: Payer: Self-pay | Admitting: *Deleted

## 2018-04-18 MED ORDER — METFORMIN HCL 1000 MG PO TABS: 1000.0000 mg | ORAL_TABLET | Freq: Two times a day (BID) | ORAL | 1 refills | Status: DC

## 2018-04-18 NOTE — Addendum Note (Signed)
Addended byConrad [place] D on: 04/18/2018 01:03 PM   Modules accepted: Orders

## 2018-04-18 NOTE — Telephone Encounter (Signed)
Please let the patient know, amlodipine was stopped due to edema.  If she develops edema again let me know. Also update her medication list

## 2018-04-18 NOTE — Telephone Encounter (Signed)
FYI

## 2018-04-18 NOTE — Telephone Encounter (Signed)
Patient called to get her lab results- while talking to her on the phone- she states she is having trouble with her BP medication.  Patient states she is hearing her heartbeat in her ears when she is taking the Metoprolol. This is concerning to her and she has switched back to the Amlodipine she had left. Told patient I would let her provider know this so he could decide what needs to be done as far as her medication management.

## 2018-04-18 NOTE — Telephone Encounter (Signed)
Med list updated. LMOM informing Pt of below.

## 2018-04-19 ENCOUNTER — Encounter (HOSPITAL_COMMUNITY): Payer: Medicare Other

## 2018-04-20 ENCOUNTER — Other Ambulatory Visit (INDEPENDENT_AMBULATORY_CARE_PROVIDER_SITE_OTHER): Payer: Medicare Other

## 2018-04-20 DIAGNOSIS — Z1211 Encounter for screening for malignant neoplasm of colon: Secondary | ICD-10-CM

## 2018-04-20 LAB — FECAL OCCULT BLOOD, IMMUNOCHEMICAL: Fecal Occult Bld: NEGATIVE

## 2018-04-25 ENCOUNTER — Ambulatory Visit: Payer: Self-pay

## 2018-04-25 NOTE — Telephone Encounter (Signed)
Ok, thx

## 2018-04-25 NOTE — Telephone Encounter (Signed)
Please schedule visit to discuss meds at her convenience.

## 2018-04-25 NOTE — Telephone Encounter (Signed)
Patient called and says that she's hearing her heart beat in her ears since starting on Metoprolol. She says that when she first started taking the Metoprolol, she felt her heart beating hard, thumping in her chest, then had left side chest pain off and on for a week. She says she is not having those pains in her chest now, but is more concerned now about the heart beat in her ears. She says that she took her Metformin and Lisinopril 20-HCTZ 12.5 mg this morning at breakfast, then had the heart beating in her ears, so she took another Lisinopril-HCTZ. I advised that Lisinopril-HCTZ is not on her list. She says she was prescribed that, so she could stop the Amlodipine. Patient appeared to be mixed up with the times to take her medications. I asked her to read the bottles of Metformin and Lisinopril-HCTZ, she says it says to take 1 pill twice a day. I advised to take them with breakfast and again at supper. I advised to take the Metoprolol in the morning at breakfast, since it's once a day. I advised to take the Lovastatin at bedtime as ordered. She verbalized understanding of the medication administration times after reviewing several times, she verbally told me when to take each of her medications. She says now the heart beat in her ear is going away since taking the Lisinopril-HCTZ twice in the morning. I advised not to take it any more today, since she's already taken it twice, she verbalized understanding. I advised that I will send this to Dr. Drue Novel for review and someone will call back with his recommendation concerning the heart beat in the ears, she verbalized understanding.  BP Readings: Sunday 117/79, Monday 127/82, today 153/79 HR Readings: ranges from 72-85 as patient gave various readings.   Reason for Disposition . Caller has NON-URGENT medication question about med that PCP prescribed and triager unable to answer question  Protocols used: MEDICATION QUESTION CALL-A-AH

## 2018-04-25 NOTE — Telephone Encounter (Signed)
Please to schedule the patient for office visit

## 2018-04-25 NOTE — Telephone Encounter (Signed)
Called Lisa Meyers and explained that Dr. Drue Novel would like to see her in the office to discuss her medication concerns. Patient stated that she did not want to come into see him again at this time for financial reasons. Patient stated that the triage nurse explained to her the correct way to take the medicine and she is feeling better. Patient stated that she would call back and update Dr. Drue Novel further on how she is feeling tomorrow.

## 2018-05-08 ENCOUNTER — Encounter: Payer: Medicare Other | Admitting: Obstetrics & Gynecology

## 2018-05-08 ENCOUNTER — Inpatient Hospital Stay (HOSPITAL_BASED_OUTPATIENT_CLINIC_OR_DEPARTMENT_OTHER): Admission: RE | Admit: 2018-05-08 | Payer: Medicare Other | Source: Ambulatory Visit

## 2018-05-08 DIAGNOSIS — Z01419 Encounter for gynecological examination (general) (routine) without abnormal findings: Secondary | ICD-10-CM

## 2018-06-05 ENCOUNTER — Ambulatory Visit (HOSPITAL_BASED_OUTPATIENT_CLINIC_OR_DEPARTMENT_OTHER): Payer: Medicare Other

## 2018-06-09 ENCOUNTER — Other Ambulatory Visit: Payer: Self-pay | Admitting: Internal Medicine

## 2018-06-09 ENCOUNTER — Ambulatory Visit (HOSPITAL_COMMUNITY)
Admission: RE | Admit: 2018-06-09 | Discharge: 2018-06-09 | Disposition: A | Discharge status: Home / Self Care | Payer: Medicare Other | Source: Ambulatory Visit | Attending: Cardiology | Admitting: Cardiology

## 2018-06-09 DIAGNOSIS — I6529 Occlusion and stenosis of unspecified carotid artery: Secondary | ICD-10-CM | POA: Diagnosis not present

## 2018-06-14 ENCOUNTER — Telehealth: Payer: Self-pay | Admitting: Internal Medicine

## 2018-06-14 NOTE — Telephone Encounter (Signed)
Pt called back but hung up while I was trying to get [facility] NT on the phone.  Per NT there is nothing documented for them to discuss with pt

## 2018-06-14 NOTE — Telephone Encounter (Addendum)
LMOM informing Pt to return call. Okay for [facility] to discuss.  

## 2018-06-14 NOTE — Telephone Encounter (Signed)
Copied from CRM [id number]. Topic: Quick Communication - See Telephone Encounter >> Jun 14, 2018 12:12 PM Terisa Starr wrote: CRM for notification. See Telephone encounter for: 06/14/18.  Patient would like the nurse to call her because metoprolol succinate (TOPROL-XL) 25 MG 24 hr tablet is making her ears hurt. Please advise.

## 2018-06-14 NOTE — Telephone Encounter (Signed)
Tried calling Pt back- no answer/hang up.

## 2018-06-21 ENCOUNTER — Other Ambulatory Visit: Payer: Self-pay

## 2018-06-21 ENCOUNTER — Telehealth: Payer: Self-pay | Admitting: Internal Medicine

## 2018-06-21 ENCOUNTER — Ambulatory Visit (INDEPENDENT_AMBULATORY_CARE_PROVIDER_SITE_OTHER): Payer: Medicare Other | Admitting: Internal Medicine

## 2018-06-21 ENCOUNTER — Other Ambulatory Visit (HOSPITAL_COMMUNITY)
Admission: RE | Admit: 2018-06-21 | Discharge: 2018-06-21 | Disposition: A | Discharge status: Home / Self Care | Payer: Medicare Other | Source: Ambulatory Visit | Attending: Obstetrics and Gynecology | Admitting: Obstetrics and Gynecology

## 2018-06-21 ENCOUNTER — Encounter: Payer: Self-pay | Admitting: Internal Medicine

## 2018-06-21 ENCOUNTER — Ambulatory Visit (INDEPENDENT_AMBULATORY_CARE_PROVIDER_SITE_OTHER): Payer: Medicare Other | Admitting: Obstetrics and Gynecology

## 2018-06-21 ENCOUNTER — Ambulatory Visit (HOSPITAL_BASED_OUTPATIENT_CLINIC_OR_DEPARTMENT_OTHER)
Admission: RE | Admit: 2018-06-21 | Discharge: 2018-06-21 | Disposition: A | Discharge status: Home / Self Care | Payer: Medicare Other | Source: Ambulatory Visit | Attending: Internal Medicine | Admitting: Internal Medicine

## 2018-06-21 ENCOUNTER — Encounter: Payer: Self-pay | Admitting: Obstetrics and Gynecology

## 2018-06-21 VITALS — Resp 16 | Ht 65.0 in | Wt 216.0 lb

## 2018-06-21 VITALS — BP 135/77 | HR 70 | Ht 65.0 in | Wt 212.1 lb

## 2018-06-21 DIAGNOSIS — I10 Essential (primary) hypertension: Secondary | ICD-10-CM | POA: Diagnosis not present

## 2018-06-21 DIAGNOSIS — I6529 Occlusion and stenosis of unspecified carotid artery: Secondary | ICD-10-CM

## 2018-06-21 DIAGNOSIS — Z01419 Encounter for gynecological examination (general) (routine) without abnormal findings: Secondary | ICD-10-CM

## 2018-06-21 DIAGNOSIS — Z0000 Encounter for general adult medical examination without abnormal findings: Secondary | ICD-10-CM

## 2018-06-21 DIAGNOSIS — N898 Other specified noninflammatory disorders of vagina: Secondary | ICD-10-CM

## 2018-06-21 DIAGNOSIS — R600 Localized edema: Secondary | ICD-10-CM | POA: Diagnosis not present

## 2018-06-21 DIAGNOSIS — Z1231 Encounter for screening mammogram for malignant neoplasm of breast: Secondary | ICD-10-CM | POA: Diagnosis not present

## 2018-06-21 DIAGNOSIS — R234 Changes in skin texture: Secondary | ICD-10-CM

## 2018-06-21 DIAGNOSIS — Z90710 Acquired absence of both cervix and uterus: Secondary | ICD-10-CM

## 2018-06-21 MED ORDER — HYDROCHLOROTHIAZIDE 25 MG PO TABS: 25.0000 mg | ORAL_TABLET | Freq: Every day | ORAL | 6 refills | Status: DC

## 2018-06-21 MED ORDER — LISINOPRIL 40 MG PO TABS: 40.0000 mg | ORAL_TABLET | Freq: Every day | ORAL | 6 refills | Status: DC

## 2018-06-21 NOTE — Patient Instructions (Addendum)
See you in MAY  Stop the combination of lisinopril HCTZ  Stop metoprolol  Take hydrochlorothiazide 25 mg: 1 tablet in the morning  Take lisinopril 40 mg 1 tablet later on the day  Check the  blood pressure  daily at different times   Be sure your blood pressure is between 110/65 and  135/85. If it is consistently higher or lower, let me know   HOW TO TAKE YOUR BLOOD PRESSURE:   Rest 5 minutes before taking your blood pressure.   Don't smoke or drink caffeinated beverages for at least 30 minutes before.   Take your blood pressure before (not after) you eat.   Sit comfortably with your back supported and both feet on the floor (don't cross your legs).   Elevate your arm to heart level on a table or a desk.   Use the proper sized cuff. It should fit smoothly and snugly around your bare upper arm. There should be enough room to slip a fingertip under the cuff. The bottom edge of the cuff should be 1 inch above the crease of the elbow.   Ideally, take 3 measurements at one sitting and record the average.

## 2018-06-21 NOTE — Telephone Encounter (Signed)
I tried calling Pt regarding this last week. She did not return call. Needs appt to discuss.

## 2018-06-21 NOTE — Telephone Encounter (Signed)
Patient is being seen today by a nurse for a bp check.

## 2018-06-21 NOTE — Telephone Encounter (Signed)
Patient came into the office stating she is unhappy with the side effects of metoprolol. She was expressing that she wants to stop taking the medication as it is making her miserable. Please advise.

## 2018-06-21 NOTE — Progress Notes (Signed)
Subjective:    Patient ID: Lisa Meyers, female    DOB: 1955/06/10, 63 y.o.   MRN: [id number]  DOS:  06/21/2018 Type of visit - description: Acute visit She is here for hypertension management. Last office visit was 04/13/2018, at the time amlodipine was discontinue due to lower extremity edema, she continue with lisinopril,, HCTZ and metoprolol.  She is here because thinks the metoprolol is causing side effects: She feels that her ears are "filled with water", her ears hurt and particularly at night they  "palpitate". She is also upset because her BP is "not the same every time I check" Her blood pressure is mostly within range, one time was in the 150s and she took left over amlodipine Today at her gynecology visit it was 135/77.   Review of Systems Occasional lower extremity edema Past Medical History:  Diagnosis Date  . Diabetes mellitus without complication (HCC)   . Hyperlipidemia 02/11/2014  . Hypertension   . Neck pain   . 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 ABDOMINAL HYSTERECTOMY     abdominal, no oophorectomy  . BREAST CYST EXCISION    . CYST REMOVAL LEG  07-2013   inner L thigh, Dr Vickki Muff, Claris Gower     Social History   Socioeconomic History  . Marital status: Single    Spouse name: Not on file  . Number of children: 1  . Years of education: Not on file  . Highest education level: Not on file  Occupational History  . Occupation: CNA -- Health and safety inspector  . Financial resource strain: Not on file  . Food insecurity:    Worry: Not on file    Inability: Not on file  . Transportation needs:    Medical: Not on file    Non-medical: Not on file  Tobacco Use  . Smoking status: Never Smoker  . Smokeless tobacco: Never Used  Substance and Sexual Activity  . Alcohol use: Yes    Alcohol/week: 0.0 standard drinks    Comment: wine, rare   . Drug use: No  . Sexual activity: Not on file  Lifestyle  . Physical  activity:    Days per week: Not on file    Minutes per session: Not on file  . Stress: Not on file  Relationships  . Social connections:    Talks on phone: Not on file    Gets together: Not on file    Attends religious service: Not on file    Active member of club or organization: Not on file    Attends meetings of clubs or organizations: Not on file    Relationship status: Not on file  . Intimate partner violence:    Fear of current or ex partner: Not on file    Emotionally abused: Not on file    Physically abused: Not on file    Forced sexual activity: Not on file  Other Topics Concern  . Not on file  Social History Narrative   Born in [place]   Live in [place] x years, then [place], moved to [place] 2015   Lives by herself      Allergies as of 06/21/2018   No Known Allergies     Medication List       Accurate as of June 21, 2018  3:15 PM. Always use your most recent med list.        amLODipine 10  MG tablet Commonly known as:  NORVASC Take 10 mg by mouth daily.   DAILY MULTIVITAMIN PO Take by mouth.   glucose blood test strip Check blood sugar once daily   lisinopril-hydrochlorothiazide 20-12.5 MG tablet Commonly known as:  PRINZIDE,ZESTORETIC Take 1 tablet by mouth daily.   lovastatin 20 MG tablet Commonly known as:  MEVACOR Take 1 tablet (20 mg total) by mouth at bedtime.   metFORMIN 1000 MG tablet Commonly known as:  GLUCOPHAGE Take 1 tablet (1,000 mg total) by mouth 2 (two) times daily with a meal.   metoprolol succinate 25 MG 24 hr tablet Commonly known as:  TOPROL-XL Take 1 tablet (25 mg total) by mouth daily.   vitamin C 100 MG tablet Take 100 mg by mouth daily.   VITAMIN D (ERGOCALCIFEROL) PO Take by mouth.           Objective:   Physical Exam Resp 16   Ht 5\' 5"  (1.651 m)   Wt 216 lb (98 kg)   SpO2 99%   BMI 35.94 kg/m  General:   Well developed, NAD, BMI noted. HEENT:  Normocephalic . Face symmetric, atraumatic. TMs  normal Neck: Normal carotid pulses  lungs:  CTA B Normal respiratory effort, no intercostal retractions, no accessory muscle use. Heart: RRR,  no murmur.  No pretibial edema bilaterally  Skin: Not pale. Not jaundice Neurologic:  alert & oriented X3.  Speech normal, gait appropriate for age and unassisted Psych--  Cognition and judgment appear intact.  Cooperative with normal attention span and concentration.  Behavior appropriate. She seems apprehensive, no depressed appearing.      Assessment     Assessment DM HTN. Amlodipine d/c 2019 d/t swelling  Hyperlipidemia + PPD 1990s + PPD 09/03/2015, 16 mm, x-ray negative, completed 3 months of INH 12/11/2015 at [facility]  PLAN: HTN: Patient was a started on metoprolol 04/14/2018, since then reports that her BP is not as well-controlled as when she was taking amlodipine which was discontinued due to edema. She is currently taking lisinopril HCT 20-12.5 mg 1 tablet twice a day. Pt thinks metoprolol is causing a pulsatile sensation in the ears and ear pain. We agreed to stop metoprolol. She declined to take lisinopril HCT 20-12.5 mg 2 tablets in the morning because in the past that drove her BP too low "in the 99/66 range". Consequently we agreed to take HCTZ 25 mg in the morning and lisinopril 40 mg in the afternoon. We will monitor BPs, if they are not within a good range we will add a medication (a different beta-blocker?). I also advised  her BP does not need to be exactly the same number every time she checks. Edema?  She reports edema today, exam is essentially negative. Otherwise follow-up in May as scheduled   Today, I spent more than  25  min with the patient: >50% of the time counseling regards her perceived side effects from metoprolol, they are atypical.  She also thinks that every time she check her blood pressure should be the same; patient is educated, BP can change throughout the day depending on her  level of activity.

## 2018-06-21 NOTE — Progress Notes (Signed)
GYNECOLOGY ANNUAL PREVENTATIVE CARE ENCOUNTER NOTE  Subjective:   Lisa Meyers is a 63 y.o. P66  female here for a annual gynecologic exam. Current complaints: foul odor in vagina.  Uses baby wipes in her vagina. Denies discharge or bleeding.  Denies pelvic pain, or other gynecologic concerns. Not sexually active.   S/p abdominal hysterectomy, pt reports for h/o heavy bleeding. Reports she has had pap smears in past, has never had abnormal. Declines STI screen.   Gynecologic History No LMP recorded. Patient has had a hysterectomy. Contraception: s/p hysterectomy Last Pap: many years ago, pt reports all were normal Last mammogram: 06/21/18. Results were: pending  Obstetric History OB History  Gravida Para Term Preterm AB Living  SAB TAB Ectopic Multiple Live Births          1    # Outcome Date GA Lbr Len/2nd Weight Sex Delivery Anes PTL Lv  1 Para      Vag-Spont   LIV    Past Medical History:  Diagnosis Date  . Diabetes mellitus without complication (HCC)   . Hyperlipidemia 02/11/2014  . Hypertension   . Neck pain   . 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 ABDOMINAL HYSTERECTOMY     abdominal, no oophorectomy  . BREAST CYST EXCISION    . CYST REMOVAL LEG  07-2013   inner L thigh, Dr Otho Perl     Current Outpatient Medications on File Prior to Visit  Medication Sig Dispense Refill  . Ascorbic Acid (VITAMIN C) 100 MG tablet Take 100 mg by mouth daily.    Marland Kitchen lisinopril-hydrochlorothiazide (PRINZIDE,ZESTORETIC) 20-12.5 MG tablet Take 1 tablet by mouth daily.    Marland Kitchen lovastatin (MEVACOR) 20 MG tablet Take 1 tablet (20 mg total) by mouth at bedtime. 90 tablet 1  . metFORMIN (GLUCOPHAGE) 1000 MG tablet Take 1 tablet (1,000 mg total) by mouth 2 (two) times daily with a meal. 180 tablet 1  . metoprolol succinate (TOPROL-XL) 25 MG 24 hr tablet Take 1 tablet (25 mg total) by mouth daily. 90 tablet 1  . Multiple  Vitamins-Minerals (DAILY MULTIVITAMIN PO) Take by mouth.    Marland Kitchen VITAMIN D, ERGOCALCIFEROL, PO Take by mouth.    Marland Kitchen amLODipine (NORVASC) 10 MG tablet Take 10 mg by mouth daily.    Marland Kitchen glucose blood test strip Check blood sugar once daily 100 each 12   No current facility-administered medications on file prior to visit.     No Known Allergies  Social History   Socioeconomic History  . Marital status: Single    Spouse name: Not on file  . Number of children: 1  . Years of education: Not on file  . Highest education level: Not on file  Occupational History  . Occupation: CNA -- Health and safety inspector  . Financial resource strain: Not on file  . Food insecurity:    Worry: Not on file    Inability: Not on file  . Transportation needs:    Medical: Not on file    Non-medical: Not on file  Tobacco Use  . Smoking status: Never Smoker  . Smokeless tobacco: Never Used  Substance and Sexual Activity  . Alcohol use: Yes    Alcohol/week: 0.0 standard drinks    Comment: wine, rare   . Drug use: No  . Sexual activity: Not on file  Lifestyle  .  Physical activity:    Days per week: Not on file    Minutes per session: Not on file  . Stress: Not on file  Relationships  . Social connections:    Talks on phone: Not on file    Gets together: Not on file    Attends religious service: Not on file    Active member of club or organization: Not on file    Attends meetings of clubs or organizations: Not on file    Relationship status: Not on file  . Intimate partner violence:    Fear of current or ex partner: Not on file    Emotionally abused: Not on file    Physically abused: Not on file    Forced sexual activity: Not on file  Other Topics Concern  . Not on file  Social History Narrative   Born in [place]   Live in [place] x years, then [place], moved to [place] 2015   Lives by herself    Family History  Problem Relation Age of Onset  . Hypertension Mother   . Hypertension Father    . Heart disease Father   . Diabetes Maternal Aunt   . Colon cancer Neg Hx   . Breast cancer Neg Hx   . CAD Neg Hx     The following portions of the patient's history were reviewed and updated as appropriate: allergies, current medications, past family history, past medical history, past social history, past surgical history and problem list.  Review of Systems Pertinent items are noted in HPI.   Objective:  BP 135/77   Pulse 70   Ht 5\' 5"  (1.651 m)   Wt 212 lb 1.9 oz (96.2 kg)   BMI 35.30 kg/m  CONSTITUTIONAL: Well-developed, well-nourished female in no acute distress.  HENT:  Normocephalic, atraumatic, External right and left ear normal. Oropharynx is clear and moist EYES: Conjunctivae and EOM are normal. Pupils are equal, round, and reactive to light. No scleral icterus.  NECK: Normal range of motion, supple, no masses.  Normal thyroid.  SKIN: Skin is warm and dry. No rash noted. Not diaphoretic. No erythema. No pallor. NEUROLOGIC: Alert and oriented to person, place, and time. Normal reflexes, muscle tone coordination. No cranial nerve deficit noted. PSYCHIATRIC: Normal mood and affect. Normal behavior. Normal judgment and thought content. CARDIOVASCULAR: Normal heart rate noted, regular rhythm RESPIRATORY: Clear to auscultation bilaterally. Effort and breath sounds normal, no problems with respiration noted. BREASTS: Symmetric in size. No masses,  nipple drainage, or lymphadenopathy. 1.5 cm x 1.5 cm area of skin on left breast at 7 o'clock approx 2 cm from nipple mildly irritated appearing and slightly lighter in color, no dimpling, erythema ABDOMEN: Soft, normal bowel sounds, no distention noted.  No tenderness, rebound or guarding.  PELVIC: Normal appearing external genitalia; mildly atrophic appearing vaginal mucosa, vaginal cuff visible at apex of vagina.  No abnormal discharge noted.  Pelvic cultures obtained. Uterus surgically absent, no adnexal masses or tenderness, ovaries  not palpable. MUSCULOSKELETAL: Normal range of motion. No tenderness.  No cyanosis, clubbing, or edema.  2+ distal pulses.  Assessment and Plan:   1. Well woman exam - UTD for pap/mammogram - Pt has never had colonoscopy, reports her PCP gave her option of doing stool sample.  - Reviewed recommendation for screening colonoscopy, she would like to discuss with PCP  2. Vaginal odor - Benign exam - Wet prep sent  3. Breast skin changes - Appearance of eczema - Recommended hydrocortisone cream -  to return if no improvement 2 months  4. S/P hysterectomy - Cervix surgically absent - No need for pap smears   Will follow up results of pelvic cultures and manage accordingly.  Mammogram UTD Referral for colonoscopy, see above Flu vaccine UTD  Routine preventative health maintenance measures emphasized. Please refer to After Visit Summary for other counseling recommendations.     Baldemar Lenis, M.D. Attending [facility])

## 2018-06-22 LAB — CERVICOVAGINAL ANCILLARY ONLY
Bacterial vaginitis: NEGATIVE
CANDIDA VAGINITIS: NEGATIVE

## 2018-06-23 ENCOUNTER — Telehealth: Payer: Self-pay

## 2018-06-23 NOTE — Assessment & Plan Note (Addendum)
PLAN: HTN: Patient was a started on metoprolol 04/14/2018, since then reports that her BP is not as well-controlled as when she was taking amlodipine which was discontinued due to edema. She is currently taking lisinopril HCT 20-12.5 mg 1 tablet twice a day. Pt thinks metoprolol is causing a pulsatile sensation in the ears and ear pain. We agreed to stop metoprolol. She declined to take lisinopril HCT 20-12.5 mg 2 tablets in the morning because in the past that drove her BP too low "in the 99/66 range". Consequently we agreed to take HCTZ 25 mg in the morning and lisinopril 40 mg in the afternoon. We will monitor BPs, if they are not within a good range we will add a medication (a different beta-blocker?). I also advised  her BP does not need to be exactly the same number every time she checks. Edema?  She reports edema today, exam is essentially negative. Otherwise follow-up in May as scheduled

## 2018-06-23 NOTE — Telephone Encounter (Signed)
-----   Message from Conan Bowens, MD sent at 06/23/2018  8:44 AM EDT ----- Please call pt and let her know negative GC/CT

## 2018-06-23 NOTE — Telephone Encounter (Signed)
Called pt with results. Pt made aware that wet prep was negative. Understanding was voice.  chiquita l wilson, CMA

## 2018-07-27 DIAGNOSIS — M25469 Effusion, unspecified knee: Secondary | ICD-10-CM | POA: Diagnosis not present

## 2018-07-27 DIAGNOSIS — R0789 Other chest pain: Secondary | ICD-10-CM | POA: Diagnosis not present

## 2018-07-27 DIAGNOSIS — B354 Tinea corporis: Secondary | ICD-10-CM | POA: Diagnosis not present

## 2018-07-27 DIAGNOSIS — E119 Type 2 diabetes mellitus without complications: Secondary | ICD-10-CM | POA: Diagnosis not present

## 2018-07-27 DIAGNOSIS — I10 Essential (primary) hypertension: Secondary | ICD-10-CM | POA: Diagnosis not present

## 2018-07-27 DIAGNOSIS — R600 Localized edema: Secondary | ICD-10-CM | POA: Diagnosis not present

## 2018-08-03 DIAGNOSIS — E119 Type 2 diabetes mellitus without complications: Secondary | ICD-10-CM | POA: Diagnosis not present

## 2018-08-03 DIAGNOSIS — B354 Tinea corporis: Secondary | ICD-10-CM | POA: Diagnosis not present

## 2018-08-03 DIAGNOSIS — R0789 Other chest pain: Secondary | ICD-10-CM | POA: Diagnosis not present

## 2018-08-03 DIAGNOSIS — I10 Essential (primary) hypertension: Secondary | ICD-10-CM | POA: Diagnosis not present

## 2018-08-14 ENCOUNTER — Ambulatory Visit: Payer: Medicare Other | Admitting: Internal Medicine

## 2018-09-05 ENCOUNTER — Ambulatory Visit (HOSPITAL_BASED_OUTPATIENT_CLINIC_OR_DEPARTMENT_OTHER): Payer: Medicare Other

## 2018-09-08 DIAGNOSIS — E119 Type 2 diabetes mellitus without complications: Secondary | ICD-10-CM | POA: Diagnosis not present

## 2018-09-08 DIAGNOSIS — B354 Tinea corporis: Secondary | ICD-10-CM | POA: Diagnosis not present

## 2018-09-08 DIAGNOSIS — R0789 Other chest pain: Secondary | ICD-10-CM | POA: Diagnosis not present

## 2018-09-08 DIAGNOSIS — I10 Essential (primary) hypertension: Secondary | ICD-10-CM | POA: Diagnosis not present

## 2018-09-21 DIAGNOSIS — Z20828 Contact with and (suspected) exposure to other viral communicable diseases: Secondary | ICD-10-CM | POA: Diagnosis not present

## 2018-10-16 DIAGNOSIS — E119 Type 2 diabetes mellitus without complications: Secondary | ICD-10-CM | POA: Diagnosis not present

## 2018-10-16 DIAGNOSIS — I10 Essential (primary) hypertension: Secondary | ICD-10-CM | POA: Diagnosis not present

## 2018-10-16 DIAGNOSIS — B354 Tinea corporis: Secondary | ICD-10-CM | POA: Diagnosis not present

## 2018-10-16 DIAGNOSIS — E785 Hyperlipidemia, unspecified: Secondary | ICD-10-CM | POA: Diagnosis not present

## 2018-11-15 ENCOUNTER — Other Ambulatory Visit: Payer: Self-pay | Admitting: Internal Medicine

## 2019-01-11 ENCOUNTER — Other Ambulatory Visit: Payer: Self-pay

## 2019-01-11 MED ORDER — LISINOPRIL 40 MG PO TABS: 40.0000 mg | ORAL_TABLET | Freq: Every day | ORAL | 0 refills | Status: DC

## 2019-02-06 DIAGNOSIS — B354 Tinea corporis: Secondary | ICD-10-CM | POA: Diagnosis not present

## 2019-02-06 DIAGNOSIS — I10 Essential (primary) hypertension: Secondary | ICD-10-CM | POA: Diagnosis not present

## 2019-02-06 DIAGNOSIS — E785 Hyperlipidemia, unspecified: Secondary | ICD-10-CM | POA: Diagnosis not present

## 2019-02-06 DIAGNOSIS — E119 Type 2 diabetes mellitus without complications: Secondary | ICD-10-CM | POA: Diagnosis not present

## 2019-02-09 DIAGNOSIS — H6983 Other specified disorders of Eustachian tube, bilateral: Secondary | ICD-10-CM | POA: Diagnosis not present

## 2019-02-09 DIAGNOSIS — H9313 Tinnitus, bilateral: Secondary | ICD-10-CM | POA: Diagnosis not present

## 2019-02-09 DIAGNOSIS — H903 Sensorineural hearing loss, bilateral: Secondary | ICD-10-CM | POA: Diagnosis not present

## 2019-02-12 ENCOUNTER — Other Ambulatory Visit: Payer: Self-pay | Admitting: Family Medicine

## 2019-02-12 DIAGNOSIS — R109 Unspecified abdominal pain: Secondary | ICD-10-CM

## 2019-02-12 DIAGNOSIS — M25469 Effusion, unspecified knee: Secondary | ICD-10-CM

## 2019-02-12 DIAGNOSIS — E785 Hyperlipidemia, unspecified: Secondary | ICD-10-CM

## 2019-02-12 DIAGNOSIS — B354 Tinea corporis: Secondary | ICD-10-CM

## 2019-02-12 DIAGNOSIS — E049 Nontoxic goiter, unspecified: Secondary | ICD-10-CM

## 2019-02-12 DIAGNOSIS — Z7182 Exercise counseling: Secondary | ICD-10-CM

## 2019-02-12 DIAGNOSIS — J309 Allergic rhinitis, unspecified: Secondary | ICD-10-CM

## 2019-02-12 DIAGNOSIS — B908 Sequelae of tuberculosis of other organs: Secondary | ICD-10-CM

## 2019-02-12 DIAGNOSIS — H6993 Unspecified Eustachian tube disorder, bilateral: Secondary | ICD-10-CM

## 2019-02-12 DIAGNOSIS — E1169 Type 2 diabetes mellitus with other specified complication: Secondary | ICD-10-CM

## 2019-02-12 DIAGNOSIS — I10 Essential (primary) hypertension: Secondary | ICD-10-CM

## 2019-02-12 DIAGNOSIS — Z6833 Body mass index (BMI) 33.0-33.9, adult: Secondary | ICD-10-CM

## 2019-03-15 DIAGNOSIS — Z23 Encounter for immunization: Secondary | ICD-10-CM | POA: Diagnosis not present

## 2019-03-30 ENCOUNTER — Telehealth: Payer: Self-pay

## 2019-03-30 NOTE — Telephone Encounter (Signed)
Copied from [place] [id number]. Topic: General - Other >> Mar 30, 2019 10:32 AM Lisa Meyers wrote: Reason for CRM: Patient returned call for covid screening for appointment on 04/02/2019. Please call her at Ph# [phone]

## 2019-04-02 ENCOUNTER — Encounter: Payer: Self-pay | Admitting: Internal Medicine

## 2019-04-02 ENCOUNTER — Other Ambulatory Visit: Payer: Self-pay

## 2019-04-02 ENCOUNTER — Ambulatory Visit (INDEPENDENT_AMBULATORY_CARE_PROVIDER_SITE_OTHER): Payer: Medicare Other | Admitting: Internal Medicine

## 2019-04-02 VITALS — BP 119/65 | HR 74 | Temp 96.8°F | Resp 18 | Ht 65.0 in | Wt 206.5 lb

## 2019-04-02 DIAGNOSIS — M25562 Pain in left knee: Secondary | ICD-10-CM

## 2019-04-02 DIAGNOSIS — I6529 Occlusion and stenosis of unspecified carotid artery: Secondary | ICD-10-CM | POA: Diagnosis not present

## 2019-04-02 DIAGNOSIS — E669 Obesity, unspecified: Secondary | ICD-10-CM | POA: Diagnosis not present

## 2019-04-02 DIAGNOSIS — M25561 Pain in right knee: Secondary | ICD-10-CM

## 2019-04-02 NOTE — Progress Notes (Signed)
Subjective:    Patient ID: Lisa Meyers, female    DOB: [birth year], 63 y.o.   MRN: [id number]  DOS:  04/02/2019 Type of visit - description: Acute visit The patient's  main concern is "knee swelling". States "I cannot stand to look at them". I ask about pain and she said sometimes they hurt, pain level is 2/10, OTCs  help.   Wt Readings from Last 3 Encounters:  04/02/19 206 lb 8 oz (93.7 kg)  06/21/18 216 lb (98 kg)  06/21/18 212 lb 1.9 oz (96.2 kg)     Review of Systems  Denies fevers, chills, chest pain or difficulty breathing No nausea, vomiting, diarrhea  Past Medical History:  Diagnosis Date  . Diabetes mellitus without complication ([place])   . Hyperlipidemia 02/11/2014  . Hypertension   . Neck pain   . 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 ABDOMINAL HYSTERECTOMY     abdominal, no oophorectomy  . BREAST CYST EXCISION    . CYST REMOVAL LEG  07-2013   inner L thigh, Dr Tommi Rumps, [place] History  . Marital status: Single    Spouse name: Not on file  . Number of children: 1  . Years of education: Not on file  . Highest education level: Not on file  Occupational History  . Occupation: CNA -- Chief Executive [employer]   Tobacco Use  . Smoking status: Never Smoker  . Smokeless tobacco: Never Used  Substance and Sexual Activity  . Alcohol use: Yes    Alcohol/week: 0.0 standard drinks    Comment: wine, rare   . Drug use: No  . Sexual activity: Not on file  Other Topics Concern  . Not on file  Social History Narrative   Born in [place]   Live in [place] x years, then [place], moved to [place] 2015   Lives by herself   Social Determinants of Radio broadcast assistant Strain:   . Difficulty of Paying Living Expenses: Not on file  Food Insecurity:   . Worried About Charity fundraiser in the Last Year: Not on file  . Ran Out of Food in the Last Year: Not on file  Transportation Needs:    . Lack of Transportation (Medical): Not on file  . Lack of Transportation (Non-Medical): Not on file  Physical Activity:   . Days of Exercise per Week: Not on file  . Minutes of Exercise per Session: Not on file  Stress:   . Feeling of Stress : Not on file  Social Connections:   . Frequency of Communication with Friends and Family: Not on file  . Frequency of Social Gatherings with Friends and Family: Not on file  . Attends Religious Services: Not on file  . Active Member of Clubs or Organizations: Not on file  . Attends Archivist Meetings: Not on file  . Marital Status: Not on file  Intimate Partner Violence:   . Fear of Current or Ex-Partner: Not on file  . Emotionally Abused: Not on file  . Physically Abused: Not on file  . Sexually Abused: Not on file      Allergies as of 04/02/2019   No Known Allergies     Medication List       Accurate as of April 02, 2019  2:43 PM. If you have any questions, ask your nurse or doctor.  DAILY MULTIVITAMIN PO Take by mouth.   glucose blood test strip Check blood sugar once daily   hydrochlorothiazide 25 MG tablet Commonly known as: HYDRODIURIL Take 1 tablet (25 mg total) by mouth daily.   lisinopril 40 MG tablet Commonly known as: ZESTRIL Take 1 tablet (40 mg total) by mouth daily.   lovastatin 20 MG tablet Commonly known as: MEVACOR Take 1 tablet (20 mg total) by mouth at bedtime.   metFORMIN 1000 MG tablet Commonly known as: GLUCOPHAGE Take 1 tablet (1,000 mg total) by mouth 2 (two) times daily with a meal.   metoprolol succinate 25 MG 24 hr tablet Commonly known as: TOPROL-XL Take 25 mg by mouth daily.   vitamin C 100 MG tablet Take 100 mg by mouth daily.   VITAMIN D (ERGOCALCIFEROL) PO Take by mouth.           Objective:   Physical Exam BP 119/65 (BP Location: Left Arm, Patient Position: Sitting, Cuff Size: Normal)   Pulse 74   Temp (!) 96.8 F (36 C) (Temporal)   Resp 18   Ht  5\' 5"  (1.651 m)   Wt 206 lb 8 oz (93.7 kg)   SpO2 100%   BMI 34.36 kg/m  General:   Well developed, NAD, BMI noted. HEENT:  Normocephalic . Face symmetric, atraumatic Lungs:  CTA B Normal respiratory effort, no intercostal retractions, no accessory muscle use. Heart: RRR,  no murmur.  No pretibial edema bilaterally  Skin: Not pale. Not jaundice Knees: No redness, swelling, effusion or deformities.  They are symmetric. Neurologic:  alert & oriented X3.  Speech normal, gait appropriate for age and unassisted Psych--  Cognition and judgment appear intact.  Cooperative with normal attention span and concentration.  Behavior appropriate. No anxious or depressed appearing.      Assessment     Assessment DM HTN. Amlodipine d/c 2019 d/t swelling  Hyperlipidemia + PPD 1990s + PPD 09/03/2015, 16 mm, x-ray negative, completed 3 months of INH 12/11/2015 at [facility]   health department  PLAN: Knee pain, "swelling": Status post eval in July @ a urgent care then @ orthopedics, they did not find any major pathology.  On exam there is no swelling at all, she is overweight and she has a lot of adipose tissue around the knee.  After I pointed out that to the patient she understood the problem. Obesity: BMI is 34, she is already doing an effort to lose weight and has lost several pounds.  Recommend to continue her efforts, calorie counting?  Weight watchers?. HTN, diabetes, hyperlipidemia: Reports good compliance with medication, recommend labs, states that she went to another doctor and got that check.  We agreed that she will send me the reports. Advised to return to the office in 3 months if she desires me to be her primary doctor, also recommend physical exam     This visit occurred during the SARS-CoV-2 public health emergency.  Safety protocols were in place, including screening questions prior to the visit, additional usage of staff PPE, and extensive cleaning of exam room while  observing appropriate contact time as indicated for disinfecting solutions.

## 2019-04-02 NOTE — Patient Instructions (Addendum)
Per our records you are due for an eye exam. Please contact your eye doctor to schedule an appointment. Please have them send copies of your office visit notes to [place]. Our fax number is [phone].    GO TO THE FRONT DESK Schedule your next appointment   For a physical exam in 3 months

## 2019-04-02 NOTE — Progress Notes (Signed)
Pre visit review using our clinic review tool, if applicable. No additional management support is needed unless otherwise documented below in the visit note. 

## 2019-04-03 NOTE — Assessment & Plan Note (Signed)
Knee pain, "swelling": Status post eval in July @ a urgent care then @ orthopedics, they did not find any major pathology.  On exam there is no swelling at all, she is overweight and she has a lot of adipose tissue around the knee.  After I pointed out that to the patient she understood the problem. Obesity: BMI is 34, she is already doing an effort to lose weight and has lost several pounds.  Recommend to continue her efforts, calorie counting?  Weight watchers?. HTN, diabetes, hyperlipidemia: Reports good compliance with medication, recommend labs, states that she went to another doctor and got that check.  We agreed that she will send me the reports. Advised to return to the office in 3 months if she desires me to be her primary doctor, also recommend physical exam

## 2019-06-06 ENCOUNTER — Telehealth: Payer: Self-pay

## 2019-06-06 DIAGNOSIS — R69 Illness, unspecified: Secondary | ICD-10-CM | POA: Diagnosis not present

## 2019-06-06 MED ORDER — BLOOD GLUCOSE METER KIT: PACK | 0 refills | Status: AC

## 2019-06-06 NOTE — Telephone Encounter (Signed)
Rx sent 

## 2019-06-06 NOTE — Telephone Encounter (Signed)
Pt called stating she is in need of glucose test strips & needles.  She said the pharmacy is going to supply her with an accuchek meter.  Pharmacy is [phone] [facility].

## 2019-07-09 ENCOUNTER — Other Ambulatory Visit: Payer: Self-pay

## 2019-07-10 ENCOUNTER — Ambulatory Visit (INDEPENDENT_AMBULATORY_CARE_PROVIDER_SITE_OTHER): Payer: Medicare HMO | Admitting: Internal Medicine

## 2019-07-10 ENCOUNTER — Encounter: Payer: Self-pay | Admitting: Internal Medicine

## 2019-07-10 VITALS — BP 117/70 | HR 73 | Temp 96.8°F | Resp 18 | Ht 65.0 in | Wt 202.0 lb

## 2019-07-10 DIAGNOSIS — Z23 Encounter for immunization: Secondary | ICD-10-CM

## 2019-07-10 DIAGNOSIS — E785 Hyperlipidemia, unspecified: Secondary | ICD-10-CM

## 2019-07-10 DIAGNOSIS — I10 Essential (primary) hypertension: Secondary | ICD-10-CM

## 2019-07-10 DIAGNOSIS — G8929 Other chronic pain: Secondary | ICD-10-CM

## 2019-07-10 DIAGNOSIS — M542 Cervicalgia: Secondary | ICD-10-CM

## 2019-07-10 DIAGNOSIS — Z780 Asymptomatic menopausal state: Secondary | ICD-10-CM

## 2019-07-10 DIAGNOSIS — M549 Dorsalgia, unspecified: Secondary | ICD-10-CM | POA: Diagnosis not present

## 2019-07-10 DIAGNOSIS — Z0000 Encounter for general adult medical examination without abnormal findings: Secondary | ICD-10-CM

## 2019-07-10 DIAGNOSIS — Z1231 Encounter for screening mammogram for malignant neoplasm of breast: Secondary | ICD-10-CM

## 2019-07-10 DIAGNOSIS — E119 Type 2 diabetes mellitus without complications: Secondary | ICD-10-CM

## 2019-07-10 NOTE — Progress Notes (Signed)
Subjective:    Patient ID: Lisa Meyers, female    DOB: 09-08-1955, 64 y.o.   MRN: [id number]  DOS:  07/10/2019 Type of visit - description: CPX In addition to CPX she has other concerns and chronic medical issues were discussed. Chronic neck and back pain, denies radiation, she has paresthesias from carpal tunnel syndrome but no other paresthesias.  Wt Readings from Last 3 Encounters:  07/10/19 202 lb (91.6 kg)  04/02/19 206 lb 8 oz (93.7 kg)  06/21/18 216 lb (98 kg)     Review of Systems  Other than above, a 14 point review of systems is negative     Past Medical History:  Diagnosis Date  . Diabetes mellitus without complication ([place])   . Hyperlipidemia 02/11/2014  . Hypertension   . Neck pain   . 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 ABDOMINAL HYSTERECTOMY     abdominal, no oophorectomy  . BREAST CYST EXCISION    . CYST REMOVAL LEG  07-2013   inner L thigh, Dr Meredeth Ide    Family History  Problem Relation Age of Onset  . Hypertension Mother   . Hypertension Father   . Heart disease Father   . Diabetes Maternal Aunt   . Colon cancer Neg Hx   . Breast cancer Neg Hx   . CAD Neg Hx     Allergies as of 07/10/2019   No Known Allergies     Medication List       Accurate as of July 10, 2019 11:59 PM. If you have any questions, ask your nurse or doctor.        blood glucose meter kit and supplies Dispense based on patient and insurance preference. Check blood sugar once daily.   DAILY MULTIVITAMIN PO Take by mouth.   glucose blood test strip Check blood sugar once daily   hydrochlorothiazide 25 MG tablet Commonly known as: HYDRODIURIL Take 1 tablet (25 mg total) by mouth daily.   lisinopril 40 MG tablet Commonly known as: ZESTRIL Take 1 tablet (40 mg total) by mouth daily.   lovastatin 20 MG tablet Commonly known as: MEVACOR Take 1 tablet (20 mg total) by mouth at bedtime.   metFORMIN 1000  MG tablet Commonly known as: GLUCOPHAGE Take 1 tablet (1,000 mg total) by mouth 2 (two) times daily with a meal.   metoprolol succinate 25 MG 24 hr tablet Commonly known as: TOPROL-XL Take 25 mg by mouth daily.   vitamin C 100 MG tablet Take 100 mg by mouth daily.   VITAMIN D (ERGOCALCIFEROL) PO Take by mouth.          Objective:   Physical Exam BP 117/70 (BP Location: Left Arm, Patient Position: Sitting, Cuff Size: Normal)   Pulse 73   Temp (!) 96.8 F (36 C) (Temporal)   Resp 18   Ht _0  (1.651 m)   Wt 202 lb (91.6 kg)   SpO2 100%   BMI 33.61 kg/m  General: Well developed, NAD, BMI noted Neck: No  thyromegaly  HEENT:  Normocephalic . Face symmetric, atraumatic MSK: Neck and lower back no TTP Lungs:  CTA B Normal respiratory effort, no intercostal retractions, no accessory muscle use. Heart: RRR,  no murmur.  Abdomen:  Not distended, soft, non-tender. No rebound or rigidity.   DM foot exam: No edema, pinprick examination normal Skin: Exposed areas without rash. Not pale. Not jaundice Neurologic:  alert &  oriented X3.  Speech normal, gait appropriate for age and unassisted Strength symmetric and appropriate for age.  Psych: Cognition and judgment appear intact.  Cooperative with normal attention span and concentration.  Behavior appropriate. No anxious or depressed appearing.     Assessment      Assessment DM HTN. Amlodipine d/c 2019 d/t swelling  Hyperlipidemia + PPD 1990s + PPD 09/03/2015, 16 mm, x-ray negative, completed 3 months of INH 12/11/2015 at [facility]: Here for CPX DM: I notice she lost some weight, praised!.  Foot exam normal.  Continue Metformin, checking labs. HTN: Ambulatory BPs normal, BP today is very good, continue lisinopril, metoprolol. Hyperlipidemia: On Mevacor, checking labs. Chronic neck, back pain: Going on for a while, reports that when she was living in [place] and had an MRI of the cervical  spine and was told she has DJD (?).  Refer to Ortho. RTC 4 months  In addition to the CPX, I spent more than 22 minutes with the patient discussing diabetes, HTN, high cholesterol and particularly chronic neck and back pain.  This visit occurred during the SARS-CoV-2 public health emergency.  Safety protocols were in place, including screening questions prior to the visit, additional usage of staff PPE, and extensive cleaning of exam room while observing appropriate contact time as indicated for disinfecting solutions.

## 2019-07-10 NOTE — Patient Instructions (Addendum)
Please schedule Medicare Wellness with Lisa Meyers.   Per our records you are due for an eye exam. Please contact your eye doctor to schedule an appointment. Please have them send copies of your office visit notes to [place]. Our fax number is [phone].   Continue checking your blood pressure BP GOAL is between 110/65 and  135/85. If it is consistently higher or lower, let me know   GO TO THE LAB : Get the blood work     GO TO THE FRONT DESK, please reschedule your appointments Come back for   a checkup in 4 months   STOP BY THE FIRST FLOOR: You can schedule your mammogram and a bone density test if you so desire

## 2019-07-10 NOTE — Progress Notes (Signed)
Pre visit review using our clinic review tool, if applicable. No additional management support is needed unless otherwise documented below in the visit note. 

## 2019-07-11 LAB — COMPREHENSIVE METABOLIC PANEL
ALT: 11 U/L (ref 0–35)
AST: 14 U/L (ref 0–37)
Albumin: 4.2 g/dL (ref 3.5–5.2)
Alkaline Phosphatase: 63 U/L (ref 39–117)
BUN: 20 mg/dL (ref 6–23)
CO2: 30 mEq/L (ref 19–32)
Calcium: 9.9 mg/dL (ref 8.4–10.5)
Chloride: 103 mEq/L (ref 96–112)
Creatinine, Ser: 0.81 mg/dL (ref 0.40–1.20)
GFR: 85.55 mL/min (ref >60.00)
Glucose, Bld: 139 mg/dL — ABNORMAL HIGH (ref 70–99)
Potassium: 3.6 mEq/L (ref 3.5–5.1)
Sodium: 140 mEq/L (ref 135–145)
Total Bilirubin: 0.5 mg/dL (ref 0.2–1.2)
Total Protein: 7.2 g/dL (ref 6.0–8.3)

## 2019-07-11 LAB — CBC WITH DIFFERENTIAL/PLATELET
Basophils Absolute: 0 10*3/uL (ref 0.0–0.1)
Basophils Relative: 1.1 % (ref 0.0–3.0)
Eosinophils Absolute: 0.1 10*3/uL (ref 0.0–0.7)
Eosinophils Relative: 2 % (ref 0.0–5.0)
HCT: 36.4 % (ref 36.0–46.0)
Hemoglobin: 12.1 g/dL (ref 12.0–15.0)
Lymphocytes Relative: 54 % — ABNORMAL HIGH (ref 12.0–46.0)
Lymphs Abs: 1.9 10*3/uL (ref 0.7–4.0)
MCHC: 33.2 g/dL (ref 30.0–36.0)
MCV: 91.1 fl (ref 78.0–100.0)
Monocytes Absolute: 0.4 10*3/uL (ref 0.1–1.0)
Monocytes Relative: 11.8 % (ref 3.0–12.0)
Neutro Abs: 1.1 10*3/uL — ABNORMAL LOW (ref 1.4–7.7)
Neutrophils Relative %: 31.1 % — ABNORMAL LOW (ref 43.0–77.0)
Platelets: 230 10*3/uL (ref 150.0–400.0)
RBC: 4 Mil/uL (ref 3.87–5.11)
RDW: 13.4 % (ref 11.5–15.5)
WBC: 3.5 10*3/uL — ABNORMAL LOW (ref 4.0–10.5)

## 2019-07-11 LAB — LIPID PANEL
Cholesterol: 189 mg/dL (ref 0–200)
HDL: 59.3 mg/dL (ref >39.00)
LDL Cholesterol: 116 mg/dL — ABNORMAL HIGH (ref 0–99)
NonHDL: 129.29
Total CHOL/HDL Ratio: 3
Triglycerides: 65 mg/dL (ref 0.0–149.0)
VLDL: 13 mg/dL (ref 0.0–40.0)

## 2019-07-11 LAB — HEMOGLOBIN A1C: Hgb A1c MFr Bld: 7 % — ABNORMAL HIGH (ref 4.6–6.5)

## 2019-07-11 NOTE — Assessment & Plan Note (Signed)
Here for CPX DM: I notice she lost some weight, praised!.  Foot exam normal.  Continue Metformin, checking labs. HTN: Ambulatory BPs normal, BP today is very good, continue lisinopril, metoprolol. Hyperlipidemia: On Mevacor, checking labs. Chronic neck, back pain: Going on for a while, reports that when she was living in [place] and had an MRI of the cervical spine and was told she has DJD (?).  Refer to Ortho. RTC 4 months

## 2019-07-11 NOTE — Assessment & Plan Note (Signed)
-  Td 11-2016 -PNM 13 today -PNM 23 in 1 year   -shingres RX by the last year - s/p Covid vaccination -Female care  Last MMG 06-2018.  She is very reluctant to proceed with other MMG, benefits discussed, order placed Gynecology visit: 06/2018, had a pelvic exam, no need for Paps. -CCS: Stool test negative last year, again 3 modalities discussed, elected I FOB -Diet and exercise recommend -DEXA discussed, order entered.

## 2019-07-18 ENCOUNTER — Telehealth: Payer: Self-pay | Admitting: Internal Medicine

## 2019-07-18 NOTE — Telephone Encounter (Signed)
Please advise 

## 2019-07-18 NOTE — Telephone Encounter (Addendum)
Advise patient: Diabetes needs better control, add Jardiance 10 mg 1 tablet daily in the morning.  #30 and 3 refills Cholesterol needs better control, correction, she is supposed to be on lovastatin Arrange labs in 6 weeks: FLP, CMP, DX high cholesterol. Next follow-up should be in about 4 months.

## 2019-07-18 NOTE — Telephone Encounter (Signed)
Okay, A1c and cholesterol not at goal likely due to poor compliance.  Will reassess on RTC.

## 2019-07-18 NOTE — Telephone Encounter (Signed)
Patient came into office to get lab results for her blood work . Patient states she cant get into her mychart to view results  . Please call to advise

## 2019-07-18 NOTE — Telephone Encounter (Signed)
Spoke w/ Pt-- informed her of lab results and recommendations. She declines starting any other medication--that her A1c of 7.0 isn't that high- she then states that she has not been taking her lovastatin or DM meds like she should have. She states she will start taking them as prescribed. She has a follow-up appt w/ PCP in 10/2019.

## 2019-07-23 ENCOUNTER — Other Ambulatory Visit: Payer: Self-pay

## 2019-07-23 ENCOUNTER — Ambulatory Visit (HOSPITAL_BASED_OUTPATIENT_CLINIC_OR_DEPARTMENT_OTHER)
Admission: RE | Admit: 2019-07-23 | Discharge: 2019-07-23 | Disposition: A | Discharge status: Home / Self Care | Payer: Medicare HMO | Source: Ambulatory Visit | Attending: Internal Medicine | Admitting: Internal Medicine

## 2019-07-23 ENCOUNTER — Encounter (HOSPITAL_BASED_OUTPATIENT_CLINIC_OR_DEPARTMENT_OTHER): Payer: Self-pay

## 2019-07-23 DIAGNOSIS — Z1231 Encounter for screening mammogram for malignant neoplasm of breast: Secondary | ICD-10-CM | POA: Diagnosis not present

## 2019-07-23 DIAGNOSIS — Z780 Asymptomatic menopausal state: Secondary | ICD-10-CM | POA: Diagnosis not present

## 2019-07-23 DIAGNOSIS — E2839 Other primary ovarian failure: Secondary | ICD-10-CM | POA: Diagnosis not present

## 2019-07-23 DIAGNOSIS — Z13820 Encounter for screening for osteoporosis: Secondary | ICD-10-CM | POA: Diagnosis not present

## 2019-07-27 ENCOUNTER — Ambulatory Visit: Payer: Medicare HMO | Admitting: Orthopaedic Surgery

## 2019-07-30 ENCOUNTER — Other Ambulatory Visit: Payer: Self-pay

## 2019-07-30 ENCOUNTER — Ambulatory Visit: Payer: Self-pay

## 2019-07-30 ENCOUNTER — Ambulatory Visit: Payer: Medicare HMO | Admitting: Orthopaedic Surgery

## 2019-07-30 ENCOUNTER — Encounter: Payer: Self-pay | Admitting: Orthopaedic Surgery

## 2019-07-30 VITALS — BP 118/80 | HR 68 | Ht 65.0 in | Wt 205.0 lb

## 2019-07-30 DIAGNOSIS — G8929 Other chronic pain: Secondary | ICD-10-CM

## 2019-07-30 DIAGNOSIS — G5691 Unspecified mononeuropathy of right upper limb: Secondary | ICD-10-CM | POA: Diagnosis not present

## 2019-07-30 DIAGNOSIS — M533 Sacrococcygeal disorders, not elsewhere classified: Secondary | ICD-10-CM | POA: Diagnosis not present

## 2019-07-30 DIAGNOSIS — M545 Low back pain, unspecified: Secondary | ICD-10-CM

## 2019-07-30 DIAGNOSIS — G5692 Unspecified mononeuropathy of left upper limb: Secondary | ICD-10-CM | POA: Diagnosis not present

## 2019-07-30 DIAGNOSIS — M2550 Pain in unspecified joint: Secondary | ICD-10-CM

## 2019-07-30 DIAGNOSIS — M542 Cervicalgia: Secondary | ICD-10-CM

## 2019-07-30 DIAGNOSIS — M4722 Other spondylosis with radiculopathy, cervical region: Secondary | ICD-10-CM | POA: Diagnosis not present

## 2019-07-30 NOTE — Progress Notes (Deleted)
   Office Visit Note   Patient: EVALYNN HANKINS           Date of Birth: 13-Mar-1956           MRN: [id number] Visit Date: 07/30/2019              Requested by: Wanda Plump, MD [address] [place],  [place] [address] PCP: Wanda Plump, MD   Assessment & Plan: Visit Diagnoses:  1. Neck pain   2. Chronic midline low back pain, unspecified whether sciatica present     Plan: ***  Follow-Up Instructions: No follow-ups on file.   Orders:  Orders Placed This Encounter  Procedures  . XR Cervical Spine 2 or 3 views  . XR Lumbar Spine 2-3 Views   No orders of the defined types were placed in this encounter.     Procedures: No procedures performed   Clinical Data: No additional findings.   Subjective: Chief Complaint  Patient presents with  . Neck - Pain  . Lower Back - Pain    HPI  Review of Systems   Objective: Vital Signs: BP 118/80   Pulse 68   Ht 5\' 5"  (1.651 m)   Wt 205 lb (93 kg)   BMI 34.11 kg/m   Physical Exam  Ortho Exam  Specialty Comments:  No specialty comments available.  Imaging: No results found.   PMFS History: Patient Active Problem List   Diagnosis Date Noted  . Annual physical exam 11/26/2016  . PCP NOTES >>>>>>>>>>>>> 05/31/2016  . Hypertension 02/11/2014  . Diabetes mellitus without complication ([facility]) 02/11/2014  . Hyperlipidemia 02/11/2014  . 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 Diabetes mellitus without complication ([facility])   . Hyperlipidemia 02/11/2014  . Hypertension   . Neck pain   . 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 Hypertension Mother   . Hypertension Father   . Heart disease Father   . Diabetes Maternal Aunt   . Colon cancer Neg Hx   . Breast cancer Neg Hx   . CAD Neg Hx     Past Surgical History:  Procedure Laterality Date  . ABDOMINAL HYSTERECTOMY     abdominal, no oophorectomy  . BREAST CYST EXCISION    . CYST  REMOVAL LEG  07-2013   inner L thigh, Dr 13/05/2013, [facility]    Social History   Occupational History  . Occupation: CNA -- [employer]   Tobacco Use  . Smoking status: Never Smoker  . Smokeless tobacco: Never Used  Substance and Sexual Activity  . Alcohol use: Yes    Alcohol/week: 0.0 standard drinks    Comment: wine, rare   . Drug use: No  . Sexual activity: Not on file

## 2019-07-30 NOTE — Progress Notes (Signed)
Office Visit Note   Patient: Lisa Meyers           Date of Birth: [birth year]           MRN: [id number] Visit Date: 07/30/2019              Requested by: Colon Branch, [address] [place],  [place] [address] PCP: Colon Branch, MD   Assessment & Plan: Visit Diagnoses:  1. Neck pain   2. Chronic midline low back pain, unspecified whether sciatica present   3. Polyarthralgia   4. Other spondylosis with radiculopathy, cervical region   5. Neuropathy of left upper extremity   6. Neuropathy of right upper extremity   7. Chronic SI joint pain     Plan: Patient has multiple issues going on today.  With her ongoing and worsening neck pain and bilateral upper extremity radiculopathy and severe findings on her cervical x-ray I recommend getting an MRI to rule out HNP/stenosis.  States that she also has been told in the past that she has left carpal tunnel syndrome.  She does have positive Tinel's left.  The right wrist today and I will also plan to get an NCV/EMG study bilateral extremities to rule out cervical radiculopathy versus peripheral entrapment neuropathy.  With patient's polyarthralgia we will also get blood work to check a CBC and arthritis panel.  She will follow-up with Dr. Lorin Mercy in 3 weeks to review everything ordered today.  With patient's history of uncontrolled diabetes I do not want to give her a prednisone taper.  She can continue managing her pain the way that she has been doing until follow-up with Dr. Lorin Mercy.  Follow-Up Instructions: Return in about 3 weeks (around 08/20/2019) for With Dr. Lorin Mercy to review cervical MRI, NCV/EMG study, and blood work.   Orders:  Orders Placed This Encounter  Procedures  . XR Cervical Spine 2 or 3 views  . XR Lumbar Spine 2-3 Views  . MR Cervical Spine w/o contrast  . CBC  . Antinuclear Antib (ANA)  . Rheumatoid Factor  . Uric acid  . Sed Rate (ESR)  . Ambulatory referral to Physical Medicine Rehab   No orders of the  defined types were placed in this encounter.     Procedures: No procedures performed   Clinical Data: No additional findings.   Subjective: Chief Complaint  Patient presents with  . Neck - Pain  . Lower Back - Pain    HPI 64 year old black female who is new patient to the clinic comes in today with multiple complaints.  Comes in complaining of neck pain, bilateral hand numbness and tingling, low back pain, right knee pain/swelling.  Has had neck issues for several years.  [place] she did have a cervical MRI which showed multilevel cervical spondylosis with disc herniations.  She did bring in that old report but not the actual images.  She did not have any treatment for that.  Feels like her neck pain is getting worse.  Localizes pain to the posterior aspect that extends into the bilateral trapezius and scapular area.  She works as a Production assistant, radio in a skilled facility and neck has been progressively worsening while doing her job.  She also admits to having constant bilateral numbness and tingling in both hands.  States that she does have a history of left carpal tunnel syndrome and had a diagnostic study confirming this a few years  ago.  Did not have surgery.  Patient denies any problems with unsteady gait/balance issues.  She also complains of pain in the low back where she localizes more around the SI joints.  Describing any neurogenic claudication symptoms.  Denies any lower extremity radiculopathy.  States that she has aching in several joints.  Says that she has aching in the right knee with swelling.  Has also had some aching in the left knee as well.  No mechanical symptoms or feeling of instability.  She denies history of inflammatory arthropathy but states that she has not had formal work-up for this. Review of Systems No current cardiac pulmonary GI GU issues  Objective: Vital Signs: BP 118/80   Pulse 68   Ht 5' 5" (1.651 m)   Wt 205 lb (93 kg)    BMI 34.11 kg/m   Physical Exam HENT:     Head: Normocephalic and atraumatic.  Pulmonary:     Effort: No respiratory distress.  Musculoskeletal:     Comments: Gait is normal.  Exam of neck patient has bilateral brachial plexus and trapezius tenderness.  Posterior neck also tender to palpation.  Bilateral shoulders good range of motion.  Negative impingement test.  Bilateral exam unremarkable.  Negative Tinel's over the cubital tunnels.  Bilateral wrist good range of motion.  Positive left greater than right Tinel's bilateral wrist.  No focal motor deficits.  Patient has mild to moderate tenderness over the bilateral SI joints.  Negative logroll bilateral hips.  Negative straight leg raise.  Bilateral knees good range of motion.  Patient has right greater than left knee swelling.  No significant effusions.  Mild patellofemoral crepitus on the right.  Bilateral knees joint line tenderness.  Neurological:     General: No focal deficit present.     Mental Status: She is alert and oriented to person, place, and time.  Psychiatric:        Mood and Affect: Mood normal.     Ortho Exam  Specialty Comments:  No specialty comments available.  Imaging: No results found.   PMFS History: Patient Active Problem List   Diagnosis Date Noted  . Annual physical exam 11/26/2016  . PCP NOTES >>>>>>>>>>>>> 05/31/2016  . Hypertension 02/11/2014  . Diabetes mellitus without complication ([place]) 28/20/8138  . Hyperlipidemia 02/11/2014  . 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 Diabetes mellitus without complication ([place])   . Hyperlipidemia 02/11/2014  . Hypertension   . Neck pain   . 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 Hypertension Mother   . Hypertension Father   . Heart disease Father   . Diabetes Maternal Aunt   . Colon cancer Neg Hx   . Breast cancer Neg Hx   . CAD Neg Hx     Past Surgical History:  Procedure  Laterality Date  . ABDOMINAL HYSTERECTOMY     abdominal, no oophorectomy  . BREAST CYST EXCISION    . CYST REMOVAL LEG  07-2013   inner L thigh, Dr Tommi Rumps, [place]   Occupational History  . Occupation: CNA -- [employer]   Tobacco Use  . Smoking status: Never Smoker  . Smokeless tobacco: Never Used  Substance and Sexual Activity  . Alcohol use: Yes    Alcohol/week: 0.0 standard drinks    Comment: wine, rare   . Drug use: No  . Sexual activity: Not on  file

## 2019-07-31 LAB — CBC
HCT: 38.5 % (ref 35.0–45.0)
Hemoglobin: 12.6 g/dL (ref 11.7–15.5)
MCH: 29.6 pg (ref 27.0–33.0)
MCHC: 32.7 g/dL (ref 32.0–36.0)
MCV: 90.4 fL (ref 80.0–100.0)
MPV: 10.7 fL (ref 7.5–12.5)
Platelets: 252 10*3/uL (ref 140–400)
RBC: 4.26 10*6/uL (ref 3.80–5.10)
RDW: 12.9 % (ref 11.0–15.0)
WBC: 3.4 10*3/uL — ABNORMAL LOW (ref 3.8–10.8)

## 2019-07-31 LAB — SEDIMENTATION RATE: Sed Rate: 25 mm/h (ref 0–30)

## 2019-07-31 LAB — ANA: Anti Nuclear Antibody (ANA): NEGATIVE

## 2019-07-31 LAB — URIC ACID: Uric Acid, Serum: 5.5 mg/dL (ref 2.5–7.0)

## 2019-07-31 LAB — RHEUMATOID FACTOR: Rhuematoid fact SerPl-aCnc: <14 IU/mL (ref <14)

## 2019-08-13 ENCOUNTER — Other Ambulatory Visit: Payer: Self-pay

## 2019-08-13 MED ORDER — HYDROCHLOROTHIAZIDE 25 MG PO TABS: 25.0000 mg | ORAL_TABLET | Freq: Every day | ORAL | 4 refills | Status: DC

## 2019-08-16 ENCOUNTER — Telehealth: Payer: Self-pay | Admitting: Orthopaedic Surgery

## 2019-08-16 NOTE — Telephone Encounter (Signed)
Could you please speak with patient? This is the referral you sent back to me this morning as FYI that patient had declined study.  Please let me know if you need anything further.

## 2019-08-16 NOTE — Telephone Encounter (Signed)
Patient stopped by.   She wanted to know if she could be set up for a nerve conduction study   Call back: [phone]

## 2019-08-17 NOTE — Telephone Encounter (Signed)
Called pt and lvm #1 

## 2019-08-20 ENCOUNTER — Other Ambulatory Visit: Payer: Self-pay | Admitting: Internal Medicine

## 2019-08-21 DIAGNOSIS — R69 Illness, unspecified: Secondary | ICD-10-CM | POA: Diagnosis not present

## 2019-08-23 ENCOUNTER — Other Ambulatory Visit: Payer: Self-pay

## 2019-08-23 MED ORDER — METOPROLOL SUCCINATE ER 25 MG PO TB24: 25.0000 mg | ORAL_TABLET | Freq: Every day | ORAL | 1 refills | Status: DC

## 2019-08-23 MED ORDER — METFORMIN HCL 1000 MG PO TABS: 1000.0000 mg | ORAL_TABLET | Freq: Two times a day (BID) | ORAL | 1 refills | Status: DC

## 2019-08-28 ENCOUNTER — Encounter: Payer: Self-pay | Admitting: Orthopaedic Surgery

## 2019-08-28 ENCOUNTER — Other Ambulatory Visit: Payer: Self-pay

## 2019-08-28 ENCOUNTER — Ambulatory Visit (INDEPENDENT_AMBULATORY_CARE_PROVIDER_SITE_OTHER): Payer: Medicare HMO | Admitting: Orthopaedic Surgery

## 2019-08-28 VITALS — BP 113/85 | HR 76 | Ht 65.0 in | Wt 205.0 lb

## 2019-08-28 DIAGNOSIS — Z5321 Procedure and treatment not carried out due to patient leaving prior to being seen by health care provider: Secondary | ICD-10-CM

## 2019-09-13 ENCOUNTER — Other Ambulatory Visit: Payer: Self-pay | Admitting: Internal Medicine

## 2019-10-23 ENCOUNTER — Encounter: Payer: Self-pay | Admitting: Internal Medicine

## 2019-11-07 ENCOUNTER — Encounter: Payer: Self-pay | Admitting: Internal Medicine

## 2019-11-07 ENCOUNTER — Other Ambulatory Visit: Payer: Self-pay

## 2019-11-07 ENCOUNTER — Ambulatory Visit (INDEPENDENT_AMBULATORY_CARE_PROVIDER_SITE_OTHER): Payer: Medicare HMO | Admitting: Internal Medicine

## 2019-11-07 VITALS — BP 120/83 | HR 69 | Temp 98.4°F | Resp 16 | Ht 65.0 in | Wt 201.0 lb

## 2019-11-07 DIAGNOSIS — E119 Type 2 diabetes mellitus without complications: Secondary | ICD-10-CM | POA: Diagnosis not present

## 2019-11-07 DIAGNOSIS — M542 Cervicalgia: Secondary | ICD-10-CM | POA: Diagnosis not present

## 2019-11-07 DIAGNOSIS — E785 Hyperlipidemia, unspecified: Secondary | ICD-10-CM | POA: Diagnosis not present

## 2019-11-07 DIAGNOSIS — G8929 Other chronic pain: Secondary | ICD-10-CM

## 2019-11-07 DIAGNOSIS — I10 Essential (primary) hypertension: Secondary | ICD-10-CM | POA: Diagnosis not present

## 2019-11-07 LAB — LIPID PANEL
Cholesterol: 177 mg/dL (ref 0–200)
HDL: 64.6 mg/dL (ref >39.00)
LDL Cholesterol: 102 mg/dL — ABNORMAL HIGH (ref 0–99)
NonHDL: 112.4
Total CHOL/HDL Ratio: 3
Triglycerides: 53 mg/dL (ref 0.0–149.0)
VLDL: 10.6 mg/dL (ref 0.0–40.0)

## 2019-11-07 LAB — HEMOGLOBIN A1C: Hgb A1c MFr Bld: 6.8 % — ABNORMAL HIGH (ref 4.6–6.5)

## 2019-11-07 LAB — BASIC METABOLIC PANEL
BUN: 20 mg/dL (ref 6–23)
CO2: 29 mEq/L (ref 19–32)
Calcium: 9.8 mg/dL (ref 8.4–10.5)
Chloride: 106 mEq/L (ref 96–112)
Creatinine, Ser: 0.84 mg/dL (ref 0.40–1.20)
GFR: 81.95 mL/min (ref >60.00)
Glucose, Bld: 144 mg/dL — ABNORMAL HIGH (ref 70–99)
Potassium: 3.5 mEq/L (ref 3.5–5.1)
Sodium: 140 mEq/L (ref 135–145)

## 2019-11-07 NOTE — Progress Notes (Signed)
Pre visit review using our clinic review tool, if applicable. No additional management support is needed unless otherwise documented below in the visit note. 

## 2019-11-07 NOTE — Patient Instructions (Addendum)
Please schedule Medicare Wellness with Lawanna Kobus.   Per our records you are due for an eye exam. Please contact your eye doctor to schedule an appointment. Please have them send copies of your office visit notes to [place]. Our fax number is [phone].  Diabetes: Check your blood sugar  once a day at different times of the day GOALS: Fasting before a meal 70- 130 2 hours after a meal less than 180  Continue checking your blood pressures BP GOAL is between 110/65 and  135/85. If it is consistently higher or lower, let me know      GO TO THE LAB : Get the blood work     GO TO THE FRONT DESK, PLEASE SCHEDULE YOUR APPOINTMENTS Come back for for a checkup in 4 months

## 2019-11-07 NOTE — Progress Notes (Signed)
Subjective:    Patient ID: Lisa Meyers, female    DOB: October 01, 1955, 64 y.o.   MRN: [id number]  DOS:  11/07/2019 Type of visit - description: Follow-up Chronic neck and back pain: Saw Ortho, note reviewed. Today we also talked about diabetes, high blood pressure, high cholesterol. She also noticed from time to time discomfort @ aspect of the left calf.  Not associated with swelling.  Typically decrease with aspirin or resolved spontaneously quickly.  She continue walking almost daily and has lost some more weight.  Wt Readings from Last 3 Encounters:  11/07/19 201 lb (91.2 kg)  08/28/19 205 lb (93 kg)  07/30/19 205 lb (93 kg)    Review of Systems See above   Past Medical History:  Diagnosis Date  . Diabetes mellitus without complication ([place])   . Hyperlipidemia 02/11/2014  . Hypertension   . Neck pain   . 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 ABDOMINAL HYSTERECTOMY     abdominal, no oophorectomy  . BREAST CYST EXCISION    . CYST REMOVAL LEG  07-2013   inner L thigh, Dr Tommi Rumps, [place] as of 11/07/2019   No Known Allergies     Medication List       Accurate as of November 07, 2019 11:10 AM. If you have any questions, ask your nurse or doctor.        blood glucose meter kit and supplies Dispense based on patient and insurance preference. Check blood sugar once daily.   DAILY MULTIVITAMIN PO Take by mouth.   hydrochlorothiazide 25 MG tablet Commonly known as: HYDRODIURIL Take 1 tablet (25 mg total) by mouth daily.   lisinopril 40 MG tablet Commonly known as: ZESTRIL TAKE 1 TABLET BY MOUTH DAILY   lovastatin 20 MG tablet Commonly known as: MEVACOR Take 1 tablet (20 mg total) by mouth at bedtime.   metFORMIN 1000 MG tablet Commonly known as: GLUCOPHAGE Take 1 tablet (1,000 mg total) by mouth 2 (two) times daily with a meal.   metoprolol succinate 25 MG 24 hr tablet Commonly known as:  TOPROL-XL Take 1 tablet (25 mg total) by mouth daily.   OneTouch Ultra test strip Generic drug: glucose blood Check blood sugars once daily   vitamin C 100 MG tablet Take 100 mg by mouth daily.   VITAMIN D (ERGOCALCIFEROL) PO Take by mouth.          Objective:   Physical Exam BP 120/83 (BP Location: Left Arm, Patient Position: Sitting, Cuff Size: Normal)   Pulse 69   Temp 98.4 F (36.9 C) (Oral)   Resp 16   Ht '5\' 5"'$  (1.651 m)   Wt 201 lb (91.2 kg)   SpO2 99%   BMI 33.45 kg/m  General:   Well developed, NAD, BMI noted. HEENT:  Normocephalic . Face symmetric, atraumatic Lungs:  CTA B Normal respiratory effort, no intercostal retractions, no accessory muscle use. Heart: RRR,  no murmur.  Lower extremities: no pretibial edema bilaterally Calves are symmetric and no TTP. Area of pain, at the lateral aspect of the left calf is normal to inspection and palpation. Skin: Not pale. Not jaundice Neurologic:  alert & oriented X3.  Speech normal, gait appropriate for age and unassisted Psych--  Cognition and judgment appear intact.  Cooperative with normal attention span and concentration.  Behavior appropriate. No anxious or depressed appearing.      Assessment  Assessment DM HTN. Amlodipine d/c 2019 d/t swelling  Hyperlipidemia + PPD 1990s + PPD 09/03/2015, 16 mm, x-ray negative, completed 3 months of INH 12/11/2015 at [place]: DM: Last A1c 7.0, declined to [place], she continue with Metformin, she continue to lose weight, reports taking a walk daily.  Praised!.  Ambulatory CBGs typically in the low 100s, never more than 180.  Check A1c, at this time if she is not at goal she will reconsider taking Jardiance. HTN: Reports normal ambulatory BPs, check BMP, continue lisinopril, HCTZ, metoprolol. High cholesterol: Last FLP not at goal, at the time was not taking lovastatin, now she is taking it daily.  Checking a FLP. Neck neck and back  pain: Saw Ortho 07/30/2019, they recommended NCV/EMG for CTS evaluation. Due to polyarthralgia, a number of labs were ordered (negative ANA, RF, sed rate).  MRI of the cervical exam was ordered Preventive care: Had Covid vaccinations RTC 4 months   This visit occurred during the SARS-CoV-2 public health emergency.  Safety protocols were in place, including screening questions prior to the visit, additional usage of staff PPE, and extensive cleaning of exam room while observing appropriate contact time as indicated for disinfecting solutions.

## 2019-11-08 NOTE — Assessment & Plan Note (Signed)
DM: Last A1c 7.0, declined to [place], she continue with Metformin, she continue to lose weight, reports taking a walk daily.  Praised!.  Ambulatory CBGs typically in the low 100s, never more than 180.  Check A1c, at this time if she is not at goal she will reconsider taking Jardiance. HTN: Reports normal ambulatory BPs, check BMP, continue lisinopril, HCTZ, metoprolol. High cholesterol: Last FLP not at goal, at the time was not taking lovastatin, now she is taking it daily.  Checking a FLP. Neck neck and back pain: Saw Ortho 07/30/2019, they recommended NCV/EMG for CTS evaluation. Due to polyarthralgia, a number of labs were ordered (negative ANA, RF, sed rate).  MRI of the cervical exam was ordered Preventive care: Had Covid vaccinations RTC 4 months

## 2019-11-19 DIAGNOSIS — R69 Illness, unspecified: Secondary | ICD-10-CM | POA: Diagnosis not present

## 2019-12-07 ENCOUNTER — Other Ambulatory Visit: Payer: Self-pay | Admitting: Internal Medicine

## 2019-12-07 MED ORDER — LOVASTATIN 20 MG PO TABS: 20.0000 mg | ORAL_TABLET | Freq: Every day | ORAL | 1 refills | Status: DC

## 2019-12-19 ENCOUNTER — Encounter (HOSPITAL_COMMUNITY): Payer: Self-pay

## 2019-12-19 ENCOUNTER — Ambulatory Visit (HOSPITAL_COMMUNITY)
Admission: EM | Admit: 2019-12-19 | Discharge: 2019-12-19 | Discharge status: Against Medical Advice | Payer: Medicare HMO

## 2019-12-19 ENCOUNTER — Other Ambulatory Visit: Payer: Self-pay

## 2019-12-19 ENCOUNTER — Ambulatory Visit (HOSPITAL_COMMUNITY)
Admission: EM | Admit: 2019-12-19 | Discharge: 2019-12-19 | Disposition: A | Discharge status: Home / Self Care | Payer: Medicare HMO | Attending: Family Medicine | Admitting: Family Medicine

## 2019-12-19 DIAGNOSIS — H6690 Otitis media, unspecified, unspecified ear: Secondary | ICD-10-CM | POA: Insufficient documentation

## 2019-12-19 DIAGNOSIS — E785 Hyperlipidemia, unspecified: Secondary | ICD-10-CM | POA: Insufficient documentation

## 2019-12-19 DIAGNOSIS — Z833 Family history of diabetes mellitus: Secondary | ICD-10-CM | POA: Diagnosis not present

## 2019-12-19 DIAGNOSIS — Z8249 Family history of ischemic heart disease and other diseases of the circulatory system: Secondary | ICD-10-CM | POA: Diagnosis not present

## 2019-12-19 DIAGNOSIS — Z7984 Long term (current) use of oral hypoglycemic drugs: Secondary | ICD-10-CM | POA: Insufficient documentation

## 2019-12-19 DIAGNOSIS — Z791 Long term (current) use of non-steroidal anti-inflammatories (NSAID): Secondary | ICD-10-CM | POA: Diagnosis not present

## 2019-12-19 DIAGNOSIS — I10 Essential (primary) hypertension: Secondary | ICD-10-CM | POA: Diagnosis not present

## 2019-12-19 DIAGNOSIS — E119 Type 2 diabetes mellitus without complications: Secondary | ICD-10-CM | POA: Insufficient documentation

## 2019-12-19 DIAGNOSIS — Z79899 Other long term (current) drug therapy: Secondary | ICD-10-CM | POA: Diagnosis not present

## 2019-12-19 DIAGNOSIS — Z20822 Contact with and (suspected) exposure to covid-19: Secondary | ICD-10-CM | POA: Insufficient documentation

## 2019-12-19 MED ORDER — AMOXICILLIN 500 MG PO CAPS: 1000.0000 mg | ORAL_CAPSULE | Freq: Three times a day (TID) | ORAL | 0 refills | Status: AC

## 2019-12-19 MED ORDER — GUAIFENESIN 100 MG/5ML PO LIQD: 100.0000 mg | ORAL | 0 refills | Status: DC | PRN

## 2019-12-19 MED ORDER — IBUPROFEN 600 MG PO TABS: 600.0000 mg | ORAL_TABLET | Freq: Three times a day (TID) | ORAL | 0 refills | Status: DC | PRN

## 2019-12-19 NOTE — ED Provider Notes (Signed)
[facility]    CSN: [id number] Arrival date & time: 12/19/19  1302      History   Chief Complaint Chief Complaint  Patient presents with  . Sore Throat  . Otalgia    HPI Lisa Meyers is a 64 y.o. female.   Patient is a 64 year old female who presents today with sore throat, loss of voice and bilateral ear pain x2 days.  Symptoms have been constant.  Pain worse in the left ear.  Mild cough.  Denies any fever, chills, body aches.  Patient does work in a nursing facility.  Has been fully vaccinated.     Past Medical History:  Diagnosis Date  . Diabetes mellitus without complication ([place])   . Hyperlipidemia 02/11/2014  . Hypertension   . Neck pain   . 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 Patient left without being seen 11/26/2016  . PCP NOTES >>>>>>>>>>>>> 05/31/2016  . Hypertension 02/11/2014  . Diabetes mellitus without complication ([place]) 40/98/1191  . Hyperlipidemia 02/11/2014  . PPD positive 02/11/2014    Past Surgical History:  Procedure Laterality Date  . ABDOMINAL HYSTERECTOMY     abdominal, no oophorectomy  . BREAST CYST EXCISION    . CYST REMOVAL LEG  07-2013   inner L thigh, Dr Tommi Rumps, [place]     OB History    Gravida  1   Para  1   Term      Preterm      AB      Living  1     SAB      TAB      Ectopic      Multiple      Live Births  1            Home Medications    Prior to Admission medications   Medication Sig Start Date End Date Taking? Authorizing Provider  amoxicillin (AMOXIL) 500 MG capsule Take 2 capsules (1,000 mg total) by mouth 3 (three) times daily for 7 days. 12/19/19 12/26/19  Loura Halt A, NP  Ascorbic Acid (VITAMIN C) 100 MG tablet Take 100 mg by mouth daily.    [provider]  blood glucose meter kit and supplies Dispense based on patient and insurance preference. Check blood sugar once daily. 06/06/19   Colon Branch, MD  glucose  blood [facility] ULTRA) test strip Check blood sugars once daily 08/21/19   Colon Branch, MD  guaiFENesin (ROBITUSSIN) 100 MG/5ML liquid Take 5-10 mLs (100-200 mg total) by mouth every 4 (four) hours as needed for cough. 12/19/19   Loura Halt A, NP  hydrochlorothiazide (HYDRODIURIL) 25 MG tablet Take 1 tablet (25 mg total) by mouth daily. 08/13/19   Colon Branch, MD  ibuprofen (ADVIL) 600 MG tablet Take 1 tablet (600 mg total) by mouth every 8 (eight) hours as needed for moderate pain. 12/19/19   Loura Halt A, NP  lisinopril (ZESTRIL) 40 MG tablet Take 1 tablet (40 mg total) by mouth daily. 12/07/19   Colon Branch, MD  lovastatin (MEVACOR) 20 MG tablet Take 1 tablet (20 mg total) by mouth at bedtime. 12/07/19   Colon Branch, MD  metFORMIN (GLUCOPHAGE) 1000 MG tablet Take 1 tablet (1,000 mg total) by mouth 2 (two) times daily with a meal. 08/23/19   Colon Branch, MD  metoprolol succinate (TOPROL-XL) 25 MG 24 hr tablet Take 1 tablet (  25 mg total) by mouth daily. 08/23/19   Colon Branch, MD  Multiple Vitamins-Minerals (DAILY MULTIVITAMIN PO) Take by mouth.    [provider]  VITAMIN D, ERGOCALCIFEROL, PO Take by mouth.    [provider]    Family History Family History  Problem Relation Age of Onset  . Hypertension Mother   . Hypertension Father   . Heart disease Father   . Diabetes Maternal Aunt   . Colon cancer Neg Hx   . Breast cancer Neg Hx   . CAD Neg Hx     Social History Social History   Tobacco Use  . Smoking status: Never Smoker  . Smokeless tobacco: Never Used  Substance Use Topics  . Alcohol use: Yes    Alcohol/week: 0.0 standard drinks    Comment: wine, rare   . Drug use: No     Allergies   Patient has no known allergies.   Review of Systems Review of Systems   Physical Exam Triage Vital Signs ED Triage Vitals  Enc Vitals Group     BP 12/19/19 1500 104/67     Pulse Rate 12/19/19 1500 95     Resp 12/19/19 1500 17     Temp 12/19/19 1500 98.8 F (37.1  C)     Temp Source 12/19/19 1500 Oral     SpO2 12/19/19 1500 99 %     Weight --      Height --      Head Circumference --      Peak Flow --      Pain Score 12/19/19 1459 6     Pain Loc --      Pain Edu? --      Excl. in [place]? --    No data found.  Updated Vital Signs BP 104/67 (BP Location: Right Arm)   Pulse 95   Temp 98.8 F (37.1 C) (Oral)   Resp 17   SpO2 99%   Visual Acuity Right Eye Distance:   Left Eye Distance:   Bilateral Distance:    Right Eye Near:   Left Eye Near:    Bilateral Near:     Physical Exam Vitals and nursing note reviewed.  Constitutional:      General: She is not in acute distress.    Appearance: Normal appearance. She is not ill-appearing, toxic-appearing or diaphoretic.  HENT:     Head: Normocephalic.     Right Ear: Tympanic membrane and ear canal normal.     Left Ear: Tympanic membrane is erythematous and retracted.     Nose: Nose normal.     Mouth/Throat:     Pharynx: Posterior oropharyngeal erythema present.  Eyes:     Conjunctiva/sclera: Conjunctivae normal.  Cardiovascular:     Rate and Rhythm: Normal rate and regular rhythm.  Pulmonary:     Effort: Pulmonary effort is normal.     Breath sounds: Normal breath sounds.  Musculoskeletal:        General: Normal range of motion.     Cervical back: Normal range of motion.  Skin:    General: Skin is warm and dry.     Findings: No rash.  Neurological:     Mental Status: She is alert.  Psychiatric:        Mood and Affect: Mood normal.      UC Treatments / Results  Labs (all labs ordered are listed, but only abnormal results are displayed) Labs Reviewed  SARS CORONAVIRUS 2 (TAT 6-24 HRS)  EKG   Radiology No results found.  Procedures Procedures (including critical care time)  Medications Ordered in UC Medications - No data to display  Initial Impression / Assessment and Plan / UC Course  I have reviewed the triage vital signs and the nursing notes.  Pertinent  labs & imaging results that were available during my care of the patient were reviewed by me and considered in my medical decision making (see chart for details).     Acute otitis media Treating  with amoxicillin Ibuprofen for pain as needed  Viral URI covid swab pending.  Robitussin for cough Final Clinical Impressions(s) / UC Diagnoses   Final diagnoses:  Acute otitis media, unspecified otitis media type     Discharge Instructions     Treating you for an ear infection Amoxicillin 3 times a day for 7 days Ibuprofen every 8 hours for pain as needed Guaifenesin for cough You can check your MyChart for Covid results.     ED Prescriptions    Medication Sig Dispense Auth. Provider   amoxicillin (AMOXIL) 500 MG capsule Take 2 capsules (1,000 mg total) by mouth 3 (three) times daily for 7 days. 40 capsule Parissa Chiao A, NP   ibuprofen (ADVIL) 600 MG tablet Take 1 tablet (600 mg total) by mouth every 8 (eight) hours as needed for moderate pain. 30 tablet Chistine Dematteo A, NP   guaiFENesin (ROBITUSSIN) 100 MG/5ML liquid Take 5-10 mLs (100-200 mg total) by mouth every 4 (four) hours as needed for cough. 60 mL Kerstin Crusoe A, NP     PDMP not reviewed this encounter.   Orvan July, NP 12/19/19 [phone]

## 2019-12-19 NOTE — ED Triage Notes (Signed)
Pt presents with sore throat, loss of voice, and bilateral ear pain X 2 days.

## 2019-12-19 NOTE — Discharge Instructions (Addendum)
Treating you for an ear infection Amoxicillin 3 times a day for 7 days Ibuprofen every 8 hours for pain as needed Guaifenesin for cough You can check your MyChart for Covid results.

## 2019-12-20 LAB — SARS CORONAVIRUS 2 (TAT 6-24 HRS): SARS Coronavirus 2: NEGATIVE

## 2020-02-07 DIAGNOSIS — Z23 Encounter for immunization: Secondary | ICD-10-CM | POA: Diagnosis not present

## 2020-02-22 ENCOUNTER — Encounter: Payer: Self-pay | Admitting: Internal Medicine

## 2020-02-23 ENCOUNTER — Other Ambulatory Visit: Payer: Self-pay | Admitting: Internal Medicine

## 2020-02-25 DIAGNOSIS — R69 Illness, unspecified: Secondary | ICD-10-CM | POA: Diagnosis not present

## 2020-03-09 ENCOUNTER — Other Ambulatory Visit: Payer: Self-pay | Admitting: Internal Medicine

## 2020-03-14 ENCOUNTER — Ambulatory Visit (INDEPENDENT_AMBULATORY_CARE_PROVIDER_SITE_OTHER): Payer: Medicare HMO | Admitting: Internal Medicine

## 2020-03-14 ENCOUNTER — Encounter: Payer: Self-pay | Admitting: Internal Medicine

## 2020-03-14 ENCOUNTER — Other Ambulatory Visit: Payer: Self-pay

## 2020-03-14 VITALS — BP 112/79 | HR 71 | Resp 17 | Ht 67.0 in | Wt 200.0 lb

## 2020-03-14 DIAGNOSIS — E119 Type 2 diabetes mellitus without complications: Secondary | ICD-10-CM

## 2020-03-14 DIAGNOSIS — M542 Cervicalgia: Secondary | ICD-10-CM

## 2020-03-14 DIAGNOSIS — G8929 Other chronic pain: Secondary | ICD-10-CM

## 2020-03-14 DIAGNOSIS — I10 Essential (primary) hypertension: Secondary | ICD-10-CM | POA: Diagnosis not present

## 2020-03-14 NOTE — Progress Notes (Signed)
Subjective:    Patient ID: Lisa Meyers, female    DOB: [birth year], 64 y.o.   MRN: [id number]  DOS:  03/14/2020 Type of visit - description: Follow-up Since the last office visit, has essentially no new complaints. Continue with "swelling" at the legs and knees. Continue with pain at the neck, knees, back ; pain is on and off. Also reports that when she takes metoprolol she cannot tolerate it, her ears feel plugged.  I asked specifically about allergic symptoms such as itching and she could not tell me.  Review of Systems See above   Past Medical History:  Diagnosis Date  . Diabetes mellitus without complication ([place])   . Hyperlipidemia 02/11/2014  . Hypertension   . Neck pain   . 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 ABDOMINAL HYSTERECTOMY     abdominal, no oophorectomy  . BREAST CYST EXCISION    . CYST REMOVAL LEG  07-2013   inner L thigh, Dr Tommi Rumps, [place] as of 03/14/2020   No Known Allergies     Medication List       Accurate as of March 14, 2020 11:59 PM. If you have any questions, ask your nurse or doctor.        STOP taking these medications   guaiFENesin 100 MG/5ML liquid Commonly known as: ROBITUSSIN Stopped by: Kathlene November, MD   ibuprofen 600 MG tablet Commonly known as: ADVIL Stopped by: Kathlene November, MD   metoprolol succinate 25 MG 24 hr tablet Commonly known as: TOPROL-XL Stopped by: Kathlene November, MD     TAKE these medications   blood glucose meter kit and supplies Dispense based on patient and insurance preference. Check blood sugar once daily.   DAILY MULTIVITAMIN PO Take by mouth.   hydrochlorothiazide 25 MG tablet Commonly known as: HYDRODIURIL TAKE 1 TABLET BY MOUTH EVERY DAY   lisinopril 40 MG tablet Commonly known as: ZESTRIL Take 1 tablet (40 mg total) by mouth daily.   lovastatin 20 MG tablet Commonly known as: MEVACOR Take 1 tablet (20 mg total) by mouth at bedtime.    metFORMIN 1000 MG tablet Commonly known as: GLUCOPHAGE Take 1 tablet (1,000 mg total) by mouth 2 (two) times daily with a meal.   OneTouch Ultra test strip Generic drug: glucose blood Check blood sugars once daily   vitamin C 100 MG tablet Take 100 mg by mouth daily.   VITAMIN D (ERGOCALCIFEROL) PO Take by mouth.          Objective:   Physical Exam BP 112/79 (BP Location: Right Arm, Patient Position: Sitting, Cuff Size: Large)   Pulse 71   Resp 17   Ht _0  (1.702 m)   Wt 200 lb (90.7 kg)   SpO2 98%   BMI 31.32 kg/m  General:   Well developed, NAD, BMI noted. HEENT:  Normocephalic . Face symmetric, atraumatic Lungs:  CTA B Normal respiratory effort, no intercostal retractions, no accessory muscle use. Heart: RRR,  no murmur.  Lower extremities: no pretibial edema bilaterally. Knees: No edema or effusion.  + Fatty tissue all around. Skin: Not pale. Not jaundice Neurologic:  alert & oriented X3.  Speech normal, gait appropriate for age and unassisted Psych--  Cognition and judgment appear intact.  Cooperative with normal attention span and concentration.  Behavior appropriate. No anxious or depressed appearing.      Assessment    Assessment  DM HTN. Amlodipine d/c 2019 d/t swelling  Hyperlipidemia + PPD 1990s + PPD 09/03/2015, 16 mm, x-ray negative, completed 3 months of INH 12/11/2015 at [place] Chronic neck, back pain (x years).  PLAN: DM: On Metformin, check A1c. Hypertension: BP today is very good, reports intolerance to metoprolol due to "ear feeling plugged".  Rec to DC metoprolol,  Continue Lisinopril, HCTZ.  Monitor BPs.  If not at goal, add carvedilol? Neck, back pain: She did not complete work-up as requested by Ortho specifically no neck MRI and she does not want to go back to them. States that he is having these pains since she lived in [place], she works as a Quarry manager, does heavy lifting daily. Pain is well controlled with  ibuprofen, a single dose "takes care the pain away for 1 or 2 days" Recommend to take ibuprofen OTC 2 or 3 tablets once daily as needed with gi precautions, see AVS. "Swelling": Again continue reporting swelling, no evidence of such, she does have a large adipose tissue, I advised her about that and she verbalized understanding. RTC CPX 3 months    This visit occurred during the SARS-CoV-2 public health emergency.  Safety protocols were in place, including screening questions prior to the visit, additional usage of staff PPE, and extensive cleaning of exam room while observing appropriate contact time as indicated for disinfecting solutions.

## 2020-03-14 NOTE — Patient Instructions (Addendum)
Stop metoprolol  Check the  blood pressure weekly. BP GOAL is between 110/65 and  135/85. If it is consistently higher or lower, let me know  IBUPROFEN (Advil or Motrin) 200 mg 2 or 3  tablets daily as needed for pain. Always take it with food because may cause gastritis and ulcers.  If you notice nausea, stomach pain, change in the color of stools --->  Stop the medicine and let us know   GO TO THE LAB : Get the blood work     GO TO THE FRONT DESK, PLEASE SCHEDULE YOUR APPOINTMENTS Come back for a physical exam in 3 months

## 2020-03-15 LAB — HEMOGLOBIN A1C
Hgb A1c MFr Bld: 6.5 % of total Hgb — ABNORMAL HIGH (ref <5.7)
Mean Plasma Glucose: 140 (calc)
eAG (mmol/L): 7.7 (calc)

## 2020-03-15 LAB — BASIC METABOLIC PANEL
BUN: 23 mg/dL (ref 7–25)
CO2: 30 mmol/L (ref 20–32)
Calcium: 9.9 mg/dL (ref 8.6–10.4)
Chloride: 104 mmol/L (ref 98–110)
Creat: 0.91 mg/dL (ref 0.50–0.99)
Glucose, Bld: 119 mg/dL — ABNORMAL HIGH (ref 65–99)
Potassium: 3.8 mmol/L (ref 3.5–5.3)
Sodium: 141 mmol/L (ref 135–146)

## 2020-03-16 NOTE — Assessment & Plan Note (Signed)
DM: On Metformin, check A1c. Hypertension: BP today is very good, reports intolerance to metoprolol due to "ear feeling plugged".  Rec to DC metoprolol,  Continue Lisinopril, HCTZ.  Monitor BPs.  If not at goal, add carvedilol? Neck, back pain: She did not complete work-up as requested by Ortho specifically no neck MRI and she does not want to go back to them. States that he is having these pains since she lived in [place], she works as a Lawyer, does heavy lifting daily. Pain is well controlled with ibuprofen, a single dose "takes care the pain away for 1 or 2 days" Recommend to take ibuprofen OTC 2 or 3 tablets once daily as needed with gi precautions, see AVS. "Swelling": Again continue reporting swelling, no evidence of such, she does have a large adipose tissue, I advised her about that and she verbalized understanding

## 2020-04-11 ENCOUNTER — Encounter (HOSPITAL_COMMUNITY): Payer: Self-pay | Admitting: Emergency Medicine

## 2020-04-11 ENCOUNTER — Ambulatory Visit (HOSPITAL_COMMUNITY)
Admission: EM | Admit: 2020-04-11 | Discharge: 2020-04-11 | Disposition: A | Discharge status: Home / Self Care | Payer: Medicare HMO | Attending: Student | Admitting: Student

## 2020-04-11 DIAGNOSIS — B372 Candidiasis of skin and nail: Secondary | ICD-10-CM

## 2020-04-11 MED ORDER — NYSTATIN 100000 UNIT/GM EX CREA: TOPICAL_CREAM | CUTANEOUS | 1 refills | Status: DC

## 2020-04-11 NOTE — Discharge Instructions (Signed)
Apply nystatin cream to skin 2x daily for 2 weeks. Keep the area as dry as possible. You can try applying baby powder to keep the area dry, and avoid wearing bra while at home.

## 2020-04-11 NOTE — ED Triage Notes (Signed)
Pt presents with rash under left breast and on stomach xs 3-4 days ago. States has been using OTC cream but no relief.

## 2020-04-11 NOTE — ED Notes (Signed)
Patient checked in then walked out into parking lot.

## 2020-04-11 NOTE — ED Provider Notes (Signed)
[facility]    CSN: [id number] Arrival date & time: 04/11/20  1240      History   Chief Complaint Chief Complaint  Patient presents with  . Rash    HPI Lisa Meyers is a 64 y.o. female presenting for rash. History of hyperlipidemia, hypertension, diabetes. Presenting today with rash for 3 days. States rash is under her breasts and is very itchy. Thinks its a yeast infection. Has been trying OTC clomitrazole cream with minimal improvement in symptoms. feelign well otherwise, denies pain, headaches, other rashes, etc.   HPI  Past Medical History:  Diagnosis Date  . Diabetes mellitus without complication (HCC)   . Hyperlipidemia 02/11/2014  . Hypertension   . Neck pain   . 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 Patient left without being seen 11/26/2016  . PCP NOTES >>>>>>>>>>>>> 05/31/2016  . Hypertension 02/11/2014  . Diabetes mellitus without complication (HCC) 02/11/2014  . Hyperlipidemia 02/11/2014  . PPD positive 02/11/2014    Past Surgical History:  Procedure Laterality Date  . ABDOMINAL HYSTERECTOMY     abdominal, no oophorectomy  . BREAST CYST EXCISION    . CYST REMOVAL LEG  07-2013   inner L thigh, Dr Weston, [place]     OB History    Gravida  1   Para  1   Term      Preterm      AB      Living  1     SAB      IAB      Ectopic      Multiple      Live Births  1            Home Medications    Prior to Admission medications   Medication Sig Start Date End Date Taking? Authorizing Provider  nystatin cream (MYCOSTATIN) Apply to affected area 2 times daily for 2 weeks. 04/11/20  Yes Graham, Laura E, PA-C  Ascorbic Acid (VITAMIN C) 100 MG tablet Take 100 mg by mouth daily.    [provider]  blood glucose meter kit and supplies Dispense based on patient and insurance preference. Check blood sugar once daily. 06/06/19   Paz, Jose E, MD  glucose blood (ONETOUCH ULTRA)  test strip Check blood sugars once daily 08/21/19   Paz, Jose E, MD  hydrochlorothiazide (HYDRODIURIL) 25 MG tablet TAKE 1 TABLET BY MOUTH EVERY DAY 03/10/20   Paz, Jose E, MD  lisinopril (ZESTRIL) 40 MG tablet Take 1 tablet (40 mg total) by mouth daily. 12/07/19   Paz, Jose E, MD  lovastatin (MEVACOR) 20 MG tablet Take 1 tablet (20 mg total) by mouth at bedtime. 12/07/19   Paz, Jose E, MD  metFORMIN (GLUCOPHAGE) 1000 MG tablet Take 1 tablet (1,000 mg total) by mouth 2 (two) times daily with a meal. 02/25/20   Paz, Jose E, MD  Multiple Vitamins-Minerals (DAILY MULTIVITAMIN PO) Take by mouth.    [provider]  VITAMIN D, ERGOCALCIFEROL, PO Take by mouth.    [provider]    Family History Family History  Problem Relation Age of Onset  . Hypertension Mother   . Hypertension Father   . Heart disease Father   . Diabetes Maternal Aunt   . Colon cancer Neg Hx   . Breast cancer Neg Hx   . CAD Neg Hx     Social History Social History     Tobacco Use  . Smoking status: Never Smoker  . Smokeless tobacco: Never Used  Substance Use Topics  . Alcohol use: Yes    Alcohol/week: 0.0 standard drinks    Comment: wine, rare   . Drug use: No     Allergies   Patient has no known allergies.   Review of Systems Review of Systems  Skin: Positive for rash.  All other systems reviewed and are negative.    Physical Exam Triage Vital Signs ED Triage Vitals  Enc Vitals Group     BP 04/11/20 1321 (!) 143/90     Pulse Rate 04/11/20 1321 83     Resp 04/11/20 1321 16     Temp 04/11/20 1321 97.8 F (36.6 C)     Temp Source 04/11/20 1321 Oral     SpO2 04/11/20 1321 100 %     Weight --      Height --      Head Circumference --      Peak Flow --      Pain Score 04/11/20 1318 0     Pain Loc --      Pain Edu? --      Excl. in GC? --    No data found.  Updated Vital Signs BP (!) 143/90 (BP Location: Right Arm)   Pulse 83   Temp 97.8 F (36.6 C) (Oral)   Resp 16    SpO2 100%   Visual Acuity Right Eye Distance:   Left Eye Distance:   Bilateral Distance:    Right Eye Near:   Left Eye Near:    Bilateral Near:     Physical Exam Vitals reviewed.  Constitutional:      Appearance: Normal appearance.  Skin:    Comments: Erythematous shiny rash below bilateral breasts. Few scattered patches on abd.   Neurological:     General: No focal deficit present.     Mental Status: She is alert and oriented to person, place, and time.  Psychiatric:        Mood and Affect: Mood normal.        Behavior: Behavior normal.        Thought Content: Thought content normal.        Judgment: Judgment normal.      UC Treatments / Results  Labs (all labs ordered are listed, but only abnormal results are displayed) Labs Reviewed - No data to display  EKG   Radiology No results found.  Procedures Procedures (including critical care time)  Medications Ordered in UC Medications - No data to display  Initial Impression / Assessment and Plan / UC Course  I have reviewed the triage vital signs and the nursing notes.  Pertinent labs & imaging results that were available during my care of the patient were reviewed by me and considered in my medical decision making (see chart for details).     Nystatin cream as below for candida. F/u with us or PCP if symptoms worsen/persist.   Return precautions- chest pain, shortness of breath, new/worsening fevers/chills, confusion, worsening of symptoms despite the above treatment plan, etc.     Final Clinical Impressions(s) / UC Diagnoses   Final diagnoses:  Skin candidiasis     Discharge Instructions     Apply nystatin cream to skin 2x daily for 2 weeks. Keep the area as dry as possible. You can try applying baby powder to keep the area dry, and avoid wearing bra while at home.    ED Prescriptions      Medication Sig Dispense Auth. Provider   nystatin cream (MYCOSTATIN) Apply to affected area 2 times daily for  2 weeks. 30 g Graham, Laura E, PA-C     PDMP not reviewed this encounter.   Graham, Laura E, PA-C 04/11/20 1526  

## 2020-04-17 ENCOUNTER — Other Ambulatory Visit: Payer: Self-pay

## 2020-04-18 ENCOUNTER — Ambulatory Visit (INDEPENDENT_AMBULATORY_CARE_PROVIDER_SITE_OTHER): Payer: Medicare HMO | Admitting: Internal Medicine

## 2020-04-18 ENCOUNTER — Encounter: Payer: Self-pay | Admitting: Internal Medicine

## 2020-04-18 VITALS — BP 124/82 | HR 66 | Temp 97.8°F | Resp 18 | Ht 67.0 in | Wt 196.1 lb

## 2020-04-18 DIAGNOSIS — B369 Superficial mycosis, unspecified: Secondary | ICD-10-CM | POA: Diagnosis not present

## 2020-04-18 MED ORDER — KETOCONAZOLE 2 % EX CREA: 1.0000 "application " | TOPICAL_CREAM | Freq: Every day | CUTANEOUS | 0 refills | Status: DC

## 2020-04-18 NOTE — Progress Notes (Unsigned)
Subjective:    Patient ID: Lisa Meyers, female    DOB: 12-21-1955, 65 y.o.   MRN: [id number]  DOS:  04/18/2020 Type of visit - description: Acute  Went to the ER 04/11/2020, had a rash under her breasts, DX skin candidiasis.  Was treated with nystatin cream.  Patient reports the area is slightly better but is still very itchy.  Also developed a rash the next day, the new rash was located at the back.  Does not hurt or itch. Denies fever chills  Review of Systems See above   Past Medical History:  Diagnosis Date  . Diabetes mellitus without complication ([place])   . Hyperlipidemia 02/11/2014  . Hypertension   . Neck pain   . 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 ABDOMINAL HYSTERECTOMY     abdominal, no oophorectomy  . BREAST CYST EXCISION    . CYST REMOVAL LEG  07-2013   inner L thigh, Dr Tommi Rumps, [place] as of 04/18/2020   No Known Allergies     Medication List       Accurate as of April 18, 2020  9:46 AM. If you have any questions, ask your nurse or doctor.        blood glucose meter kit and supplies Dispense based on patient and insurance preference. Check blood sugar once daily.   DAILY MULTIVITAMIN PO Take by mouth.   hydrochlorothiazide 25 MG tablet Commonly known as: HYDRODIURIL TAKE 1 TABLET BY MOUTH EVERY DAY   lisinopril 40 MG tablet Commonly known as: ZESTRIL Take 1 tablet (40 mg total) by mouth daily.   lovastatin 20 MG tablet Commonly known as: MEVACOR Take 1 tablet (20 mg total) by mouth at bedtime.   metFORMIN 1000 MG tablet Commonly known as: GLUCOPHAGE Take 1 tablet (1,000 mg total) by mouth 2 (two) times daily with a meal.   nystatin cream Commonly known as: MYCOSTATIN Apply to affected area 2 times daily for 2 weeks.   OneTouch Ultra test strip Generic drug: glucose blood Check blood sugars once daily   vitamin C 100 MG tablet Take 100 mg by mouth daily.   VITAMIN D  (ERGOCALCIFEROL) PO Take by mouth.          Objective:   Physical Exam BP 124/82 (BP Location: Left Arm, Patient Position: Sitting, Cuff Size: Normal)   Pulse 66   Temp 97.8 F (36.6 C) (Oral)   Resp 18   Ht $R'5\' 7"'eZ$  (1.702 m)   Wt 196 lb 2 oz (89 kg)   SpO2 100%   BMI 30.72 kg/m  General:   Well developed, NAD, BMI noted. HEENT:  Normocephalic . Face symmetric, atraumatic Skin: Under the breast, I see some erythema, worse on the left, on the left she also has a couple of satellite lesions with elevated borders. At the back, she has few/scattered areas of dryness/scaliness.  No blisters, no Christmas tree distribution Neurologic:  alert & oriented X3.  Speech normal, gait appropriate for age and unassisted Psych--  Cognition and judgment appear intact.  Cooperative with normal attention span and concentration.  Behavior appropriate. No anxious or depressed appearing.      Assessment      Assessment DM HTN. Amlodipine d/c 2019 d/t swelling  Hyperlipidemia + PPD 1990s + PPD 09/03/2015, 16 mm, x-ray negative, completed 3 months of INH 12/11/2015 at [place] Chronic neck, back  pain (x years).  PLAN: Dermatomycosis: Under the breasts, somewhat better with nystatin, recommend switch to ketoconazole and OTC hydrocortisone 1% Rash, back: non-specific, etiology unclear, recommend hydrocortisone 1%.   This visit occurred during the SARS-CoV-2 public health emergency.  Safety protocols were in place, including screening questions prior to the visit, additional usage of staff PPE, and extensive cleaning of exam room while observing appropriate contact time as indicated for disinfecting solutions.

## 2020-04-18 NOTE — Patient Instructions (Addendum)
Per our records you are due for an eye exam. Please contact your eye doctor to schedule an appointment. Please have them send copies of your office visit notes to [place]. Our fax number is [phone].  Stop nystatin  Start ketoconazole 2%: Apply twice a day under your breast Also apply over-the-counter hydrocortisone 1% in the same areas.  Twice a day.  For your back: Apply hydrocortisone 1% over-the-counter

## 2020-04-18 NOTE — Progress Notes (Unsigned)
Pre visit review using our clinic review tool, if applicable. No additional management support is needed unless otherwise documented below in the visit note. 

## 2020-04-19 NOTE — Assessment & Plan Note (Addendum)
Dermatomycosis: Under the breasts, somewhat better with nystatin, recommend switch to ketoconazole and OTC hydrocortisone 1% Rash, back: non-specific, etiology unclear, recommend hydrocortisone 1%.

## 2020-05-05 ENCOUNTER — Telehealth: Payer: Self-pay | Admitting: Internal Medicine

## 2020-05-05 NOTE — Telephone Encounter (Signed)
Needs an appt

## 2020-05-05 NOTE — Telephone Encounter (Signed)
Patient is requesting an MRI she feel heavy being pull to the floor She  feels very bad neck,should back are hurting her

## 2020-05-06 DIAGNOSIS — H5203 Hypermetropia, bilateral: Secondary | ICD-10-CM | POA: Diagnosis not present

## 2020-05-06 DIAGNOSIS — Z7984 Long term (current) use of oral hypoglycemic drugs: Secondary | ICD-10-CM | POA: Diagnosis not present

## 2020-05-06 DIAGNOSIS — H52223 Regular astigmatism, bilateral: Secondary | ICD-10-CM | POA: Diagnosis not present

## 2020-05-06 DIAGNOSIS — H524 Presbyopia: Secondary | ICD-10-CM | POA: Diagnosis not present

## 2020-05-06 DIAGNOSIS — H25093 Other age-related incipient cataract, bilateral: Secondary | ICD-10-CM | POA: Diagnosis not present

## 2020-05-06 DIAGNOSIS — E119 Type 2 diabetes mellitus without complications: Secondary | ICD-10-CM | POA: Diagnosis not present

## 2020-05-06 DIAGNOSIS — H11153 Pinguecula, bilateral: Secondary | ICD-10-CM | POA: Diagnosis not present

## 2020-05-06 DIAGNOSIS — I10 Essential (primary) hypertension: Secondary | ICD-10-CM | POA: Diagnosis not present

## 2020-05-06 LAB — HM DIABETES EYE EXAM

## 2020-05-12 ENCOUNTER — Encounter: Payer: Self-pay | Admitting: Internal Medicine

## 2020-06-04 ENCOUNTER — Other Ambulatory Visit: Payer: Self-pay | Admitting: Internal Medicine

## 2020-06-19 ENCOUNTER — Ambulatory Visit (INDEPENDENT_AMBULATORY_CARE_PROVIDER_SITE_OTHER): Payer: Medicare HMO | Admitting: Internal Medicine

## 2020-06-19 ENCOUNTER — Other Ambulatory Visit: Payer: Self-pay

## 2020-06-19 VITALS — BP 116/79 | HR 76 | Temp 97.5°F | Ht 67.0 in | Wt 199.0 lb

## 2020-06-19 DIAGNOSIS — I10 Essential (primary) hypertension: Secondary | ICD-10-CM | POA: Diagnosis not present

## 2020-06-19 MED ORDER — LISINOPRIL 20 MG PO TABS: 20.0000 mg | ORAL_TABLET | Freq: Every day | ORAL | 3 refills | Status: DC

## 2020-06-19 NOTE — Progress Notes (Signed)
Subjective:    Patient ID: Lisa Meyers, female    DOB: Jun 29, 1955, 65 y.o.   MRN: [id number]  DOS:  06/19/2020 Type of visit - description: acute  Over the last month, she noted her BPs to be low, as low as 90/80, 100/60. Low BP associated with "swimmy head". She decided to take lisinopril 40 mg every other day. Since then symptoms are gone and she feels better.   BP Readings from Last 3 Encounters:  06/19/20 116/79  04/18/20 124/82  04/11/20 (!) 143/90     Review of Systems Lower extremity edema improved, using tight stockings  Past Medical History:  Diagnosis Date  . Diabetes mellitus without complication ([place])   . Hyperlipidemia 02/11/2014  . Hypertension   . Neck pain   . 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 ABDOMINAL HYSTERECTOMY     abdominal, no oophorectomy  . BREAST CYST EXCISION    . CYST REMOVAL LEG  07-2013   inner L thigh, Dr Tommi Rumps, [place] as of 06/19/2020   No Known Allergies     Medication List       Accurate as of June 19, 2020 11:59 PM. If you have any questions, ask your nurse or doctor.        STOP taking these medications   ketoconazole 2 % cream Commonly known as: NIZORAL Stopped by: Kathlene November, MD   nystatin cream Commonly known as: MYCOSTATIN Stopped by: Kathlene November, MD     TAKE these medications   blood glucose meter kit and supplies Dispense based on patient and insurance preference. Check blood sugar once daily.   DAILY MULTIVITAMIN PO Take by mouth.   hydrochlorothiazide 25 MG tablet Commonly known as: HYDRODIURIL TAKE 1 TABLET BY MOUTH EVERY DAY   lisinopril 20 MG tablet Commonly known as: ZESTRIL Take 1 tablet (20 mg total) by mouth daily. What changed:   medication strength  how much to take Changed by: Kathlene November, MD   lovastatin 20 MG tablet Commonly known as: MEVACOR Take 1 tablet (20 mg total) by mouth at bedtime.   metFORMIN 1000 MG  tablet Commonly known as: GLUCOPHAGE Take 1 tablet (1,000 mg total) by mouth 2 (two) times daily with a meal.   OneTouch Ultra test strip Generic drug: glucose blood Check blood sugars once daily   vitamin C 100 MG tablet Take 100 mg by mouth daily.   VITAMIN D (ERGOCALCIFEROL) PO Take by mouth.          Objective:   Physical Exam BP 116/79 (BP Location: Left Arm, Patient Position: Sitting, Cuff Size: Large)   Pulse 76   Temp (!) 97.5 F (36.4 C) (Oral)   Ht _0  (1.702 m)   Wt 199 lb (90.3 kg)   SpO2 100%   BMI 31.17 kg/m  General:   Well developed, NAD, BMI noted. HEENT:  Normocephalic . Face symmetric, atraumatic Lungs:  CTA B Normal respiratory effort, no intercostal retractions, no accessory muscle use. Heart: RRR,  no murmur.  Lower extremities: Using tight stockings but no compression stockings per se.  No edema noted today. Skin: Not pale. Not jaundice Neurologic:  alert & oriented X3.  Speech normal, gait appropriate for age and unassisted Psych--  Cognition and judgment appear intact.  Cooperative with normal attention span and concentration.  Behavior appropriate. No anxious or depressed appearing.      Assessment  Assessment DM HTN. Amlodipine d/c 2019 d/t swelling  Hyperlipidemia + PPD 1990s + PPD 09/03/2015, 16 mm, x-ray negative, completed 3 months of INH 12/11/2015 at [place] Chronic neck, back pain (x years).  PLAN: HTN: Overcontrolled, BP has been as low as 100/60, 90/80.  Self decrease lisinopril 40 mg to every other day. Plan: Change to lisinopril 20 mg daily, continue HCTZ, monitor BPs. Labs on RTC RTC 3 to 4 weeks, CPX fasting.     This visit occurred during the SARS-CoV-2 public health emergency.  Safety protocols were in place, including screening questions prior to the visit, additional usage of staff PPE, and extensive cleaning of exam room while observing appropriate contact time as indicated  for disinfecting solutions.

## 2020-06-19 NOTE — Patient Instructions (Addendum)
Will  change lisinopril to 20 mg. Take 1 tablet daily along with all your other medications  Continue checking your blood pressures BP GOAL is between 110/65 and  135/85. If it is consistently higher or lower, let me know    GO TO THE FRONT DESK, PLEASE SCHEDULE YOUR APPOINTMENTS Come back for a physical exam in 3 to 4 weeks, fasting

## 2020-06-22 NOTE — Assessment & Plan Note (Signed)
HTN: Overcontrolled, BP has been as low as 100/60, 90/80.  Self decrease lisinopril 40 mg to every other day. Plan: Change to lisinopril 20 mg daily, continue HCTZ, monitor BPs. Labs on RTC RTC 3 to 4 weeks, CPX fasting.

## 2020-08-05 ENCOUNTER — Encounter: Payer: Self-pay | Admitting: Internal Medicine

## 2020-08-05 ENCOUNTER — Encounter (HOSPITAL_BASED_OUTPATIENT_CLINIC_OR_DEPARTMENT_OTHER): Payer: Self-pay

## 2020-08-05 ENCOUNTER — Other Ambulatory Visit: Payer: Self-pay

## 2020-08-05 ENCOUNTER — Ambulatory Visit (INDEPENDENT_AMBULATORY_CARE_PROVIDER_SITE_OTHER): Payer: Medicare HMO | Admitting: Internal Medicine

## 2020-08-05 ENCOUNTER — Ambulatory Visit (HOSPITAL_BASED_OUTPATIENT_CLINIC_OR_DEPARTMENT_OTHER)
Admission: RE | Admit: 2020-08-05 | Discharge: 2020-08-05 | Disposition: A | Discharge status: Home / Self Care | Payer: Medicare HMO | Source: Ambulatory Visit | Attending: Internal Medicine | Admitting: Internal Medicine

## 2020-08-05 VITALS — BP 119/86 | HR 59 | Temp 97.1°F | Ht 67.0 in | Wt 204.0 lb

## 2020-08-05 DIAGNOSIS — Z0000 Encounter for general adult medical examination without abnormal findings: Secondary | ICD-10-CM

## 2020-08-05 DIAGNOSIS — E119 Type 2 diabetes mellitus without complications: Secondary | ICD-10-CM

## 2020-08-05 DIAGNOSIS — Z1231 Encounter for screening mammogram for malignant neoplasm of breast: Secondary | ICD-10-CM | POA: Diagnosis not present

## 2020-08-05 DIAGNOSIS — I10 Essential (primary) hypertension: Secondary | ICD-10-CM

## 2020-08-05 DIAGNOSIS — E785 Hyperlipidemia, unspecified: Secondary | ICD-10-CM | POA: Diagnosis not present

## 2020-08-05 DIAGNOSIS — Z1211 Encounter for screening for malignant neoplasm of colon: Secondary | ICD-10-CM

## 2020-08-05 LAB — LIPID PANEL
Cholesterol: 176 mg/dL (ref 0–200)
HDL: 69.2 mg/dL (ref >39.00)
LDL Cholesterol: 94 mg/dL (ref 0–99)
NonHDL: 106.35
Total CHOL/HDL Ratio: 3
Triglycerides: 62 mg/dL (ref 0.0–149.0)
VLDL: 12.4 mg/dL (ref 0.0–40.0)

## 2020-08-05 LAB — CBC WITH DIFFERENTIAL/PLATELET
Basophils Absolute: 0 10*3/uL (ref 0.0–0.1)
Basophils Relative: 0.5 % (ref 0.0–3.0)
Eosinophils Absolute: 0 10*3/uL (ref 0.0–0.7)
Eosinophils Relative: 1.3 % (ref 0.0–5.0)
HCT: 36.7 % (ref 36.0–46.0)
Hemoglobin: 12.3 g/dL (ref 12.0–15.0)
Lymphocytes Relative: 68.4 % — ABNORMAL HIGH (ref 12.0–46.0)
Lymphs Abs: 2.1 10*3/uL (ref 0.7–4.0)
MCHC: 33.5 g/dL (ref 30.0–36.0)
MCV: 90.5 fl (ref 78.0–100.0)
Monocytes Absolute: 0.3 10*3/uL (ref 0.1–1.0)
Monocytes Relative: 8.8 % (ref 3.0–12.0)
Neutro Abs: 0.7 10*3/uL — ABNORMAL LOW (ref 1.4–7.7)
Neutrophils Relative %: 21 % — ABNORMAL LOW (ref 43.0–77.0)
Platelets: 214 10*3/uL (ref 150.0–400.0)
RBC: 4.05 Mil/uL (ref 3.87–5.11)
RDW: 13.6 % (ref 11.5–15.5)
WBC: 3.1 10*3/uL — ABNORMAL LOW (ref 4.0–10.5)

## 2020-08-05 LAB — COMPREHENSIVE METABOLIC PANEL
ALT: 13 U/L (ref 0–35)
AST: 15 U/L (ref 0–37)
Albumin: 3.9 g/dL (ref 3.5–5.2)
Alkaline Phosphatase: 59 U/L (ref 39–117)
BUN: 19 mg/dL (ref 6–23)
CO2: 29 mEq/L (ref 19–32)
Calcium: 9.7 mg/dL (ref 8.4–10.5)
Chloride: 103 mEq/L (ref 96–112)
Creatinine, Ser: 0.76 mg/dL (ref 0.40–1.20)
GFR: 81.17 mL/min (ref >60.00)
Glucose, Bld: 102 mg/dL — ABNORMAL HIGH (ref 70–99)
Potassium: 3.7 mEq/L (ref 3.5–5.1)
Sodium: 139 mEq/L (ref 135–145)
Total Bilirubin: 0.4 mg/dL (ref 0.2–1.2)
Total Protein: 7.1 g/dL (ref 6.0–8.3)

## 2020-08-05 LAB — HEMOGLOBIN A1C: Hgb A1c MFr Bld: 6.7 % — ABNORMAL HIGH (ref 4.6–6.5)

## 2020-08-05 MED ORDER — SHINGRIX 50 MCG/0.5ML IM SUSR: 0.5000 mL | Freq: Once | INTRAMUSCULAR | 1 refills | Status: AC

## 2020-08-05 NOTE — Assessment & Plan Note (Signed)
Here for CPX DM: Currently on metformin, check A1c. HTN: On lisinopril, checking labs, ambulatory BP typically very good in the 119/50s or 60s range. High cholesterol: On Mevacor, labs. R shoulder pain: Suspect rotator cuff tendinitis, symptoms are mild and getting better, we agreed on Tylenol, call for sports medicine referral if not better. RTC 6 months

## 2020-08-05 NOTE — Progress Notes (Signed)
Subjective:    Patient ID: Lisa Meyers, female    DOB: [birth year], 65 y.o.   MRN: [id number]  DOS:  08/05/2020 Type of visit - description: Here for CPX  Since the last office visit in general she is feeling well.  Review of Systems Developed a right shoulder pain approximately 2 weeks ago, no fall or injury, she does a lot of lifting at work. The pain is located at the top of the shoulder, worse with arm elevation, not worse with head turning.  Overall is feeling better today.  Other than above, a 14 point review of systems is negative      Past Medical History:  Diagnosis Date  . Diabetes mellitus without complication ([place])   . Hyperlipidemia 02/11/2014  . Hypertension   . Neck pain   . 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 ABDOMINAL HYSTERECTOMY     abdominal, no oophorectomy  . BREAST CYST EXCISION    . BREAST EXCISIONAL BIOPSY Right   . CYST REMOVAL LEG  07-2013   inner L thigh, Dr Tommi Rumps, [place] History  . Marital status: Single    Spouse name: Not on file  . Number of children: 1  . Years of education: Not on file  . Highest education level: Not on file  Occupational History  . Occupation: CNA -- [employer]   Tobacco Use  . Smoking status: Never Smoker  . Smokeless tobacco: Never Used  Substance and Sexual Activity  . Alcohol use: Yes    Alcohol/week: 0.0 standard drinks    Comment: wine, rare   . Drug use: No  . Sexual activity: Not on file  Other Topics Concern  . Not on file  Social History Narrative   Born in [place]   Live in [place] x years, then [place], moved to [place] 2015   Lives by herself   Social Determinants of Health   Financial Resource Strain: Not on [employer] Insecurity: Not on file  Transportation Needs: Not on file  Physical Activity: Not on file  Stress: Not on file  Social Connections: Not on file  Intimate Partner Violence: Not on  file      Allergies as of 08/05/2020   No Known Allergies     Medication List       Accurate as of August 05, 2020  8:30 PM. If you have any questions, ask your nurse or doctor.        blood glucose meter kit and supplies Dispense based on patient and insurance preference. Check blood sugar once daily.   DAILY MULTIVITAMIN PO Take by mouth.   hydrochlorothiazide 25 MG tablet Commonly known as: HYDRODIURIL TAKE 1 TABLET BY MOUTH EVERY DAY   lisinopril 20 MG tablet Commonly known as: ZESTRIL Take 1 tablet (20 mg total) by mouth daily.   lovastatin 20 MG tablet Commonly known as: MEVACOR Take 1 tablet (20 mg total) by mouth at bedtime.   metFORMIN 1000 MG tablet Commonly known as: GLUCOPHAGE Take 1 tablet (1,000 mg total) by mouth 2 (two) times daily with a meal.   OneTouch Ultra test strip Generic drug: glucose blood Check blood sugars once daily   Shingrix injection Generic drug: Zoster Vaccine Adjuvanted Inject 0.5 mLs into the muscle once for 1 dose. Started by: Kathlene November, MD   vitamin C 100 MG tablet Take 100 mg  by mouth daily.   VITAMIN D (ERGOCALCIFEROL) PO Take by mouth.          Objective:   Physical Exam BP 119/86 (BP Location: Left Arm, Patient Position: Sitting, Cuff Size: Large)   Pulse (!) 59   Temp (!) 97.1 F (36.2 C) (Temporal)   Ht $R'5\' 7"'HH$  (1.702 m)   Wt 204 lb (92.5 kg)   SpO2 100%   BMI 31.95 kg/m  General: Well developed, NAD, BMI noted Neck: No  thyromegaly  HEENT:  Normocephalic . Face symmetric, atraumatic Lungs:  CTA B Normal respiratory effort, no intercostal retractions, no accessory muscle use. Heart: RRR,  no murmur.  Abdomen:  Not distended, soft, non-tender. No rebound or rigidity.   Lower extremities: no pretibial edema bilaterally MSK: No TTP of the cervical spine L shoulder: Normal to palpation, normal range of motion R shoulder: Symmetric, normal to palpation.  + Pain elicited with arm elevation.  Moderate  pain. Skin: Exposed areas without rash. Not pale. Not jaundice Neurologic:  alert & oriented X3.  Speech normal, gait appropriate for age and unassisted Strength symmetric and appropriate for age.  Psych: Cognition and judgment appear intact.  Cooperative with normal attention span and concentration.  Behavior appropriate. No anxious or depressed appearing.     Assessment      Assessment DM HTN. Amlodipine d/c 2019 d/t swelling  Hyperlipidemia + PPD 1990s + PPD 09/03/2015, 16 mm, x-ray negative, completed 3 months of INH 12/11/2015 at [place] Chronic neck, back pain (x years).  PLAN: Here for CPX DM: Currently on metformin, check A1c. HTN: On lisinopril, checking labs, ambulatory BP typically very good in the 119/50s or 60s range. High cholesterol: On Mevacor, labs. R shoulder pain: Suspect rotator cuff tendinitis, symptoms are mild and getting better, we agreed on Tylenol, call for sports medicine referral if not better. RTC 6 months    This visit occurred during the SARS-CoV-2 public health emergency.  Safety protocols were in place, including screening questions prior to the visit, additional usage of staff PPE, and extensive cleaning of exam room while observing appropriate contact time as indicated for disinfecting solutions.

## 2020-08-05 NOTE — Patient Instructions (Signed)
Check the  blood pressure regularly. BP GOAL is between 110/65 and  135/85. If it is consistently higher or lower, let me know  We are referring you to have a COLOGUARD test, they should be contacting you within few days, please be sure your insurance cover the test before you send the sample  GO TO THE LAB : Get the blood work     GO TO THE FRONT DESK, PLEASE SCHEDULE YOUR APPOINTMENTS Come back for a checkup in 6 months  STOP BY THE FIRST FLOOR: Schedule mammogram    "Living will", "Health Care Power of attorney": Advanced care planning  (If you already have a living will or healthcare power of attorney, please bring the copy to be scanned in your chart.)  Advance care planning is a process that supports adults in  understanding and sharing their preferences regarding future medical care.   The patient's preferences are recorded in documents called Advance Directives.    Advanced directives are completed (and can be modified at any time) while the patient is in full mental capacity.   The documentation should be available at all times to the patient, the family and the healthcare providers.  Bring in a copy to be scanned in your chart is an excellent idea and is recommended   This legal documents direct treatment decision making and/or appoint a surrogate to make the decision if the patient is not capable to do so.    Advance directives can be documented in many types of formats,  documents have names such as:  Lliving will  Durable power of attorney for healthcare (healthcare proxy or healthcare power of attorney)  Combined directives  Physician orders for life-sustaining treatment    More information at:  [email or web address]

## 2020-08-05 NOTE — Assessment & Plan Note (Signed)
-  Td 11-2016 -PNM 13 : 2021 - PNM 23: declined, benefits d/w pt   -shingres RX printed  -  covid vax x 3, booster d/w pt, reluctant to proceed -Female care  MMG 07/2019 neg, ordering a mammogram again today. Gynecology visit: 06/2018, had a pelvic exam, h/o hysterectomy no need for Paps. -CCS: Stool test negative last year, again3 modalities discussed extensively, elected cologuard, will arrange, rec to check w/ insurance before send the sample  -Diet and exercise recommend -DEXA normal 07/23/2019 -Advance care discussed. - labs : CMP, FLP, CBC, A1c

## 2020-08-08 ENCOUNTER — Other Ambulatory Visit: Payer: Self-pay | Admitting: Internal Medicine

## 2020-08-08 NOTE — Telephone Encounter (Signed)
Pt needing refill on metformin 1000mg  bid- A1c on 08/05/20 was 6.7, okay to refill?

## 2020-08-08 NOTE — Telephone Encounter (Signed)
Okay 22-month supply

## 2020-08-11 DIAGNOSIS — Z1211 Encounter for screening for malignant neoplasm of colon: Secondary | ICD-10-CM | POA: Diagnosis not present

## 2020-08-20 ENCOUNTER — Other Ambulatory Visit: Payer: Self-pay

## 2020-08-20 DIAGNOSIS — Z1211 Encounter for screening for malignant neoplasm of colon: Secondary | ICD-10-CM

## 2020-08-20 LAB — COLOGUARD: Cologuard: NEGATIVE

## 2020-09-06 ENCOUNTER — Other Ambulatory Visit: Payer: Self-pay | Admitting: Internal Medicine

## 2020-09-07 ENCOUNTER — Other Ambulatory Visit: Payer: Self-pay | Admitting: Internal Medicine

## 2020-09-10 ENCOUNTER — Encounter: Payer: Self-pay | Admitting: Internal Medicine

## 2020-10-15 ENCOUNTER — Other Ambulatory Visit: Payer: Self-pay | Admitting: Internal Medicine

## 2020-11-27 ENCOUNTER — Telehealth: Payer: Self-pay | Admitting: Internal Medicine

## 2020-11-27 NOTE — Telephone Encounter (Signed)
Left message for patient to call back and schedule Medicare Annual Wellness Visit (AWV) in office.   If not able to come in office, please offer to do virtually or by telephone.  Left office number and my jabber #[phone].  Due for AWVI  Please schedule at anytime with Nurse Health Advisor.   

## 2020-12-12 ENCOUNTER — Ambulatory Visit (INDEPENDENT_AMBULATORY_CARE_PROVIDER_SITE_OTHER): Payer: Medicare HMO | Admitting: Internal Medicine

## 2020-12-12 ENCOUNTER — Encounter: Payer: Self-pay | Admitting: Internal Medicine

## 2020-12-12 ENCOUNTER — Other Ambulatory Visit: Payer: Self-pay

## 2020-12-12 VITALS — BP 126/72 | HR 68 | Temp 98.1°F | Resp 16 | Ht 67.0 in | Wt 191.0 lb

## 2020-12-12 DIAGNOSIS — G8929 Other chronic pain: Secondary | ICD-10-CM

## 2020-12-12 DIAGNOSIS — M25561 Pain in right knee: Secondary | ICD-10-CM

## 2020-12-12 DIAGNOSIS — M25562 Pain in left knee: Secondary | ICD-10-CM

## 2020-12-12 NOTE — Assessment & Plan Note (Signed)
Knee pain, bilateral, chronic: Possibly related to standing at work for long hours, exam is benign.  I do think she would benefit from physical therapy for lower extremity strengthening and increased flexibility. Plan: Refer to PT, judicious use of Tylenol and ibuprofen. Vaccines: See AVS, patient advised

## 2020-12-12 NOTE — Progress Notes (Signed)
Subjective:    Patient ID: Lisa Meyers, female    DOB: 11-Nov-1955, 66 y.o.   MRN: [id number]  DOS:  12/12/2020 Type of visit - description: Acute  Today she reports pain/swelling at both knees. Denies any fever chills No injury. She still works long hours on her feet. This is actually a chronic issue, was seen on October 2020 at Ortho, see comments below. Overall the pain is a slightly better but she is a still concerned  Ortho comments 01-2019 Plan: Impression is relatively normal radiographs and examination of both knees.  No effusion today.  She does have soft tissue envelope which is prominent around both knees.  No significant patellar apprehension or patellofemoral grinding or crepitus.  Collateral and cruciate ligaments are stable.  Plan at this time is observation.  I do not think there is a significant pathologic process going on in the knee at this time on the right or left hand side.  I will see her back as needed   Wt Readings from Last 3 Encounters:  12/12/20 191 lb (86.6 kg)  08/05/20 204 lb (92.5 kg)  06/19/20 199 lb (90.3 kg)     Review of Systems See above   Past Medical History:  Diagnosis Date   Diabetes mellitus without complication (HCC)    Hyperlipidemia 02/11/2014   Hypertension    Neck pain    PPD positive dx in the 90s    Past Surgical History:  Procedure Laterality Date   ABDOMINAL HYSTERECTOMY     abdominal, no oophorectomy   BREAST CYST EXCISION     BREAST EXCISIONAL BIOPSY Right    CYST REMOVAL LEG  07-2013   inner L thigh, Dr Vickki Muff, [facility]     Allergies as of 12/12/2020   No Known Allergies      Medication List        Accurate as of December 12, 2020  8:47 AM. If you have any questions, ask your nurse or doctor.          blood glucose meter kit and supplies Dispense based on patient and insurance preference. Check blood sugar once daily.   DAILY MULTIVITAMIN PO Take by mouth.   hydrochlorothiazide 25 MG  tablet Commonly known as: HYDRODIURIL TAKE 1 TABLET BY MOUTH EVERY DAY   lisinopril 20 MG tablet Commonly known as: ZESTRIL Take 1 tablet (20 mg total) by mouth daily.   lovastatin 20 MG tablet Commonly known as: MEVACOR Take 1 tablet (20 mg total) by mouth at bedtime.   metFORMIN 1000 MG tablet Commonly known as: GLUCOPHAGE Take 1 tablet (1,000 mg total) by mouth 2 (two) times daily with a meal.   OneTouch Ultra test strip Generic drug: glucose blood Check blood sugars once daily   vitamin C 100 MG tablet Take 100 mg by mouth daily.   VITAMIN D (ERGOCALCIFEROL) PO Take by mouth.           Objective:   Physical Exam BP 126/72 (BP Location: Left Arm, Patient Position: Sitting, Cuff Size: Small)   Pulse 68   Temp 98.1 F (36.7 C) (Oral)   Resp 16   Ht 5\' 7"  (1.702 m)   Wt 191 lb (86.6 kg)   SpO2 96%   BMI 29.91 kg/m  General:   Well developed, NAD, BMI noted. HEENT:  Normocephalic . Face symmetric, atraumatic Lower extremities: Knees are symmetric, she has some soft tissue around the knees but no effusion per se. Range of motion is normal,  she did report some pain with forced flexion. Both knees are stable. Skin: Not pale. Not jaundice Neurologic:  alert & oriented X3.  Speech normal, gait appropriate for age and unassisted Psych--  Cognition and judgment appear intact.  Cooperative with normal attention span and concentration.  Behavior appropriate. No anxious or depressed appearing.      Assessment     Assessment DM HTN. Amlodipine d/c 2019 d/t swelling  Hyperlipidemia + PPD 1990s + PPD 09/03/2015, 16 mm, x-ray negative, completed 3 months of INH 12/11/2015 at [facility] Chronic neck, back pain (x years).  PLAN: Knee pain, bilateral, chronic: Possibly related to standing at work for long hours, exam is benign.  I do think she would benefit from physical therapy for lower extremity strengthening and increased  flexibility. Plan: Refer to PT, judicious use of Tylenol and ibuprofen. Vaccines: See AVS, patient advised  This visit occurred during the SARS-CoV-2 public health emergency.  Safety protocols were in place, including screening questions prior to the visit, additional usage of staff PPE, and extensive cleaning of exam room while observing appropriate contact time as indicated for disinfecting solutions.

## 2020-12-12 NOTE — Patient Instructions (Addendum)
Recommend to proceed with the following vaccines at your pharmacy:  Covid #4 Shingrix #2 (shingles) Flu shot this fall  For knee pain: We will refer you to physical therapy You can use ice at the end of the day. You can use either Tylenol or ibuprofen once or twice a day.

## 2021-01-15 ENCOUNTER — Ambulatory Visit: Payer: Medicare HMO | Attending: Internal Medicine | Admitting: Physical Therapy

## 2021-01-15 ENCOUNTER — Encounter: Payer: Self-pay | Admitting: Physical Therapy

## 2021-01-15 ENCOUNTER — Other Ambulatory Visit: Payer: Self-pay

## 2021-01-15 DIAGNOSIS — M6281 Muscle weakness (generalized): Secondary | ICD-10-CM | POA: Diagnosis not present

## 2021-01-15 DIAGNOSIS — M25551 Pain in right hip: Secondary | ICD-10-CM | POA: Diagnosis not present

## 2021-01-15 DIAGNOSIS — M25561 Pain in right knee: Secondary | ICD-10-CM | POA: Diagnosis not present

## 2021-01-15 DIAGNOSIS — M25562 Pain in left knee: Secondary | ICD-10-CM | POA: Insufficient documentation

## 2021-01-15 DIAGNOSIS — M25552 Pain in left hip: Secondary | ICD-10-CM | POA: Insufficient documentation

## 2021-01-15 DIAGNOSIS — R2689 Other abnormalities of gait and mobility: Secondary | ICD-10-CM | POA: Insufficient documentation

## 2021-01-15 DIAGNOSIS — M545 Low back pain, unspecified: Secondary | ICD-10-CM | POA: Diagnosis not present

## 2021-01-15 NOTE — Patient Instructions (Signed)
Access Code: [email or web address] URL: [email or web address] Date: 01/15/2021 Prepared by: Alphonzo Severance  Exercises Supine Quad Set - 3 x daily - 7 x weekly - 2 sets - 10 reps - 10 hold Supine Bridge - 1 x daily - 7 x weekly - 3 sets - 10 reps

## 2021-01-15 NOTE — Therapy (Signed)
[facility] [address] [place], [place], [address] Phone: [phone]   Fax:  [phone]  Physical Therapy Evaluation  Patient Details  Name: Lisa Meyers MRN: [id number] Date of Birth: 05/13/1955 Referring Provider (PT): Wanda Plump, MD  Encounter Date: 01/15/2021   PT End of Session - 01/15/21 1550     Visit Number 1    Number of Visits 16    Date for PT Re-Evaluation 03/12/21    Authorization Type MCR    Progress Note Due on Visit 10    PT Start Time 1409   Pt arrived late   PT Stop Time 1445    PT Time Calculation (min) 36 min    Activity Tolerance Patient tolerated treatment well    Behavior During Therapy [facility] for tasks assessed/performed             Past Medical History:  Diagnosis Date   Diabetes mellitus without complication (HCC)    Hyperlipidemia 02/11/2014   Hypertension    Neck pain    PPD positive dx in the 90s    Past Surgical History:  Procedure Laterality Date   ABDOMINAL HYSTERECTOMY     abdominal, no oophorectomy   BREAST CYST EXCISION     BREAST EXCISIONAL BIOPSY Right    CYST REMOVAL LEG  07-2013   inner L thigh, Dr Vickki Muff, [facility]     There were no vitals filed for this visit.    Subjective Assessment - 01/15/21 1548     Subjective Lisa Meyers is a 65 y.o. female who presents to clinic with chief complaint of bil knee pain R>L.  MOI/History of condition: starting about 1 year ago d/t OA.  "When I stand up if feels like I don't have cushion" Denies clicking, popping, locking.  She also has some concurrent R hip and low back pain. Pain location: Diffuse bil knee R>L.    Red flags: denies.  48 hour pain intensity:  highest 5/10, current 0/10, best 0/10.  Aggs: standing, walking (starts to hurt immediately), avoids stairs.  Eases: rest.  Nature: aching discomfort.  Severity: mod.  Irritability: mod.  Stage: chronic.  Stability: getting worse.  24 hour pattern: worse in the morning.   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  Hobbies: NA.  Functional limitations/goals: walk without pain, work with reduced pain.  Home environment: lives alone, 1 STE, single story dwelling.  Assistive device: no.   Hand dominance: R.  Falls: none.    Pertinent History Diabetes                [facility] PT Assessment - 01/15/21 0001       Assessment   Medical Diagnosis Referral diagnosis: Bilateral chronic knee pain (M25.561, M25.562, G89.29)    Referring Provider (PT) Wanda Plump, MD    Onset Date/Surgical Date 01/16/20    Hand Dominance Right      Precautions   Precaution Comments diabetes      Restrictions   Weight Bearing Restrictions No      Balance Screen   Has the patient fallen in the past 6 months No      Prior Function   Level of Independence Independent      Observation/Other Assessments   Observations GAIT: limited heel strike bil, reduced step height and length bil  moderate edema R>L knees    Focus on Therapeutic Outcomes (FOTO)  next visit      Sensation   Light  Touch Appears Intact      Functional Tests   Functional tests Sit to Stand;Other;Other2      Sit to Stand   Comments 30'' STS: 9x  UE used? Y      Other:   Other/ Comments 10 m max gait speed: 9.5'', 1.05 m/s, AD: N      Other:   Other/Comments S/L bridge: only partial ROM      ROM / Strength   AROM / PROM / Strength Strength      Strength   Overall Strength Comments bil hip abd strength 3-/5    Strength Assessment Site Knee    Right/Left Knee Right;Left    Right Knee Flexion 3/5    Right Knee Extension 3+/5    Left Knee Flexion 3+/5    Left Knee Extension 3+/5      Flexibility   Soft Tissue Assessment /Muscle Length yes    Hamstrings WNL    Quadriceps prone to 90 degrees bil      Ambulation/Gait   Ambulation/Gait Yes      Balance   Balance Assessed --   Tandem: R in rear 3'', L in rear 4''                        Objective measurements completed on examination: See above findings.                PT Education - 01/15/21 1548     Education Details POC, diagnosis, prognosis, HEP.  Pt educated via explanation, demonstration, and handout (HEP).  Pt confirms understanding verbally.              PT Short Term Goals - 01/15/21 1553       PT SHORT TERM GOAL #1   Title Giulietta K Goodchild will be >75% HEP compliant to improve carryover between sessions and facilitate independent management of condition    Target Date 02/05/21               PT Long Term Goals - 01/15/21 1553       PT LONG TERM GOAL #1   Title Ralene K Yo will achieve 110 degrees prone knee flexion by D/C (see POC end date) to improve ability to safely navigate steps in the community  EVAL: 90 degrees bil  target date: 03/12/21      PT LONG TERM GOAL #2   Title Iver K Huhn will be able to stand for >30'' in tandem stance, to show a significant improvement in balance in order to reduce fall risk  EVAL: R in rear 3'', L in rear 4'''  target date: 03/13/21      PT LONG TERM GOAL #3   Title Teshia K Shugars will improve 10 meter max gait speed to 1.2 m/s (.1 m/s MCID) to show functional improvement in ambulation  EVAL: 1.05 m/s  target date: 03/12/21      PT LONG TERM GOAL #4   Title Niamya K Figiel will improve the following MMTs to >/= 4/5 to show improvement in strength:  bil knee ext/flexion, bil hip abduction  EVAL: see flowsheet  target date: 03/13/21      PT LONG TERM GOAL #5   Title FOTO goal                    Plan - 01/15/21 1551     Clinical Impression Statement Lisa Meyers is a 65 y.o. female who  presents to clinic with signs and sxs consistent with bil knee pain secondary to degenerative changes.  Pt presents with pain and impairments/deficits in: bil knee and hip strength, gait, balance, quad length.  Activity limitations include: ambulation over longer  distances, stairs, steps.  Participation limitations include: working out at [employer], working without pain.  Pt will benefit from skilled therapy to address pain and the listed deficits in order to achieve functional goals, enable safety and independence in completion of daily tasks, and return to PLOF.    Stability/Clinical Decision Making Stable/Uncomplicated    Clinical Decision Making Low    Rehab Potential Good    PT Frequency 2x / week    PT Duration 8 weeks    PT Treatment/Interventions ADLs/Self Care Home Management;Aquatic Therapy;Electrical Stimulation;Iontophoresis 4mg /ml Dexamethasone;Gait training;Therapeutic activities;Therapeutic exercise;Neuromuscular re-education;Manual techniques;Dry needling;Vasopneumatic Device;Spinal Manipulations;Joint Manipulations    PT Next Visit Plan Take FOTO and create goal, progressive knee/hip/core strengthening, prone quad (RF) stretching, balance    PT Home Exercise Plan [email or web address]    Consulted and Agree with Plan of Care Patient             Patient will benefit from skilled therapeutic intervention in order to improve the following deficits and impairments:  Abnormal gait, Decreased balance, Decreased endurance, Pain, Impaired flexibility, Decreased strength  Visit Diagnosis: Right knee pain, unspecified chronicity  Balance problem  Other abnormalities of gait and mobility  Muscle weakness  Left knee pain, unspecified chronicity  Pain in left hip  Pain in right hip  Low back pain, unspecified back pain laterality, unspecified chronicity, unspecified whether sciatica present     Problem List Patient Active Problem List   Diagnosis Date Noted   Annual physical exam 11/26/2016   PCP NOTES >>>>>>>>>>>>> 05/31/2016   Hypertension 02/11/2014   Diabetes mellitus without complication (HCC) 02/11/2014   Hyperlipidemia 02/11/2014   PPD positive 02/11/2014    13/05/2013, PT 01/15/2021, 4:01 PM  [facility] [address] [place], [place], [place] Phone: [phone]   Fax:  [phone]  Name: TASHINA CREDIT MRN: Julious Payer Date of Birth: [birth year]

## 2021-01-19 ENCOUNTER — Telehealth: Payer: Self-pay | Admitting: Internal Medicine

## 2021-01-19 DIAGNOSIS — M542 Cervicalgia: Secondary | ICD-10-CM

## 2021-01-19 NOTE — Telephone Encounter (Signed)
Pt. Wanted to see if it was possible for her physical therapy referral to include her neck in the referral as well.

## 2021-01-19 NOTE — Telephone Encounter (Signed)
Will try to add neck pain.

## 2021-01-27 ENCOUNTER — Ambulatory Visit: Payer: Medicare HMO

## 2021-01-27 ENCOUNTER — Other Ambulatory Visit: Payer: Self-pay

## 2021-01-27 DIAGNOSIS — M6281 Muscle weakness (generalized): Secondary | ICD-10-CM

## 2021-01-27 DIAGNOSIS — M25551 Pain in right hip: Secondary | ICD-10-CM

## 2021-01-27 DIAGNOSIS — M25561 Pain in right knee: Secondary | ICD-10-CM

## 2021-01-27 DIAGNOSIS — M25552 Pain in left hip: Secondary | ICD-10-CM | POA: Diagnosis not present

## 2021-01-27 DIAGNOSIS — M25562 Pain in left knee: Secondary | ICD-10-CM

## 2021-01-27 DIAGNOSIS — R2689 Other abnormalities of gait and mobility: Secondary | ICD-10-CM

## 2021-01-27 DIAGNOSIS — M545 Low back pain, unspecified: Secondary | ICD-10-CM | POA: Diagnosis not present

## 2021-01-27 NOTE — Therapy (Signed)
[facility] Rehabilitation [facility] [address] [place], [place], [address] Phone: [phone]   Fax:  [phone]  Physical Therapy Treatment  Patient Details  Name: Lisa Meyers MRN: [id number] Date of Birth: 1955/07/28 Referring Provider (PT): Wanda Plump, MD   Encounter Date: 01/27/2021   PT End of Session - 01/27/21 2201     Visit Number 2    Number of Visits 16    Date for PT Re-Evaluation 03/12/21    Authorization Type MCR    Progress Note Due on Visit 10    PT Start Time 1508    PT Stop Time 1550    PT Time Calculation (min) 42 min    Activity Tolerance Patient tolerated treatment well    Behavior During Therapy [facility] for tasks assessed/performed             Past Medical History:  Diagnosis Date   Diabetes mellitus without complication (HCC)    Hyperlipidemia 02/11/2014   Hypertension    Neck pain    PPD positive dx in the 90s    Past Surgical History:  Procedure Laterality Date   ABDOMINAL HYSTERECTOMY     abdominal, no oophorectomy   BREAST CYST EXCISION     BREAST EXCISIONAL BIOPSY Right    CYST REMOVAL LEG  07-2013   inner L thigh, Dr Vickki Muff, [facility]     There were no vitals filed for this visit.    Subjective Assessment - 01/27/21 2153     Subjective Pt reports she has been completing her HEP daily and the exs have been helpful.    Currently in Pain? Yes    Pain Score 8     Pain Location Knee    Pain Orientation Right    Pain Descriptors / Indicators Aching    Pain Type Chronic pain    Pain Onset More than a month ago    Pain Frequency Intermittent    Multiple Pain Sites Yes    Pain Score 7    Pain Location Knee    Pain Orientation Left    Pain Descriptors / Indicators Aching    Pain Type Chronic pain    Pain Onset More than a month ago    Pain Frequency Intermittent                [facility] PT Assessment - 01/27/21 0001       Observation/Other Assessments   Focus on Therapeutic Outcomes (FOTO)   63% functional ability               PT Education - 01/27/21 2200     Education Details HEP updated with hip and knee strengthening exs.    Person(s) Educated Patient    Methods Explanation;Demonstration;Tactile cues;Verbal cues    Comprehension Verbalized understanding;Returned demonstration;Verbal cues required;Tactile cues required             [facility] Adult PT Treatment/Exercise:   Therapeutic Exercise: - Bridging 1x10, 3 sec hold - Quad set, small towel under knee, Rt and LT, 1x10, 5 sec hold - Small ROM SLR, with quad set prior, 1x10, 3 sec hold - SL hip abd, Lt and Rt, 1x10, 3 sec hold - LAQ, Lt and Rt, 1x10, 3 sec hold  Manual Therapy: - na  Neuromuscular re-ed: - na  Therapeutic Activity: - na  Self-care/Home Management: - HEP- see education     PT Short Term Goals - 01/15/21 1553       PT SHORT TERM  GOAL #1   Title Tiziana K Loria will be >75% HEP compliant to improve carryover between sessions and facilitate independent management of condition    Target Date 02/05/21               PT Long Term Goals - 01/27/21 2210       PT LONG TERM GOAL #5   Title PT's FOTO score will improve to the predicted value of 67%    Baseline 63%    Status New                   Plan - 01/27/21 2202     Clinical Impression Statement PT was completed for strengthening of both hips and knees with [facility] exs. Due to weakness, the pt was not able to complete a full SLR with either LE. Pt's subjective reports indicates consistent completion of her initial HEP. Pt toleratedtoday's PT sess without adverse effects. PT will benefit from skilled PT to increase LE strength, reduce pain, and optimize the pt's functional mobility    Stability/Clinical Decision Making Stable/Uncomplicated    Clinical Decision Making Low    Rehab Potential Good    PT Frequency 2x / week    PT Duration 8 weeks    PT Treatment/Interventions ADLs/Self Care Home Management;Aquatic  Therapy;Electrical Stimulation;Iontophoresis 4mg /ml Dexamethasone;Gait training;Therapeutic activities;Therapeutic exercise;Neuromuscular re-education;Manual techniques;Dry needling;Vasopneumatic Device;Spinal Manipulations;Joint Manipulations    PT Next Visit Plan Progressive knee/hip/core strengthening, prone quad (RF) stretching, balance    PT Home Exercise Plan [email or web address]    Consulted and Agree with Plan of Care Patient             Patient will benefit from skilled therapeutic intervention in order to improve the following deficits and impairments:  Abnormal gait, Decreased balance, Decreased endurance, Pain, Impaired flexibility, Decreased strength  Visit Diagnosis: Right knee pain, unspecified chronicity  Balance problem  Other abnormalities of gait and mobility  Muscle weakness  Low back pain, unspecified back pain laterality, unspecified chronicity, unspecified whether sciatica present  Pain in right hip  Pain in left hip  Left knee pain, unspecified chronicity     Problem List Patient Active Problem List   Diagnosis Date Noted   Annual physical exam 11/26/2016   PCP NOTES >>>>>>>>>>>>> 05/31/2016   Hypertension 02/11/2014   Diabetes mellitus without complication (HCC) 02/11/2014   Hyperlipidemia 02/11/2014   PPD positive 02/11/2014    13/05/2013 MS, PT 01/27/21 10:17 PM   [facility] Outpatient Rehabilitation [facility] [address] [place], [place], [place] Phone: [phone]   Fax:  [phone]  Name: Lisa Meyers MRN: Julious Payer Date of Birth: [birth year]

## 2021-02-03 ENCOUNTER — Ambulatory Visit: Payer: Medicare HMO

## 2021-02-05 ENCOUNTER — Ambulatory Visit: Payer: Medicare HMO | Admitting: Physical Therapy

## 2021-02-05 ENCOUNTER — Other Ambulatory Visit: Payer: Self-pay

## 2021-02-05 ENCOUNTER — Encounter: Payer: Self-pay | Admitting: Physical Therapy

## 2021-02-05 DIAGNOSIS — M25561 Pain in right knee: Secondary | ICD-10-CM | POA: Diagnosis not present

## 2021-02-05 DIAGNOSIS — R2689 Other abnormalities of gait and mobility: Secondary | ICD-10-CM

## 2021-02-05 DIAGNOSIS — M545 Low back pain, unspecified: Secondary | ICD-10-CM | POA: Diagnosis not present

## 2021-02-05 DIAGNOSIS — M6281 Muscle weakness (generalized): Secondary | ICD-10-CM | POA: Diagnosis not present

## 2021-02-05 DIAGNOSIS — M25552 Pain in left hip: Secondary | ICD-10-CM | POA: Diagnosis not present

## 2021-02-05 DIAGNOSIS — M25562 Pain in left knee: Secondary | ICD-10-CM | POA: Diagnosis not present

## 2021-02-05 DIAGNOSIS — M25551 Pain in right hip: Secondary | ICD-10-CM | POA: Diagnosis not present

## 2021-02-05 IMAGING — MG DIGITAL SCREENING BILAT W/ TOMO W/ CAD
8 series · 8 of 24 positions shown · non-contrast
Comparison: Previous exam(s).

CLINICAL DATA: Screening.

EXAM:
DIGITAL SCREENING BILATERAL MAMMOGRAM WITH TOMO AND CAD

[L MLO synth-2D]
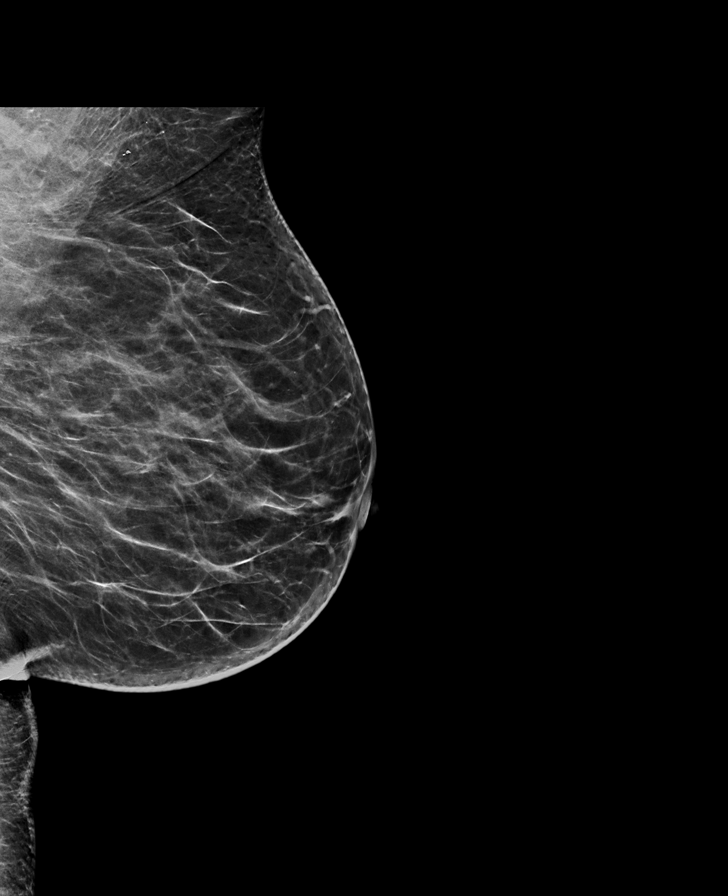

[R CC synth-2D]
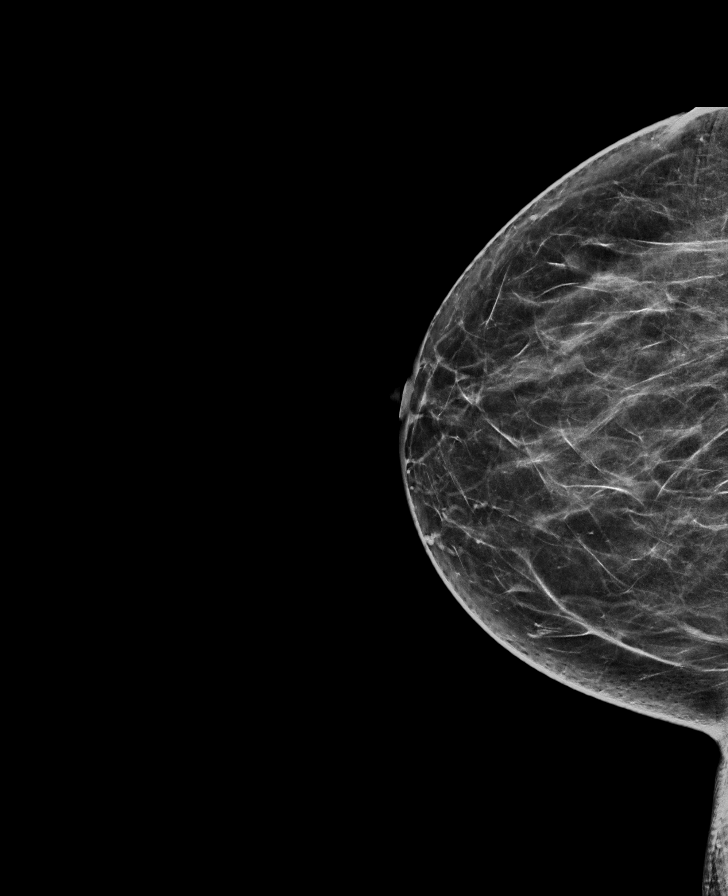

[R MLO synth-2D]
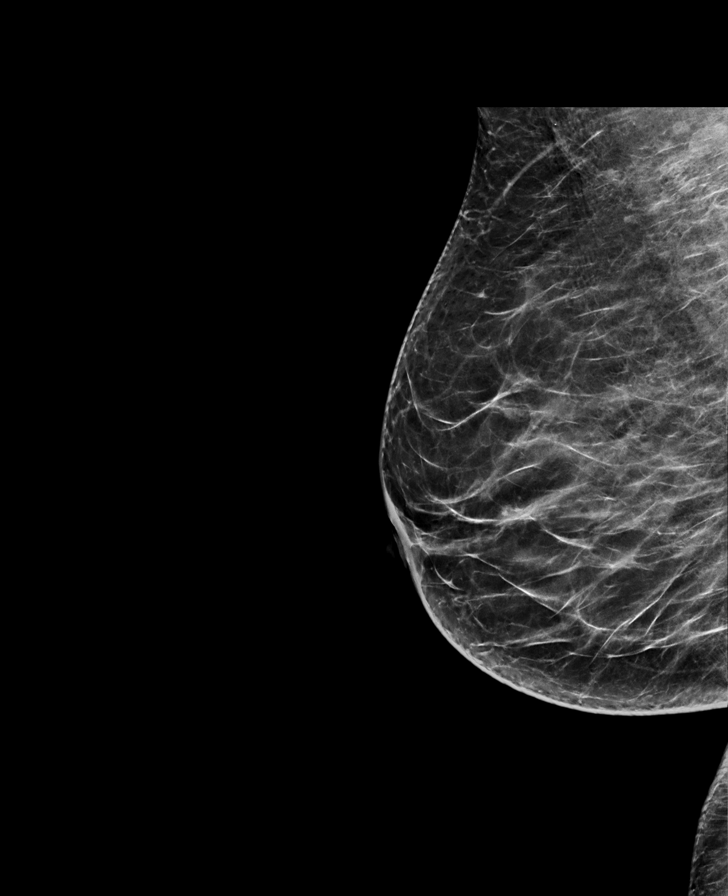

[L CC synth-2D]
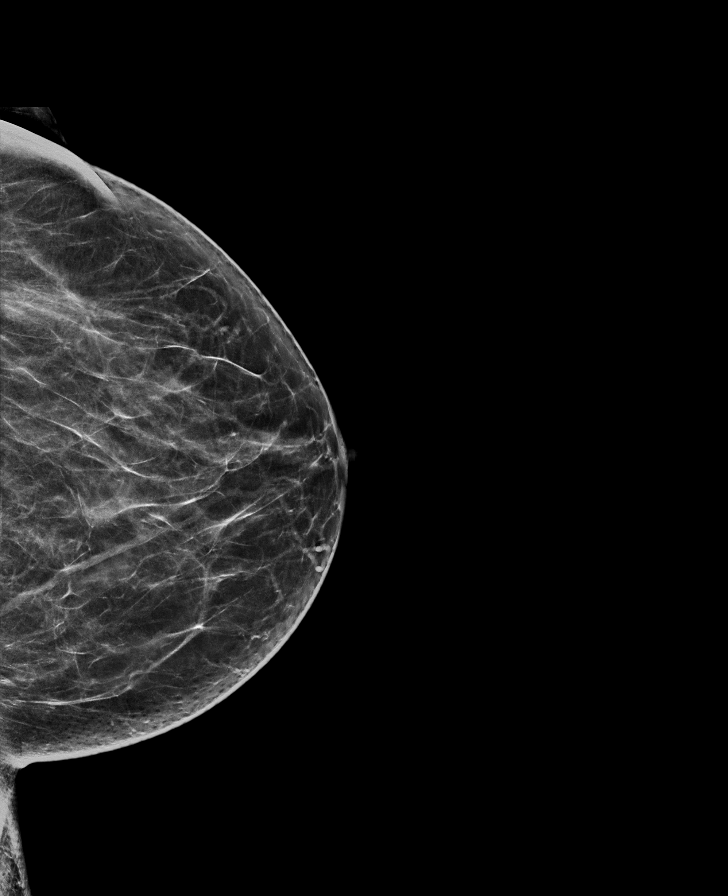

[L MLO tomo · tomo slice 35/70.0]
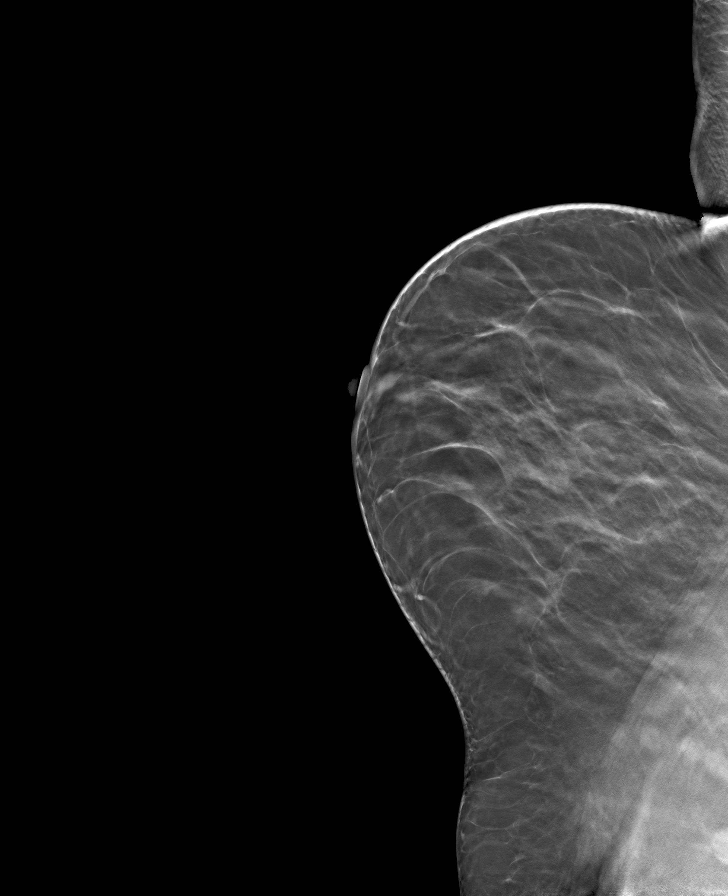

[L CC tomo · tomo slice 35/68.0]
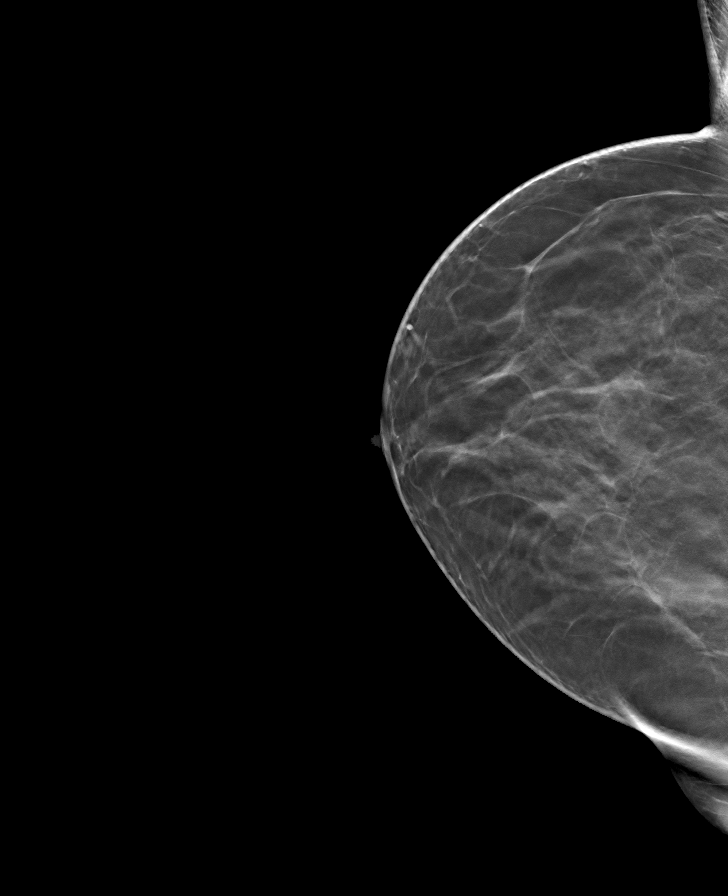

[R CC tomo · tomo slice 34/67.0]
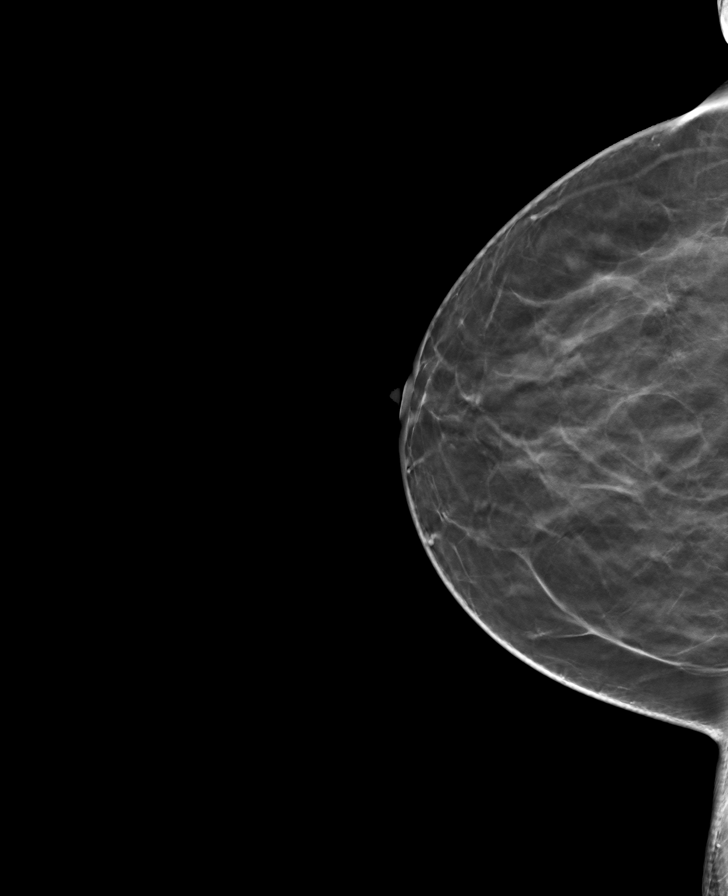

[R MLO tomo · tomo slice 35/70.0]
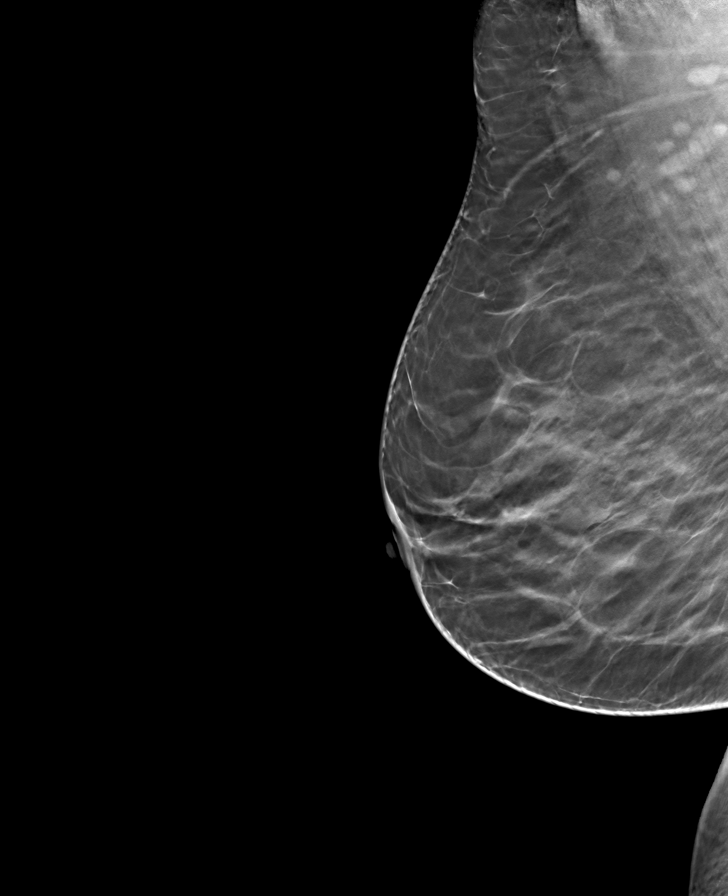

[8 of 24 positions shown; findings below may reference images not displayed]

ACR Breast Density Category c: The breast tissue is heterogeneously
dense, which may obscure small masses.
FINDINGS: There are no findings suspicious for malignancy. Images were
processed with CAD.
IMPRESSION: No mammographic evidence of malignancy. A result letter of this
screening mammogram will be mailed directly to the patient.

RECOMMENDATION:
Screening mammogram in one year. (Code:[id number]-[id number])

BI-RADS CATEGORY  1: Negative.

## 2021-02-05 NOTE — Therapy (Signed)
[facility] [address] [place], [place], [address] Phone: [phone]   Fax:  [phone]  Physical Therapy Treatment  Patient Details  Name: Lisa Meyers MRN: [id number] Date of Birth: 01-28-1956 Referring Provider (PT): Lisa Plump, MD   Encounter Date: 02/05/2021   PT End of Session - 02/05/21 1529     Visit Number 3    Number of Visits 16    Date for PT Re-Evaluation 03/12/21    Authorization Type MCR    Progress Note Due on Visit 10    PT Start Time 1530    PT Stop Time 1615    PT Time Calculation (min) 45 min    Activity Tolerance Patient tolerated treatment well    Behavior During Therapy [facility] for tasks assessed/performed             Past Medical History:  Diagnosis Date   Diabetes mellitus without complication (HCC)    Hyperlipidemia 02/11/2014   Hypertension    Neck pain    PPD positive dx in the 90s    Past Surgical History:  Procedure Laterality Date   ABDOMINAL HYSTERECTOMY     abdominal, no oophorectomy   BREAST CYST EXCISION     BREAST EXCISIONAL BIOPSY Right    CYST REMOVAL LEG  07-2013   inner L thigh, Dr Lisa Meyers, Lisa [facility]     There were no vitals filed for this visit.   Subjective Assessment - 02/05/21 1535     Subjective Pt reports that her knees have been feeling better.  She has been HEP compliant.  She rates her knee pain as 5/10 "moderate" today.  She reports she was sore after last visit.    Pain Onset More than a month ago    Pain Onset More than a month ago            [facility] Adult PT Treatment/Exercise:     Therapeutic Exercise: - nu-step L6 38m while taking subjective and planning session with patient - Bridging 1x10, 10 sec hold - Quad set, small towel under knee, Rt and LT, 1x10, 5 sec hold - 1/2 ROM SLR, with quad set prior, 2x5, 3 sec hold - SL clamshell - YTB - 3x10 - SAQ - 5# - 3x10 - medial patellar glide - Sit to stand - 2x10 from 52 cm  NRE: - Tandem - 30''  bouts - Rocker board DF/PF - 20x    PT Short Term Goals - 01/15/21 1553       PT SHORT TERM GOAL #1   Title Lisa Meyers will be >75% HEP compliant to improve carryover between sessions and facilitate independent management of condition    Target Date 02/05/21               PT Long Term Goals - 01/27/21 2210       PT LONG TERM GOAL #5   Title PT's FOTO score will improve to the predicted value of 67%    Baseline 63%    Status New                   Plan - 02/05/21 1614     Clinical Impression Statement Pt reports no increase in baseline pain following therapy  HEP was reviewed, but left unchanged    Overall, Lisa Meyers is progressing well with therapy.  Today we concentrated on quad strengthening and hip strengthening.  Pt continues shows significant quad weakness R>L, but is  able to perform 1/2 range SLR today.  Medial patellar glide during SAQ improves knee pain significantly, indicating need to strengthen VMO.  Pt will continue to benefit from skilled physical therapy to address remaining deficits and achieve listed goals.  Continue per POC.    Stability/Clinical Decision Making Stable/Uncomplicated    Rehab Potential Good    PT Frequency 2x / week    PT Duration 8 weeks    PT Treatment/Interventions ADLs/Self Care Home Management;Aquatic Therapy;Electrical Stimulation;Iontophoresis 4mg /ml Dexamethasone;Gait training;Therapeutic activities;Therapeutic exercise;Neuromuscular re-education;Manual techniques;Dry needling;Vasopneumatic Device;Spinal Manipulations;Joint Manipulations    PT Next Visit Plan Progressive knee/hip/core strengthening, prone quad (RF) stretching, balance    PT Home Exercise Plan [email or web address]    Consulted and Agree with Plan of Care Patient             Patient will benefit from skilled therapeutic intervention in order to improve the following deficits and impairments:  Abnormal gait, Decreased balance, Decreased endurance, Pain,  Impaired flexibility, Decreased strength  Visit Diagnosis: Right knee pain, unspecified chronicity  Balance problem  Other abnormalities of gait and mobility  Muscle weakness  Low back pain, unspecified back pain laterality, unspecified chronicity, unspecified whether sciatica present     Problem List Patient Active Problem List   Diagnosis Date Noted   Annual physical exam 11/26/2016   PCP NOTES >>>>>>>>>>>>> 05/31/2016   Hypertension 02/11/2014   Diabetes mellitus without complication (HCC) 02/11/2014   Hyperlipidemia 02/11/2014   PPD positive 02/11/2014    13/05/2013, PT 02/05/2021, 4:15 PM  [facility] [address] [place], [place], [place] Phone: [phone]   Fax:  [phone]  Name: Lisa Meyers MRN: Julious Payer Date of Birth: February 27, 1956

## 2021-02-10 ENCOUNTER — Encounter: Payer: Self-pay | Admitting: Physical Therapy

## 2021-02-10 ENCOUNTER — Ambulatory Visit: Payer: Medicare HMO | Attending: Internal Medicine | Admitting: Physical Therapy

## 2021-02-10 ENCOUNTER — Other Ambulatory Visit: Payer: Self-pay

## 2021-02-10 DIAGNOSIS — M25561 Pain in right knee: Secondary | ICD-10-CM | POA: Diagnosis not present

## 2021-02-10 DIAGNOSIS — M6281 Muscle weakness (generalized): Secondary | ICD-10-CM | POA: Diagnosis not present

## 2021-02-10 DIAGNOSIS — R2689 Other abnormalities of gait and mobility: Secondary | ICD-10-CM | POA: Diagnosis not present

## 2021-02-10 DIAGNOSIS — M545 Low back pain, unspecified: Secondary | ICD-10-CM | POA: Diagnosis not present

## 2021-02-10 NOTE — Therapy (Signed)
[place], [place], [address] Phone: [phone]   Fax:  [phone]  Physical Therapy Treatment  Patient Details  Name: Lisa Meyers MRN: [id number] Date of Birth: [birth year] Referring Provider (PT): Colon Branch, MD   Encounter Date: 02/10/2021   PT End of Session - 02/10/21 1617     Visit Number 4    Number of Visits 16    Date for PT Re-Evaluation 03/12/21    Authorization Type MCR    Progress Note Due on Visit 10    PT Start Time 0459    PT Stop Time 1700    PT Time Calculation (min) 45 min    Activity Tolerance Patient tolerated treatment well    Behavior During Therapy [facility] for tasks assessed/performed             Past Medical History:  Diagnosis Date   Diabetes mellitus without complication ([place])    Hyperlipidemia 02/11/2014   Hypertension    Neck pain    PPD positive dx in the 90s    Past Surgical History:  Procedure Laterality Date   ABDOMINAL HYSTERECTOMY     abdominal, no oophorectomy   BREAST CYST EXCISION     BREAST EXCISIONAL BIOPSY Right    CYST REMOVAL LEG  07-2013   inner L thigh, Dr Tommi Rumps, Baldo Ash     There were no vitals filed for this visit.   Subjective Assessment - 02/10/21 1621     Subjective Pt reports that she continues to see improvement in her knee pain, but she is having some thigh pain today.  She has been HEP compliant.  She rates her knee pain as 4/10 "moderate" today.  She reports she was sore after last visit.    Pain Onset More than a month ago    Pain Onset More than a month ago             [facility] Adult PT Treatment/Exercise:     Therapeutic Exercise: - nu-step L6 30m while taking subjective and planning session with patient - Bridging 1x10, 10 sec hold - SLR, with quad set prior, 2x5, 3 sec hold - SL clamshell - RTB - 3x10 - LAQ - 5# - 3x10 - medial patellar glide - Sit to stand - 2x10 from 52 cm (NT) - step up fwd and lat - 4'' step -  10x ea - mini squat x20     PT Short Term Goals - 02/10/21 1654       PT SHORT TERM GOAL #1   Title Lisa Meyers will be >75% HEP compliant to improve carryover between sessions and facilitate independent management of condition    Baseline 11/1: MET    Status Achieved    Target Date 02/05/21               PT Long Term Goals - 01/27/21 2210       PT LONG TERM GOAL #5   Title PT's FOTO score will improve to the predicted value of 67%    Baseline 63%    Status New                   Plan - 02/10/21 1646     Clinical Impression Statement Pt reports no increase in baseline pain following therapy  HEP was reviewed, but left unchanged    Overall, Lisa Meyers is progressing well with therapy.  Today we concentrated on lower extremity strengthening  and quad strengthening.  Pt is progressing as expected with progression to full ROM SLR, longer duration bridge, and full LAQ today.  Pt will continue to benefit from skilled physical therapy to address remaining deficits and achieve listed goals.  Continue per POC.    Stability/Clinical Decision Making Stable/Uncomplicated    Rehab Potential Good    PT Frequency 2x / week    PT Duration 8 weeks    PT Treatment/Interventions ADLs/Self Care Home Management;Aquatic Therapy;Electrical Stimulation;Iontophoresis 4mg /ml Dexamethasone;Gait training;Therapeutic activities;Therapeutic exercise;Neuromuscular re-education;Manual techniques;Dry needling;Vasopneumatic Device;Spinal Manipulations;Joint Manipulations    PT Next Visit Plan Progressive knee/hip/core strengthening, prone quad (RF) stretching, balance    PT Home Exercise Plan [email or web address]    Consulted and Agree with Plan of Care Patient             Patient will benefit from skilled therapeutic intervention in order to improve the following deficits and impairments:  Abnormal gait, Decreased balance, Decreased endurance, Pain, Impaired flexibility, Decreased  strength  Visit Diagnosis: Right knee pain, unspecified chronicity  Balance problem  Other abnormalities of gait and mobility  Muscle weakness  Low back pain, unspecified back pain laterality, unspecified chronicity, unspecified whether sciatica present     Problem List Patient Active Problem List   Diagnosis Date Noted   Annual physical exam 11/26/2016   PCP NOTES >>>>>>>>>>>>> 05/31/2016   Hypertension 02/11/2014   Diabetes mellitus without complication ([place]) 83/81/8403   Hyperlipidemia 02/11/2014   PPD positive 02/11/2014    Mathis Dad, PT 02/10/2021, 4:58 PM  [facility] [address] [place], [place], [address] Phone: [phone]   Fax:  [phone]  Name: Lisa Meyers MRN: [id number] Date of Birth: 1955/08/11

## 2021-02-12 ENCOUNTER — Telehealth: Payer: Self-pay

## 2021-02-12 ENCOUNTER — Ambulatory Visit: Payer: Medicare HMO

## 2021-02-12 ENCOUNTER — Other Ambulatory Visit: Payer: Self-pay

## 2021-02-12 DIAGNOSIS — R2689 Other abnormalities of gait and mobility: Secondary | ICD-10-CM

## 2021-02-12 DIAGNOSIS — M25561 Pain in right knee: Secondary | ICD-10-CM | POA: Diagnosis not present

## 2021-02-12 DIAGNOSIS — M6281 Muscle weakness (generalized): Secondary | ICD-10-CM | POA: Diagnosis not present

## 2021-02-12 DIAGNOSIS — M545 Low back pain, unspecified: Secondary | ICD-10-CM

## 2021-02-12 NOTE — Therapy (Signed)
[place], [place], [address] Phone: [phone]   Fax:  [phone]  Physical Therapy Treatment  Patient Details  Name: Lisa Meyers MRN: [id number] Date of Birth: 26-Nov-1955 Referring Provider (PT): Colon Branch, MD   Encounter Date: 02/12/2021   PT End of Session - 02/12/21 1637     Visit Number 5    Number of Visits 16    Date for PT Re-Evaluation 03/12/21    Authorization Type MCR    Progress Note Due on Visit 10    PT Start Time 5625   25 mins late for appt   PT Stop Time 6389    PT Time Calculation (min) 38 min    Activity Tolerance Patient tolerated treatment well    Behavior During Therapy [facility] for tasks assessed/performed             Past Medical History:  Diagnosis Date   Diabetes mellitus without complication ([place])    Hyperlipidemia 02/11/2014   Hypertension    Neck pain    PPD positive dx in the 90s    Past Surgical History:  Procedure Laterality Date   ABDOMINAL HYSTERECTOMY     abdominal, no oophorectomy   BREAST CYST EXCISION     BREAST EXCISIONAL BIOPSY Right    CYST REMOVAL LEG  07-2013   inner L thigh, Dr Tommi Rumps, Baldo Ash     There were no vitals filed for this visit.   Subjective Assessment - 02/12/21 1621     Subjective Pt reports her low back has been bothering from lifting at work. Pt states her knees are feeling much better    Currently in Pain? Yes    Pain Score 7     Pain Location Knee    Pain Orientation Right    Pain Descriptors / Indicators Aching    Pain Type Chronic pain    Pain Onset More than a month ago    Pain Frequency Intermittent    Pain Score 7    Pain Orientation Left    Pain Descriptors / Indicators Aching    Pain Type Chronic pain    Pain Onset More than a month ago    Pain Frequency Intermittent              [facility] Adult PT Treatment/Exercise:     Therapeutic Exercise: - nu-step L6 51mwhile taking subjective and planning  session with patient - Bridging 1x10, 10 sec hold - SLR, with quad set prior, 2x5, 3 sec hold - SL clamshell - RTB - 3x10 - LAQ - 5# - 3x10 - medial patellar glide - Sit to stand - 2x10 from 52 cm (NT) - step up fwd and lat - 4'' step - 10x ea - mini squat x20 - Thomas stretch, L and R, 2 x 30"  Patient Education: Verbal cueing for more upright posture while walking. With cueing pt was able to correct.  Not completed this session - mini squat x20                         PT Short Term Goals - 02/10/21 1654       PT SHORT TERM GOAL #1   Title Samyukta K Dingee will be >75% HEP compliant to improve carryover between sessions and facilitate independent management of condition    Baseline 11/1: MET    Status Achieved    Target Date 02/05/21  PT Long Term Goals - 01/27/21 2210       PT LONG TERM GOAL #5   Title PT's FOTO score will improve to the predicted value of 67%    Baseline 63%    Status New                   Plan - 02/12/21 1632     Clinical Impression Statement PT was completed to address knee/LE strengthening with both OKC and CKC exs. While pt's subjective rating of knee pain is at a higher level of 7/10 bilat, pt does remark that her knee pain is much better since starting PT. Pt walks with a forward flexed trunk posture which was addressed per the completion of the [employer] and verbal cueing to stand more upright. The Thomas stretch was not added to the pt's HEP today with more practice needed for pt to complete Ind[place]. With verbal cueing, the pt was able to correct her forward trunk posture during walking. Pt was encouraged to stand upright on a consistent basis.  At the end of session, the pt's next 4 appts were reviewed and a written schedule was provided. Pt voiced understanding of her schedule    Stability/Clinical Decision Making Stable/Uncomplicated    Clinical Decision Making Low    Rehab Potential Good    PT  Frequency 2x / week    PT Duration 8 weeks    PT Treatment/Interventions ADLs/Self Care Home Management;Aquatic Therapy;Electrical Stimulation;Iontophoresis 43m/ml Dexamethasone;Gait training;Therapeutic activities;Therapeutic exercise;Neuromuscular re-education;Manual techniques;Dry needling;Vasopneumatic Device;Spinal Manipulations;Joint Manipulations    PT Next Visit Plan Progressive knee/hip/core strengthening, prone quad (RF) stretching, balance    PT Home Exercise Plan [email or web address]    Consulted and Agree with Plan of Care Patient             Patient will benefit from skilled therapeutic intervention in order to improve the following deficits and impairments:  Abnormal gait, Decreased balance, Decreased endurance, Pain, Impaired flexibility, Decreased strength  Visit Diagnosis: Right knee pain, unspecified chronicity  Low back pain, unspecified back pain laterality, unspecified chronicity, unspecified whether sciatica present  Balance problem  Muscle weakness     Problem List Patient Active Problem List   Diagnosis Date Noted   Annual physical exam 11/26/2016   PCP NOTES >>>>>>>>>>>>> 05/31/2016   Hypertension 02/11/2014   Diabetes mellitus without complication ([place] 175/64/3329  Hyperlipidemia 02/11/2014   PPD positive 02/11/2014    AGar PontoMS, PT 02/12/21 5:26 PM   [facility] [address]Phone: [phone]  Fax:  [phone] Name: MFREDI HURTADOMRN: [id number] of Birth: 105-20-1957

## 2021-02-17 ENCOUNTER — Other Ambulatory Visit: Payer: Self-pay

## 2021-02-17 ENCOUNTER — Encounter: Payer: Self-pay | Admitting: Physical Therapy

## 2021-02-17 ENCOUNTER — Ambulatory Visit: Payer: Medicare HMO | Admitting: Physical Therapy

## 2021-02-17 DIAGNOSIS — M6281 Muscle weakness (generalized): Secondary | ICD-10-CM

## 2021-02-17 DIAGNOSIS — R2689 Other abnormalities of gait and mobility: Secondary | ICD-10-CM | POA: Diagnosis not present

## 2021-02-17 DIAGNOSIS — M545 Low back pain, unspecified: Secondary | ICD-10-CM

## 2021-02-17 DIAGNOSIS — M25561 Pain in right knee: Secondary | ICD-10-CM | POA: Diagnosis not present

## 2021-02-17 NOTE — Therapy (Signed)
[place], [place], [address] Phone: [phone]   Fax:  [phone]  Physical Therapy Treatment  Patient Details  Name: Lisa Meyers MRN: [id number] Date of Birth: 02-23-1956 Referring Provider (PT): Colon Branch, MD   Encounter Date: 02/17/2021   PT End of Session - 02/17/21 1611     Visit Number 6    Number of Visits 16    Date for PT Re-Evaluation 03/12/21    Authorization Type MCR    Progress Note Due on Visit 10    PT Start Time 2355    PT Stop Time 1655    PT Time Calculation (min) 40 min    Activity Tolerance Patient tolerated treatment well    Behavior During Therapy [facility] for tasks assessed/performed             Past Medical History:  Diagnosis Date   Diabetes mellitus without complication ([place])    Hyperlipidemia 02/11/2014   Hypertension    Neck pain    PPD positive dx in the 90s    Past Surgical History:  Procedure Laterality Date   ABDOMINAL HYSTERECTOMY     abdominal, no oophorectomy   BREAST CYST EXCISION     BREAST EXCISIONAL BIOPSY Right    CYST REMOVAL LEG  07-2013   inner L thigh, Dr Tommi Rumps, Baldo Ash     There were no vitals filed for this visit.   Subjective Assessment - 02/17/21 1619     Subjective Pt reports that her knees are imrpoving, but she is now having some pain in her R hip.  She rates her knee pain as 4/10 "moderate" today.  She has some minor soreness after her visits    Pain Onset More than a month ago    Pain Onset More than a month ago              Therapeutic Exercise: - nu-step L6 64m while taking subjective and planning session with patient - SLR, with quad set prior, 2x10 1.5# - SL clamshell - GTB - 3x10 - LAQ - 7.5# - 3x10 - medial patellar glide - Sit to stand - 3x10 from 52 cm -10# - bridge with small march - 2x10 - step up fwd and lat - 6'' step - 10x ea - HS curl - bil - 25#    PT Short Term Goals - 02/10/21 1654       PT  SHORT TERM GOAL #1   Title Malli K Thorman will be >75% HEP compliant to improve carryover between sessions and facilitate independent management of condition    Baseline 11/1: MET    Status Achieved    Target Date 02/05/21               PT Long Term Goals - 01/27/21 2210       PT LONG TERM GOAL #5   Title PT's FOTO score will improve to the predicted value of 67%    Baseline 63%    Status New                   Plan - 02/17/21 1633     Clinical Impression Statement Overall, Mayara is progressing well with therapy.  Pt reports no increase in baseline pain following therapy.  Today we concentrated on core strengthening, knee strengthening, and hip strengthening.  Pt continues to progress knee and hip strengthening as expected with concurrent reduction in pain.  She must be cued  for form and pacing throughout.  Pt will continue to benefit from skilled physical therapy to address remaining deficits and achieve listed goals.  Continue per POC.    Stability/Clinical Decision Making Stable/Uncomplicated    Rehab Potential Good    PT Frequency 2x / week    PT Duration 8 weeks    PT Treatment/Interventions ADLs/Self Care Home Management;Aquatic Therapy;Electrical Stimulation;Iontophoresis 4mg /ml Dexamethasone;Gait training;Therapeutic activities;Therapeutic exercise;Neuromuscular re-education;Manual techniques;Dry needling;Vasopneumatic Device;Spinal Manipulations;Joint Manipulations    PT Next Visit Plan Progressive knee/hip/core strengthening, prone quad (RF) stretching, balance    PT Home Exercise Plan [email or web address]    Consulted and Agree with Plan of Care Patient             Patient will benefit from skilled therapeutic intervention in order to improve the following deficits and impairments:  Abnormal gait, Decreased balance, Decreased endurance, Pain, Impaired flexibility, Decreased strength  Visit Diagnosis: Right knee pain, unspecified chronicity  Low back pain,  unspecified back pain laterality, unspecified chronicity, unspecified whether sciatica present  Balance problem  Muscle weakness  Other abnormalities of gait and mobility     Problem List Patient Active Problem List   Diagnosis Date Noted   Annual physical exam 11/26/2016   PCP NOTES >>>>>>>>>>>>> 05/31/2016   Hypertension 02/11/2014   Diabetes mellitus without complication ([place]) 39/43/2003   Hyperlipidemia 02/11/2014   PPD positive 02/11/2014    Mathis Dad, PT 02/17/2021, 4:43 PM  [facility] [address] [place], [place], [address] Phone: [phone]   Fax:  [phone]  Name: Lisa Meyers MRN: [id number] Date of Birth: 11-12-1955

## 2021-02-19 ENCOUNTER — Encounter: Payer: Self-pay | Admitting: Physical Therapy

## 2021-02-19 ENCOUNTER — Other Ambulatory Visit: Payer: Self-pay

## 2021-02-19 ENCOUNTER — Ambulatory Visit: Payer: Medicare HMO | Admitting: Physical Therapy

## 2021-02-19 DIAGNOSIS — M545 Low back pain, unspecified: Secondary | ICD-10-CM

## 2021-02-19 DIAGNOSIS — M25561 Pain in right knee: Secondary | ICD-10-CM | POA: Diagnosis not present

## 2021-02-19 DIAGNOSIS — M6281 Muscle weakness (generalized): Secondary | ICD-10-CM

## 2021-02-19 DIAGNOSIS — R2689 Other abnormalities of gait and mobility: Secondary | ICD-10-CM

## 2021-02-19 NOTE — Therapy (Signed)
[place], [place], [address] Phone: [phone]   Fax:  [phone]  Physical Therapy Treatment  Patient Details  Name: Lisa Meyers MRN: [id number] Date of Birth: [birth year] Referring Provider (PT): Colon Branch, MD   Encounter Date: 02/19/2021   PT End of Session - 02/19/21 1615     Visit Number 7    Number of Visits 16    Date for PT Re-Evaluation 03/12/21    Authorization Type MCR    Progress Note Due on Visit 10    PT Start Time 6606    PT Stop Time 1655    PT Time Calculation (min) 40 min    Activity Tolerance Patient tolerated treatment well    Behavior During Therapy [facility] for tasks assessed/performed             Past Medical History:  Diagnosis Date   Diabetes mellitus without complication ([place])    Hyperlipidemia 02/11/2014   Hypertension    Neck pain    PPD positive dx in the 90s    Past Surgical History:  Procedure Laterality Date   ABDOMINAL HYSTERECTOMY     abdominal, no oophorectomy   BREAST CYST EXCISION     BREAST EXCISIONAL BIOPSY Right    CYST REMOVAL LEG  07-2013   inner L thigh, Dr Tommi Rumps, Baldo Ash     There were no vitals filed for this visit.   Subjective Assessment - 02/19/21 1618     Subjective Pt reports that her knees are continuing to improve and she has "very little pain" she cannot describe it with a number.  Her low back causes her the most issue at this point.    Pain Onset More than a month ago    Pain Onset More than a month ago             Therapeutic Exercise: - nu-step L5 6m no UE support while taking subjective and planning session with patient - SLR, with quad set prior, 2x10 1.5# - SL clamshell - Blue TB - 2x10 - Wall squat to ~60* 4x10 - bil knee ext 10# - 3x10 - Sit to stand - 3x10 from blue chair -10# - bridge with small march - 2x10 - step up fwd and lat - 6'' step - 2x10 ea - HS curl - bil - 25# - 3x10    PT Short Term Goals  - 02/10/21 1654       PT SHORT TERM GOAL #1   Title Lisa Meyers will be >75% HEP compliant to improve carryover between sessions and facilitate independent management of condition    Baseline 11/1: MET    Status Achieved    Target Date 02/05/21               PT Long Term Goals - 01/27/21 2210       PT LONG TERM GOAL #5   Title PT's FOTO score will improve to the predicted value of 67%    Baseline 63%    Status New                   Plan - 02/19/21 1636     Clinical Impression Statement Overall, Lisa Meyers is progressing well with therapy.  Pt reports no increase in baseline pain following therapy.  Today we concentrated on lower extremity strengthening, knee strengthening, and hip strengthening.  Pt continues to progress load with knee exercises and was able to progress to  wall squats and knee ext machine today.  Pt will continue to benefit from skilled physical therapy to address remaining deficits and achieve listed goals.  Continue per POC.    Stability/Clinical Decision Making Stable/Uncomplicated    Rehab Potential Good    PT Frequency 2x / week    PT Duration 8 weeks    PT Treatment/Interventions ADLs/Self Care Home Management;Aquatic Therapy;Electrical Stimulation;Iontophoresis 4mg /ml Dexamethasone;Gait training;Therapeutic activities;Therapeutic exercise;Neuromuscular re-education;Manual techniques;Dry needling;Vasopneumatic Device;Spinal Manipulations;Joint Manipulations    PT Next Visit Plan Progressive knee/hip/core strengthening, prone quad (RF) stretching, balance    PT Home Exercise Plan [id number]    Consulted and Agree with Plan of Care Patient             Patient will benefit from skilled therapeutic intervention in order to improve the following deficits and impairments:  Abnormal gait, Decreased balance, Decreased endurance, Pain, Impaired flexibility, Decreased strength  Visit Diagnosis: Right knee pain, unspecified chronicity  Low back pain,  unspecified back pain laterality, unspecified chronicity, unspecified whether sciatica present  Balance problem  Muscle weakness  Other abnormalities of gait and mobility     Problem List Patient Active Problem List   Diagnosis Date Noted   Annual physical exam 11/26/2016   PCP NOTES >>>>>>>>>>>>> 05/31/2016   Hypertension 02/11/2014   Diabetes mellitus without complication ([place]) 05/03/2409   Hyperlipidemia 02/11/2014   PPD positive 02/11/2014    Mathis Dad, PT 02/19/2021, 4:57 PM  [place] [facility] [address] [place], [place], [address] Phone: [phone]   Fax:  [phone]  Name: Lisa Meyers MRN: [id number] Date of Birth: [birth year]

## 2021-02-23 ENCOUNTER — Ambulatory Visit (INDEPENDENT_AMBULATORY_CARE_PROVIDER_SITE_OTHER): Payer: Medicare HMO | Admitting: Family Medicine

## 2021-02-23 ENCOUNTER — Other Ambulatory Visit: Payer: Self-pay

## 2021-02-23 VITALS — BP 120/70 | HR 77 | Temp 97.7°F | Resp 18 | Wt 186.2 lb

## 2021-02-23 DIAGNOSIS — E119 Type 2 diabetes mellitus without complications: Secondary | ICD-10-CM | POA: Diagnosis not present

## 2021-02-23 DIAGNOSIS — R5383 Other fatigue: Secondary | ICD-10-CM | POA: Diagnosis not present

## 2021-02-23 DIAGNOSIS — Z711 Person with feared health complaint in whom no diagnosis is made: Secondary | ICD-10-CM | POA: Diagnosis not present

## 2021-02-23 DIAGNOSIS — I10 Essential (primary) hypertension: Secondary | ICD-10-CM | POA: Diagnosis not present

## 2021-02-23 DIAGNOSIS — E785 Hyperlipidemia, unspecified: Secondary | ICD-10-CM

## 2021-02-23 MED ORDER — METFORMIN HCL 1000 MG PO TABS: 1000.0000 mg | ORAL_TABLET | Freq: Two times a day (BID) | ORAL | 2 refills | Status: DC

## 2021-02-23 MED ORDER — LOVASTATIN 20 MG PO TABS: 20.0000 mg | ORAL_TABLET | Freq: Every day | ORAL | 3 refills | Status: AC

## 2021-02-23 MED ORDER — HYDROCHLOROTHIAZIDE 25 MG PO TABS: 25.0000 mg | ORAL_TABLET | Freq: Every day | ORAL | 3 refills | Status: DC

## 2021-02-23 NOTE — Progress Notes (Addendum)
[place] at [facility] [address] [place], [place] [address] [phone]  Date:  02/23/2021   Name:  Lisa Meyers   DOB:  02/14/1956   MRN:  [id number]  PCP:  Colon Branch, MD    Chief Complaint: ear discomfort (On Right side- pt associates this with her Lisinopril. She says no allergy tx has helped.She also asks for her 3 month labs to be ordered.)   History of Present Illness:  Lisa Meyers is a 65 y.o. very pleasant female patient who presents with the following:  Primary pt of Dr Larose Kells, here today with concern of medication refills, labs and an ear problem  History of HTN and DM, hyperlipidemia I have not seen her myself in the past   Most recent labs done in April   She feels like her ears are "closing up" when she takes her lisinopril- or can occur other times as well.  It is not painful or particularly itchy.  Patient notes she has asked Dr. Larose Kells about this in the past and he did not know what was causing it.  This has gone on for some time  Lab Results  Component Value Date   HGBA1C 6.7 (H) 08/05/2020      Patient Active Problem List   Diagnosis Date Noted   Annual physical exam 11/26/2016   PCP NOTES >>>>>>>>>>>>> 05/31/2016   Hypertension 02/11/2014   Diabetes mellitus without complication ([place]) 23/53/6144   Hyperlipidemia 02/11/2014   PPD positive 02/11/2014    Past Medical History:  Diagnosis Date   Diabetes mellitus without complication ([place])    Hyperlipidemia 02/11/2014   Hypertension    Neck pain    PPD positive dx in the 90s    Past Surgical History:  Procedure Laterality Date   ABDOMINAL HYSTERECTOMY     abdominal, no oophorectomy   BREAST CYST EXCISION     BREAST EXCISIONAL BIOPSY Right    CYST REMOVAL LEG  07-2013   inner L thigh, Dr Tommi Rumps, [place]     Social History   Tobacco Use   Smoking status: Never   Smokeless tobacco: Never  Substance Use Topics   Alcohol use: Yes     Alcohol/week: 0.0 standard drinks    Comment: wine, rare    Drug use: No    Family History  Problem Relation Age of Onset   Hypertension Mother    Hypertension Father    Heart disease Father    Diabetes Maternal Aunt    Colon cancer Neg Hx    Breast cancer Neg Hx    CAD Neg Hx     No Known Allergies  Medication list has been reviewed and updated.  Current Outpatient Medications on File Prior to Visit  Medication Sig Dispense Refill   Ascorbic Acid (VITAMIN C) 100 MG tablet Take 100 mg by mouth daily.     blood glucose meter kit and supplies Dispense based on patient and insurance preference. Check blood sugar once daily. 1 each 0   glucose blood (ONETOUCH ULTRA) test strip Check blood sugars once daily 100 strip 12   lisinopril (ZESTRIL) 20 MG tablet Take 1 tablet (20 mg total) by mouth daily. 90 tablet 3   Multiple Vitamins-Minerals (DAILY MULTIVITAMIN PO) Take by mouth.     VITAMIN D, ERGOCALCIFEROL, PO Take by mouth.     No current facility-administered medications on file prior to visit.    Review of  Systems:  As per HPI- otherwise negative.   Physical Examination: Vitals:   02/23/21 1603  BP: 120/70  Pulse: 77  Resp: 18  Temp: 97.7 F (36.5 C)  SpO2: 100%   Vitals:   02/23/21 1603  Weight: 186 lb 3.2 oz (84.5 kg)   Body mass index is 29.16 kg/m. Ideal Body Weight:    GEN: no acute distress.  Overweight, otherwise looks well HEENT: Atraumatic, Normocephalic.  Both ear canals and TMs are normal, no particular earwax is present Ears and Nose: No external deformity. CV: RRR, No M/G/R. No JVD. No thrill. No extra heart sounds. PULM: CTA B, no wheezes, crackles, rhonchi. No retractions. No resp. distress. No accessory muscle use. EXTR: No c/c/e PSYCH: Normally interactive. Conversant.    Assessment and Plan: Diabetes mellitus without complication ([place]) - Plan: Comprehensive metabolic panel, Hemoglobin A1c, metFORMIN (GLUCOPHAGE) 1000 MG  tablet  Hyperlipidemia, unspecified hyperlipidemia type - Plan: Lipid panel, lovastatin (MEVACOR) 20 MG tablet  Essential hypertension - Plan: CBC, TSH, hydrochlorothiazide (HYDRODIURIL) 25 MG tablet  Fatigue, unspecified type - Plan: TSH, VITAMIN D 25 Hydroxy (Vit-D Deficiency, Fractures)  Concern about ear disease without diagnosis  Patient seen today for follow-up.  Labs are pending as above.  Medications are refilled. I advised her I also do not know the cause of her ear problem.  However, we do not think it is anything dangerous  Signed Lamar Blinks, MD  Received labs as below, message to patient  Results for orders placed or performed in visit on 02/23/21  CBC  Result Value Ref Range   WBC 3.0 (L) 4.0 - 10.5 K/uL   RBC 3.95 3.87 - 5.11 Mil/uL   Platelets 193.0 150.0 - 400.0 K/uL   Hemoglobin 11.9 (L) 12.0 - 15.0 g/dL   HCT 36.3 36.0 - 46.0 %   MCV 91.8 78.0 - 100.0 fl   MCHC 32.9 30.0 - 36.0 g/dL   RDW 12.8 11.5 - 15.5 %  Comprehensive metabolic panel  Result Value Ref Range   Sodium 141 135 - 145 mEq/L   Potassium 3.5 3.5 - 5.1 mEq/L   Chloride 105 96 - 112 mEq/L   CO2 28 19 - 32 mEq/L   Glucose, Bld 101 (H) 70 - 99 mg/dL   BUN 12 6 - 23 mg/dL   Creatinine, Ser 0.82 0.40 - 1.20 mg/dL   Total Bilirubin 0.4 0.2 - 1.2 mg/dL   Alkaline Phosphatase 60 39 - 117 U/L   AST 15 0 - 37 U/L   ALT 11 0 - 35 U/L   Total Protein 7.3 6.0 - 8.3 g/dL   Albumin 4.1 3.5 - 5.2 g/dL   GFR 73.81 >60.00 mL/min   Calcium 9.8 8.4 - 10.5 mg/dL  Hemoglobin A1c  Result Value Ref Range   Hgb A1c MFr Bld 6.9 (H) 4.6 - 6.5 %  Lipid panel  Result Value Ref Range   Cholesterol 173 0 - 200 mg/dL   Triglycerides 59.0 0.0 - 149.0 mg/dL   HDL 69.60 >39.00 mg/dL   VLDL 11.8 0.0 - 40.0 mg/dL   LDL Cholesterol 91 0 - 99 mg/dL   Total CHOL/HDL Ratio 2    NonHDL 103.07   TSH  Result Value Ref Range   TSH 1.03 0.35 - 5.50 uIU/mL  VITAMIN D 25 Hydroxy (Vit-D Deficiency, Fractures)  Result  Value Ref Range   VITD 37.99 30.00 - 100.00 ng/mL

## 2021-02-23 NOTE — Patient Instructions (Signed)
Good to see you today- I am sorry that I am not sure what is causing your ear symptoms!    We will be in touch with your labs asap

## 2021-02-24 ENCOUNTER — Ambulatory Visit: Payer: Medicare HMO | Admitting: Physical Therapy

## 2021-02-24 ENCOUNTER — Encounter: Payer: Self-pay | Admitting: Physical Therapy

## 2021-02-24 ENCOUNTER — Encounter: Payer: Self-pay | Admitting: Family Medicine

## 2021-02-24 DIAGNOSIS — M6281 Muscle weakness (generalized): Secondary | ICD-10-CM | POA: Diagnosis not present

## 2021-02-24 DIAGNOSIS — M25561 Pain in right knee: Secondary | ICD-10-CM | POA: Diagnosis not present

## 2021-02-24 DIAGNOSIS — M545 Low back pain, unspecified: Secondary | ICD-10-CM

## 2021-02-24 DIAGNOSIS — R2689 Other abnormalities of gait and mobility: Secondary | ICD-10-CM

## 2021-02-24 LAB — CBC
HCT: 36.3 % (ref 36.0–46.0)
Hemoglobin: 11.9 g/dL — ABNORMAL LOW (ref 12.0–15.0)
MCHC: 32.9 g/dL (ref 30.0–36.0)
MCV: 91.8 fl (ref 78.0–100.0)
Platelets: 193 10*3/uL (ref 150.0–400.0)
RBC: 3.95 Mil/uL (ref 3.87–5.11)
RDW: 12.8 % (ref 11.5–15.5)
WBC: 3 10*3/uL — ABNORMAL LOW (ref 4.0–10.5)

## 2021-02-24 LAB — COMPREHENSIVE METABOLIC PANEL
ALT: 11 U/L (ref 0–35)
AST: 15 U/L (ref 0–37)
Albumin: 4.1 g/dL (ref 3.5–5.2)
Alkaline Phosphatase: 60 U/L (ref 39–117)
BUN: 12 mg/dL (ref 6–23)
CO2: 28 mEq/L (ref 19–32)
Calcium: 9.8 mg/dL (ref 8.4–10.5)
Chloride: 105 mEq/L (ref 96–112)
Creatinine, Ser: 0.82 mg/dL (ref 0.40–1.20)
GFR: 73.81 mL/min (ref >60.00)
Glucose, Bld: 101 mg/dL — ABNORMAL HIGH (ref 70–99)
Potassium: 3.5 mEq/L (ref 3.5–5.1)
Sodium: 141 mEq/L (ref 135–145)
Total Bilirubin: 0.4 mg/dL (ref 0.2–1.2)
Total Protein: 7.3 g/dL (ref 6.0–8.3)

## 2021-02-24 LAB — LIPID PANEL
Cholesterol: 173 mg/dL (ref 0–200)
HDL: 69.6 mg/dL (ref >39.00)
LDL Cholesterol: 91 mg/dL (ref 0–99)
NonHDL: 103.07
Total CHOL/HDL Ratio: 2
Triglycerides: 59 mg/dL (ref 0.0–149.0)
VLDL: 11.8 mg/dL (ref 0.0–40.0)

## 2021-02-24 LAB — VITAMIN D 25 HYDROXY (VIT D DEFICIENCY, FRACTURES): VITD: 37.99 ng/mL (ref 30.00–100.00)

## 2021-02-24 LAB — HEMOGLOBIN A1C: Hgb A1c MFr Bld: 6.9 % — ABNORMAL HIGH (ref 4.6–6.5)

## 2021-02-24 LAB — TSH: TSH: 1.03 u[IU]/mL (ref 0.35–5.50)

## 2021-02-24 NOTE — Therapy (Signed)
[place], [place], [address] Phone: [phone]   Fax:  [phone]  Physical Therapy Treatment  Patient Details  Name: Lisa Meyers MRN: [id number] Date of Birth: Jul 11, 1955 Referring Provider (PT): Colon Branch, MD   Encounter Date: 02/24/2021   PT End of Session - 02/24/21 1524     Visit Number 8    Number of Visits 16    Date for PT Re-Evaluation 03/12/21    Authorization Type MCR    Progress Note Due on Visit 10    PT Start Time 6004    PT Stop Time 5997    PT Time Calculation (min) 45 min    Activity Tolerance Patient tolerated treatment well    Behavior During Therapy [facility] for tasks assessed/performed             Past Medical History:  Diagnosis Date   Diabetes mellitus without complication ([place])    Hyperlipidemia 02/11/2014   Hypertension    Neck pain    PPD positive dx in the 90s    Past Surgical History:  Procedure Laterality Date   ABDOMINAL HYSTERECTOMY     abdominal, no oophorectomy   BREAST CYST EXCISION     BREAST EXCISIONAL BIOPSY Right    CYST REMOVAL LEG  07-2013   inner L thigh, Dr Tommi Rumps, [place]     There were no vitals filed for this visit.   Subjective Assessment - 02/24/21 1525     Subjective Pt reports that her knees are aching a bit more today d/t the cold rainy weather.  She reports 3/10 knee pain    Pain Onset More than a month ago    Pain Onset More than a month ago             Therapeutic Exercise: - nu-step L5 27mno UE support while taking subjective and planning session with patient - Leg press - 55# - 3x10 - SLR, with quad set prior, 2x10 3# - SL clamshell - black TB - 2x10 - Wall squat to ~60* 4x10 (not today) - bil knee ext 25# - 3x10 - HS curl - bil - 25# - 3x10 - Sit to stand - 2x 5# - step up fwd and lat - 6'' step - x10 ea - Standing hip abduction machine - 3x10 - 25#    PT Short Term Goals - 02/10/21 1654       PT SHORT  TERM GOAL #1   Title Celina K Sutch will be >75% HEP compliant to improve carryover between sessions and facilitate independent management of condition    Baseline 11/1: MET    Status Achieved    Target Date 02/05/21               PT Long Term Goals - 01/27/21 2210       PT LONG TERM GOAL #5   Title PT's FOTO score will improve to the predicted value of 67%    Baseline 63%    Status New                   Plan - 02/24/21 1546     Clinical Impression Statement Overall, Yamili is progressing well with therapy.  Pt reports no increase in baseline pain following therapy.  Today we concentrated on knee strengthening and hip strengthening.  Pt continues to progress knee ext strength.  We moved to more machine based exercise today in anticipation for D/C to  gym.  Pt will continue to benefit from skilled physical therapy to address remaining deficits and achieve listed goals.  Continue per POC.    Stability/Clinical Decision Making Stable/Uncomplicated    Rehab Potential Good    PT Frequency 2x / week    PT Duration 8 weeks    PT Treatment/Interventions ADLs/Self Care Home Management;Aquatic Therapy;Electrical Stimulation;Iontophoresis 64m/ml Dexamethasone;Gait training;Therapeutic activities;Therapeutic exercise;Neuromuscular re-education;Manual techniques;Dry needling;Vasopneumatic Device;Spinal Manipulations;Joint Manipulations    PT Next Visit Plan Progressive knee/hip/core strengthening, prone quad (RF) stretching, balance    PT Home Exercise Plan [email or web address]    Consulted and Agree with Plan of Care Patient             Patient will benefit from skilled therapeutic intervention in order to improve the following deficits and impairments:  Abnormal gait, Decreased balance, Decreased endurance, Pain, Impaired flexibility, Decreased strength  Visit Diagnosis: Right knee pain, unspecified chronicity  Low back pain, unspecified back pain laterality, unspecified chronicity,  unspecified whether sciatica present  Balance problem     Problem List Patient Active Problem List   Diagnosis Date Noted   Annual physical exam 11/26/2016   PCP NOTES >>>>>>>>>>>>> 05/31/2016   Hypertension 02/11/2014   Diabetes mellitus without complication ([place] 139/17/9217  Hyperlipidemia 02/11/2014   PPD positive 02/11/2014    KMathis Dad PT 02/24/2021, 4:10 PM  [facility] Inc[address] [place] [address]Phone: [phone]  Fax:  [phone] Name: Lisa GELLISMRN: [id number] of Birth: [birth year]

## 2021-02-26 ENCOUNTER — Other Ambulatory Visit: Payer: Self-pay

## 2021-02-26 ENCOUNTER — Encounter: Payer: Self-pay | Admitting: Physical Therapy

## 2021-02-26 ENCOUNTER — Ambulatory Visit: Payer: Medicare HMO | Admitting: Physical Therapy

## 2021-02-26 DIAGNOSIS — M6281 Muscle weakness (generalized): Secondary | ICD-10-CM

## 2021-02-26 DIAGNOSIS — M545 Low back pain, unspecified: Secondary | ICD-10-CM | POA: Diagnosis not present

## 2021-02-26 DIAGNOSIS — R2689 Other abnormalities of gait and mobility: Secondary | ICD-10-CM | POA: Diagnosis not present

## 2021-02-26 DIAGNOSIS — M25561 Pain in right knee: Secondary | ICD-10-CM | POA: Diagnosis not present

## 2021-02-26 NOTE — Therapy (Signed)
Lisa Meyers, [place], [address] Phone: [phone]   Fax:  [phone]  PHYSICAL THERAPY DISCHARGE SUMMARY  Visits from Start of Care: 9  Current functional level related to goals / functional outcomes: See assessment/goals   Remaining deficits: See assessment/goals   Education / Equipment: HEP and D/C plans  Patient agrees to discharge. Patient goals were met. Patient is being discharged due to being pleased with the current functional level.   Patient Details  Name: Lisa Meyers MRN: [id number] Date of Birth: [birth year] Referring Provider (PT): Lisa Branch, MD   Encounter Date: 02/26/2021   PT End of Session - 02/26/21 1526     Visit Number 9    Number of Visits 16    Date for PT Re-Evaluation 03/12/21    Authorization Type MCR    Progress Note Due on Visit 10    PT Start Time 8546    PT Stop Time 1600    PT Time Calculation (min) 30 min    Activity Tolerance Patient tolerated treatment well    Behavior During Therapy [facility] for tasks assessed/performed             Past Medical History:  Diagnosis Date   Diabetes mellitus without complication ([place])    Hyperlipidemia 02/11/2014   Hypertension    Neck pain    PPD positive dx in the 90s    Past Surgical History:  Procedure Laterality Date   ABDOMINAL HYSTERECTOMY     abdominal, no oophorectomy   BREAST CYST EXCISION     BREAST EXCISIONAL BIOPSY Right    CYST REMOVAL LEG  07-2013   inner L thigh, Dr Lisa Meyers, Lisa Meyers     There were no vitals filed for this visit.   Objective:  Gait speed: 1.25 m/s  Tandem balance: >30'' ea  Prone knee flexion: 90 degrees  Bil knee flexion 4/5, R knee ext 4/5, L knee ext 4+/5; bil hip abduction 4/5  FOTO: 83%   Therapeutic Exercise: - nu-step L5 39mno UE support while taking subjective and planning session with patient - updating and reviewing HEP  Therapeutic Activity - collecting  information for goals, checking progress, and reviewing with patient  Patient Education: - HEP was updated and reissued to patient; pt educated on HEP, was provided handout, and verbally confirmed understanding of exercises.     PT Short Term Goals - 02/10/21 1654       PT SHORT TERM GOAL #1   Title Lisa Meyers will be >75% HEP compliant to improve carryover between sessions and facilitate independent management of condition    Baseline 11/1: MET    Status Achieved    Target Date 02/05/21               PT Long Term Goals - 02/26/21 1528       PT LONG TERM GOAL #1   Title Lisa Meyers will achieve 110 degrees prone knee flexion by D/C (see POC end date) to improve ability to safely navigate steps in the community  EVAL: 90 degrees bil  target date: 03/12/21    Baseline 11/17: 90 degrees    Status Not Met      PT LONG TERM GOAL #2   Title Lisa Meyers will be able to stand for >30'' in tandem stance, to show a significant improvement in balance in order to reduce fall risk  EVAL: R in rear 3'', L in rear 4'''  target date: 03/13/21    Baseline 11/17: 30'' bil    Status Achieved      PT LONG TERM GOAL #3   Title Lisa Meyers will improve 10 meter max gait speed to 1.2 m/s (.1 m/s MCID) to show functional improvement in ambulation  EVAL: 1.05 m/s  target date: 03/12/21    Baseline 11/17: 1.25 m/s    Status Achieved      PT LONG TERM GOAL #4   Title Lisa Meyers will improve the following MMTs to >/= 4/5 to show improvement in strength:  bil knee ext/flexion, bil hip abduction  EVAL: see flowsheet  target date: 03/13/21    Baseline 02/26/21: Bil knee flexion 4/5, R knee ext 4/5, L knee ext 4+/5; bil hip abduction 4/5    Status Achieved      PT LONG TERM GOAL #5   Title PT's FOTO score will improve to the predicted value of 67%    Baseline 11/17: 83    Status Achieved                   Plan - 02/26/21 1602     Clinical Impression Statement  Lisa Meyers has progressed well with therapy.  Improved impairments include: reduced pain, increased hip and knee strength.  Functional improvements include: improved ability to complete ADL's, navigate stairs, and ambulate in the community.  Progressions needed include: continued work at home via [place] (focus inRF stretching).  Barriers to progress include: none.  Please see baseline and/or status section in "Goals" for specific progress on short term and long term goals established at evaluation.  I recommend D/C home with HEP; pt agrees with plan.    Stability/Clinical Decision Making Stable/Uncomplicated    Rehab Potential Good    PT Frequency 2x / week    PT Duration 8 weeks    PT Treatment/Interventions ADLs/Self Care Home Management;Aquatic Therapy;Electrical Stimulation;Iontophoresis 54m/ml Dexamethasone;Gait training;Therapeutic activities;Therapeutic exercise;Neuromuscular re-education;Manual techniques;Dry needling;Vasopneumatic Device;Spinal Manipulations;Joint Manipulations    PT Next Visit Plan Progressive knee/hip/core strengthening, prone quad (RF) stretching, balance    PT Home Exercise Plan [email or web address]    Consulted and Agree with Plan of Care Patient             Patient will benefit from skilled therapeutic intervention in order to improve the following deficits and impairments:  Abnormal gait, Decreased balance, Decreased endurance, Pain, Impaired flexibility, Decreased strength  Visit Diagnosis: Right knee pain, unspecified chronicity  Low back pain, unspecified back pain laterality, unspecified chronicity, unspecified whether sciatica present  Balance problem  Muscle weakness  Other abnormalities of gait and mobility     Problem List Patient Active Problem List   Diagnosis Date Noted   Annual physical exam 11/26/2016   PCP NOTES >>>>>>>>>>>>> 05/31/2016   Hypertension 02/11/2014   Diabetes mellitus without complication ([place] 115/72/6203  Hyperlipidemia  02/11/2014   PPD positive 02/11/2014    KMathis Meyers PT 02/26/2021, 4:05 PM  [facility] [address]Phone: [phone]  Fax:  [phone] Name: [name]: [id number] of Birth: [birth year]

## 2021-02-26 NOTE — Patient Instructions (Signed)
Access Code: [email or web address] URL: [email or web address] Date: 02/26/2021 Prepared by: Alphonzo Severance  Exercises Supine Bridge - 1 x daily - 7 x weekly - 2 sets - 10 reps Active Straight Leg Raise with Quad Set - 1 x daily - 7 x weekly - 2 sets - 10 reps - 3 hold Sidelying Hip Abduction - 1 x daily - 7 x weekly - 2 sets - 10 reps - 3 hold Seated Long Arc Quad - 1 x daily - 7 x weekly - 3 sets - 10 reps - 3 hold Prone Quadriceps Stretch with Strap - 1 x daily - 7 x weekly - 1 sets - 3 reps - 30'' hold

## 2021-03-12 ENCOUNTER — Telehealth: Payer: Self-pay

## 2021-03-12 NOTE — Telephone Encounter (Signed)
Patient states she believes her lisinopril is bothering her ears( experiencing heaviness in ears after taking the medication 30 mins after) and isn't working for her.. blood pressure is still running high .    Blood pressure-- last night 150/102  EMS came last night as well  -- 133/89   Stated a provider gave her metoprolol in the past and she took that today along with Hydrodiuril and her BP was   135/96   Scheduled patient with Melissa on 03/17/2021

## 2021-03-13 NOTE — Telephone Encounter (Signed)
Pt called and lvm to return call 

## 2021-03-13 NOTE — Telephone Encounter (Signed)
I recommend the following. - Stop lisinopril - Continue hydrochlorothiazide - Send a prescription for metoprolol XL 50 mg 1 tablet daily. - Monitor BPs.   -Keep appointment w/ Lisa Meyers but if possible switch it to see me since I am the primary

## 2021-03-13 NOTE — Telephone Encounter (Signed)
Sending to you incase pt calls back , im on PAL the next 2 days and when scheduling prior appointment pt had a weird work schedule

## 2021-03-17 ENCOUNTER — Ambulatory Visit: Payer: Medicare HMO | Admitting: Family

## 2021-03-23 NOTE — Telephone Encounter (Signed)
Made in error

## 2021-05-12 DIAGNOSIS — Z0100 Encounter for examination of eyes and vision without abnormal findings: Secondary | ICD-10-CM | POA: Diagnosis not present

## 2021-05-12 DIAGNOSIS — E119 Type 2 diabetes mellitus without complications: Secondary | ICD-10-CM | POA: Diagnosis not present

## 2021-05-12 DIAGNOSIS — H524 Presbyopia: Secondary | ICD-10-CM | POA: Diagnosis not present

## 2021-05-12 LAB — HM DIABETES EYE EXAM

## 2021-05-28 ENCOUNTER — Telehealth: Payer: Self-pay | Admitting: Internal Medicine

## 2021-05-28 NOTE — Telephone Encounter (Signed)
Left message for patient to call back and schedule Medicare Annual Wellness Visit (AWV) in office.  ° °If not able to come in office, please offer to do virtually or by telephone.  Left office number and my jabber #[phone]. ° °Due for AWVI ° °Please schedule at anytime with Nurse Health Advisor. °  °

## 2021-06-12 ENCOUNTER — Encounter: Payer: Self-pay | Admitting: Internal Medicine

## 2021-06-19 ENCOUNTER — Telehealth: Payer: Self-pay | Admitting: Internal Medicine

## 2021-06-19 NOTE — Telephone Encounter (Signed)
Left message for patient to call back and schedule Medicare Annual Wellness Visit (AWV) in office.  ° °If not able to come in office, please offer to do virtually or by telephone.  Left office number and my jabber #[phone]. ° °Due for AWVI ° °Please schedule at anytime with Nurse Health Advisor. °  °

## 2021-06-29 ENCOUNTER — Telehealth: Payer: Self-pay | Admitting: Internal Medicine

## 2021-06-29 NOTE — Telephone Encounter (Signed)
LVM for pt schedule OV appt, pt is due 6 month OV. ?

## 2021-08-13 ENCOUNTER — Ambulatory Visit (INDEPENDENT_AMBULATORY_CARE_PROVIDER_SITE_OTHER): Payer: Medicare HMO | Admitting: Internal Medicine

## 2021-08-13 ENCOUNTER — Encounter: Payer: Self-pay | Admitting: Internal Medicine

## 2021-08-13 VITALS — BP 126/82 | HR 72 | Temp 97.6°F | Resp 16 | Ht 67.0 in | Wt 179.0 lb

## 2021-08-13 DIAGNOSIS — E119 Type 2 diabetes mellitus without complications: Secondary | ICD-10-CM

## 2021-08-13 DIAGNOSIS — D649 Anemia, unspecified: Secondary | ICD-10-CM

## 2021-08-13 DIAGNOSIS — I10 Essential (primary) hypertension: Secondary | ICD-10-CM | POA: Diagnosis not present

## 2021-08-13 MED ORDER — LOSARTAN POTASSIUM-HCTZ 100-12.5 MG PO TABS: 1.0000 | ORAL_TABLET | Freq: Every day | ORAL | 1 refills | Status: DC

## 2021-08-13 MED ORDER — METFORMIN HCL 1000 MG PO TABS: 1000.0000 mg | ORAL_TABLET | Freq: Every day | ORAL | Status: AC

## 2021-08-13 NOTE — Patient Instructions (Addendum)
Stop hydrochlorothiazide ? ?Stop lisinopril ? ?Start losartan HCT 100-12.5 mg 1 tablet every morning ? ? ?Check the  blood pressure regularly ?BP GOAL is between 110/65 and  135/85. ?If it is consistently higher or lower, let me know ? ?Schedule appointments in the front desk: ? ?-Come back in 10 days to 2 weeks for blood work ? ?-Come back for a checkup in 3 months ? ?- Please schedule a Medicare Wellness visit. ? ?Recommend to proceed with the following vaccines at your pharmacy:  ?Shingrix #2 (shingles) ?Covid booster (bivalent) ? ? ?For your hip pain: ?Ice your left hip every night ?Take ibuprofen over-the-counter 200 mg: 1 or 2 tablets at nighttime with dinner for the next few days. ?Always take it with food because may cause gastritis and ulcers.  ?If you notice nausea, stomach pain, change in the color of stools --->  Stop the medicine and let us know ? ?

## 2021-08-13 NOTE — Progress Notes (Signed)
? ?Subjective:  ? ? Patient ID: Lisa Meyers, female    DOB: 04/04/1956, 66 y.o.   MRN: [id number] ? ?DOS:  08/13/2021 ?Type of visit - description: f/u ? ?Routine follow-up. ?Has not been taking lisinopril regularly, reports that every time she takes it she has left ear pain, "like it is clogged". ?She specifically denies lip or tongue swelling.  No sore throat, no wheezing. ?Also taking metformin only once daily, "do not like to take it much, because knee pain". ?Also, reports L>R hip pain, worse with walking, this is going on for a while. ?Denies shoulder girdle pain. ? ?Review of Systems ?See above  ? ?Past Medical History:  ?Diagnosis Date  ? Diabetes mellitus without complication ([place])   ? Hyperlipidemia 02/11/2014  ? Hypertension   ? Neck pain   ? PPD positive dx in the 90s  ? ? ?Past Surgical History:  ?Procedure Laterality Date  ? ABDOMINAL HYSTERECTOMY    ? abdominal, no oophorectomy  ? BREAST CYST EXCISION    ? BREAST EXCISIONAL BIOPSY Right   ? CYST REMOVAL LEG  07-2013  ? inner L thigh, Dr Tommi Rumps, [place]   ? ? ?Current Outpatient Medications  ?Medication Instructions  ? blood glucose meter kit and supplies Dispense based on patient and insurance preference. Check blood sugar once daily.  ? glucose blood (ONETOUCH ULTRA) test strip Check blood sugars once daily  ? hydrochlorothiazide (HYDRODIURIL) 25 mg, Oral, Daily  ? lisinopril (ZESTRIL) 20 mg, Oral, Daily  ? lovastatin (MEVACOR) 20 mg, Oral, Daily at bedtime  ? metFORMIN (GLUCOPHAGE) 1,000 mg, Oral, 2 times daily with meals  ? Multiple Vitamins-Minerals (DAILY MULTIVITAMIN PO) Oral  ? vitamin C 100 mg, Oral, Daily  ? VITAMIN D, ERGOCALCIFEROL, PO Oral  ? ? ?   ?Objective:  ? Physical Exam ?BP 126/82 (BP Location: Left Arm, Patient Position: Sitting, Cuff Size: Small)   Pulse 72   Temp 97.6 ?F (36.4 ?C) (Oral)   Resp 16   Ht $R'5\' 7"'ZI$  (1.702 m)   Wt 179 lb (81.2 kg)   BMI 28.04 kg/m?  ?General:   ?Well developed, NAD, BMI noted. ?HEENT:   ?Normocephalic . Face symmetric, atraumatic ?Lungs:  ?CTA B ?Normal respiratory effort, no intercostal retractions, no accessory muscle use. ?Heart: RRR,  no murmur.  ?Lower extremities: ?Slightly TTP at the trochanteric bursa on the left. ?Hip rotation normal bilaterally ?Skin: Not pale. Not jaundice ?Neurologic:  ?alert & oriented X3.  ?Speech normal, gait appropriate for age and unassisted.    ?Psych--  ?Cognition and judgment appear intact.  ?Cooperative with normal attention span and concentration.  ?Behavior appropriate. ?No anxious or depressed appearing.  ? ?   ?Assessment   ? ?  Assessment ?DM ?HTN. Amlodipine d/c 2019 d/t swelling  ?Hyperlipidemia ?+ PPD 1990s ?+ PPD 09/03/2015, 16 mm, x-ray negative, completed 3 months of INH 12/11/2015 at [place] ?Chronic neck, back pain (x years). ?  ?PLAN: ?DM: Last A1c 6.9, would like to see A1c around 6.5.  Currently taking metformin once a day, she thinks that too much metformin causing knee pain, advised patient that would  be extremely rare. ?We agreed to check a A1c, if needed she is willing to try a higher dose of metformin.  Further advised with results. ?HTN: Patient is convinced that lisinopril is causing left ear discomfort.  No symptoms of angioedema noted, see review of systems. ?I again explained patient this will be extremely uncommon but at  the end we agreed on the following: ?Stop lisinopril HCTZ, start losartan HCT 100-12.5 mg 1 tablet daily. ?Monitor BPs ?Check BMP in about 2 weeks ?Mild anemia: Noted on chart review, check CBC. ?Vaccine advice: Recommend shingrix #2, COVID booster is an option ?RTC labs 2 weeks, RTC follow-up 3 months ?  ?

## 2021-08-14 NOTE — Assessment & Plan Note (Signed)
DM: Last A1c 6.9, would like to see A1c around 6.5.  Currently taking metformin once a day, she thinks that too much metformin causing knee pain, advised patient that would  be extremely rare. ?We agreed to check a A1c, if needed she is willing to try a higher dose of metformin.  Further advised with results. ?HTN: Patient is convinced that lisinopril is causing left ear discomfort.  No symptoms of angioedema noted, see review of systems. ?I again explained patient this will be extremely uncommon but at the end we agreed on the following: ?Stop lisinopril HCTZ, start losartan HCT 100-12.5 mg 1 tablet daily. ?Monitor BPs ?Check BMP in about 2 weeks ?Mild anemia: Noted on chart review, check CBC. ?Vaccine advice: Recommend shingrix #2, COVID booster is an option ?RTC labs 2 weeks, RTC follow-up 3 months ?  ?

## 2021-08-17 ENCOUNTER — Telehealth: Payer: Self-pay | Admitting: Internal Medicine

## 2021-08-17 NOTE — Telephone Encounter (Signed)
Pt was seen on 08/13/21- Pt believed this was a side effect from lisinopril, rx was changed to losartan.  ?

## 2021-08-17 NOTE — Telephone Encounter (Signed)
Lisa Meyers [facility] Medicare D) called stating that the pt was experiencing a reaction to her losartan. Pt stated to her that 30 mins after she takes it, it sounds like her ears are full of water. Please Advise.  ?

## 2021-09-04 ENCOUNTER — Encounter: Payer: Self-pay | Admitting: Internal Medicine

## 2021-09-04 ENCOUNTER — Telehealth: Payer: Medicare HMO | Admitting: Internal Medicine

## 2021-09-04 ENCOUNTER — Telehealth: Payer: Self-pay | Admitting: Internal Medicine

## 2021-09-04 VITALS — Ht 67.0 in

## 2021-09-04 NOTE — Telephone Encounter (Signed)
Pt called stating that the losartan that she is taking is not working for her and was wondering if she could be put back on the lisinopril.  Pt also is wondering if she could be referred to an ENT specialist for issues with her ear.

## 2021-09-04 NOTE — Telephone Encounter (Signed)
She missed her appt today- she was supposed to come back for blood work- recommend she schedule a visit next week to discuss ongoing concerns w/ Dr. Drue Novel and so we can check her labs.

## 2021-09-04 NOTE — Progress Notes (Signed)
   Complete physical exam  Patient: Lisa Meyers   DOB: 01/30/2001   66 y.o. Female  MRN: [id number]  Subjective:    No chief complaint on file.   Lisa Meyers is a 66 y.o. female who presents today for a complete physical exam. She reports consuming a {diet types:17450} diet. {types:19826} She generally feels {DESC; WELL/FAIRLY WELL/POORLY:18703}. She reports sleeping {DESC; WELL/FAIRLY WELL/POORLY:18703}. She {does/does not:200015} have additional problems to discuss today.    Most recent fall risk assessment:    10/07/2021   10:42 AM  Fall Risk   Falls in the past year? 0  Number falls in past yr: 0  Injury with Fall? 0  Risk for fall due to : No Fall Risks  Follow up Falls evaluation completed     Most recent depression screenings:    10/07/2021   10:42 AM 08/28/2020   10:46 AM  PHQ 2/9 Scores  PHQ - 2 Score 0 0  PHQ- 9 Score 5     {VISON DENTAL STD PSA (Optional):27386}  {History (Optional):23778}  Patient Care Team: Jessup, Joy, NP as PCP - General (Nurse Practitioner)   Outpatient Medications Prior to Visit  Medication Sig   fluticasone (FLONASE) 50 MCG/ACT nasal spray Place 2 sprays into both nostrils in the morning and at bedtime. After 7 days, reduce to once daily.   norgestimate-ethinyl estradiol (SPRINTEC 28) 0.25-35 MG-MCG tablet Take 1 tablet by mouth daily.   Nystatin POWD Apply liberally to affected area 2 times per day   spironolactone (ALDACTONE) 100 MG tablet Take 1 tablet (100 mg total) by mouth daily.   No facility-administered medications prior to visit.    ROS        Objective:     There were no vitals taken for this visit. {Vitals History (Optional):23777}  Physical Exam   No results found for any visits on 11/12/21. {Show previous labs (optional):23779}    Assessment & Plan:    Routine Health Maintenance and Physical Exam  Immunization History  Administered Date(s) Administered   DTaP 04/15/1999, 06/11/1999,  08/20/1999, 05/05/2000, 11/19/2003   Hepatitis A 09/15/2007, 09/20/2008   Hepatitis B 01/31/1999, 03/10/1999, 08/20/1999   HiB (PRP-OMP) 04/15/1999, 06/11/1999, 08/20/1999, 05/05/2000   IPV 04/15/1999, 06/11/1999, 02/08/2000, 11/19/2003   Influenza,inj,Quad PF,6+ Mos 12/21/2013   Influenza-Unspecified 03/22/2012   MMR 02/07/2001, 11/19/2003   Meningococcal Polysaccharide 09/20/2011   Pneumococcal Conjugate-13 05/05/2000   Pneumococcal-Unspecified 08/20/1999, 11/03/1999   Tdap 09/20/2011   Varicella 02/08/2000, 09/15/2007    Health Maintenance  Topic Date Due   HIV Screening  Never done   Hepatitis C Screening  Never done   INFLUENZA VACCINE  11/10/2021   PAP-Cervical Cytology Screening  11/12/2021 (Originally 01/30/2020)   PAP SMEAR-Modifier  11/12/2021 (Originally 01/30/2020)   TETANUS/TDAP  11/12/2021 (Originally 09/19/2021)   HPV VACCINES  Discontinued   COVID-19 Vaccine  Discontinued    Discussed health benefits of physical activity, and encouraged her to engage in regular exercise appropriate for her age and condition.  Problem List Items Addressed This Visit   None Visit Diagnoses     Annual physical exam    -  Primary   Cervical cancer screening       Need for Tdap vaccination          No follow-ups on file.     Joy Jessup, NP   

## 2021-09-05 ENCOUNTER — Other Ambulatory Visit: Payer: Self-pay | Admitting: Internal Medicine

## 2021-09-24 ENCOUNTER — Other Ambulatory Visit: Payer: Self-pay | Admitting: Internal Medicine

## 2021-09-29 ENCOUNTER — Encounter: Payer: Self-pay | Admitting: *Deleted

## 2021-10-01 ENCOUNTER — Encounter: Payer: Self-pay | Admitting: Internal Medicine

## 2021-10-01 ENCOUNTER — Telehealth: Payer: Self-pay

## 2021-10-01 DIAGNOSIS — I10 Essential (primary) hypertension: Secondary | ICD-10-CM

## 2021-10-01 MED ORDER — LOSARTAN POTASSIUM 50 MG PO TABS: 50.0000 mg | ORAL_TABLET | Freq: Every day | ORAL | 0 refills | Status: DC

## 2021-10-01 NOTE — Telephone Encounter (Signed)
Pt called back- informed recommendations- she has switched to [facility]- I have called [facility] to cancel losartan prescription and resent Rx to [facility]. Pt declined to schedule lab appt at this time, states she will call back.

## 2021-10-01 NOTE — Addendum Note (Signed)
Addended byConrad Cherry D on: 10/01/2021 01:02 PM   Modules accepted: Orders

## 2021-10-01 NOTE — Telephone Encounter (Signed)
Patient reports "feeling very sick every time she takes Losartan" blood pressure has been about 110/76 on average.  "Gets pressure on the left side of head by her left ear" She previously had side effects with lisinopril.  She reports also having side effects before with Toprol and amlodipine.  She is coming in tomorrow for her labs

## 2021-10-01 NOTE — Telephone Encounter (Signed)
LMOM asking for call back.  

## 2021-10-01 NOTE — Telephone Encounter (Signed)
Call patient.  -Recommend to stop losartan HCT, some of the side effects may be due to over control of her blood pressure. -We will switch to plain losartan 50 mg 1 tablet daily. Prescription sent already. -Arrange for a BMP in 2 weeks.   -Advised to check BPs to be sure they are well controlled,  goal between 110/65 and  145/85.

## 2021-10-01 NOTE — Addendum Note (Signed)
Addended by: Willow Ora E on: 10/01/2021 11:57 AM   Modules accepted: Orders

## 2021-10-01 NOTE — Addendum Note (Signed)
Addended byConrad [place] D on: 10/01/2021 12:51 PM   Modules accepted: Orders

## 2021-10-02 ENCOUNTER — Other Ambulatory Visit: Payer: Medicare HMO

## 2021-10-14 ENCOUNTER — Telehealth: Payer: Self-pay | Admitting: Internal Medicine

## 2021-10-14 NOTE — Telephone Encounter (Signed)
Since the last visit, we have been going back and forth regarding her BP. Plan: Continue losartan as before Restart HCTZ 12.5 mg 1 tablet daily #30 no refill. Arrange a office visit here in 2 to 3 weeks. Advise patient:  is important to come back, I cannot continue managing his multiple side effects over the phone.

## 2021-10-14 NOTE — Telephone Encounter (Signed)
On 10/01/21: Pt was advised to stop losartan HCT, and switch to plain losartan 50 mg 1 tablet daily. She canceled her 2 week lab appointment.   Please advise.

## 2021-10-14 NOTE — Telephone Encounter (Signed)
Pt stated new medication makes her feet swell and she needs a water pill.

## 2021-10-15 MED ORDER — LOSARTAN POTASSIUM-HCTZ 50-12.5 MG PO TABS: 1.0000 | ORAL_TABLET | Freq: Every day | ORAL | 0 refills | Status: DC

## 2021-10-15 NOTE — Telephone Encounter (Signed)
See messages

## 2021-10-15 NOTE — Telephone Encounter (Signed)
Spoke w/ Pt- informed of recommendations. Pt verbalized understanding. Losartan-hctz 50-12.5mg  sent to [facility]. Appt scheduled w/ Dr. Drue Novel on 10/30/21.

## 2021-10-16 MED ORDER — HYDROCHLOROTHIAZIDE 12.5 MG PO TABS: 12.5000 mg | ORAL_TABLET | Freq: Every day | ORAL | 0 refills | Status: DC

## 2021-10-16 MED ORDER — LOSARTAN POTASSIUM 50 MG PO TABS: 50.0000 mg | ORAL_TABLET | Freq: Every day | ORAL | 0 refills | Status: DC

## 2021-10-16 NOTE — Telephone Encounter (Signed)
Okay to send 2 separate prescriptions. If she still have more concerns, schedule office visit next week

## 2021-10-16 NOTE — Telephone Encounter (Signed)
2 separate prescriptions sent.

## 2021-10-16 NOTE — Addendum Note (Signed)
Addended byConrad [place] D on: 10/16/2021 01:45 PM   Modules accepted: Orders

## 2021-10-16 NOTE — Telephone Encounter (Signed)
Patient states her losartan with her water pill combined gives her ear pain. She would like a call back to discuss separating them. Please advise.

## 2021-10-23 ENCOUNTER — Telehealth: Payer: Self-pay

## 2021-10-23 NOTE — Telephone Encounter (Addendum)
Pt called in states that she has be experiencing pressure in ears, headaches, and it is making her very miserable. Pt would like a different bp medication sent. Possibly lisinopril and amlodipine.

## 2021-10-23 NOTE — Telephone Encounter (Signed)
She needs to follow up with her PCP next week or someone else if Dr. Drue Novel is unavailable. Ty.

## 2021-10-26 NOTE — Telephone Encounter (Signed)
Pt has appt on 10/30/21.

## 2021-10-30 ENCOUNTER — Ambulatory Visit (INDEPENDENT_AMBULATORY_CARE_PROVIDER_SITE_OTHER): Payer: Medicare HMO | Admitting: Internal Medicine

## 2021-10-30 ENCOUNTER — Encounter: Payer: Self-pay | Admitting: Internal Medicine

## 2021-10-30 VITALS — BP 128/78 | HR 81 | Temp 97.6°F | Resp 16 | Ht 67.0 in | Wt 178.6 lb

## 2021-10-30 DIAGNOSIS — D649 Anemia, unspecified: Secondary | ICD-10-CM

## 2021-10-30 DIAGNOSIS — I10 Essential (primary) hypertension: Secondary | ICD-10-CM | POA: Diagnosis not present

## 2021-10-30 DIAGNOSIS — E119 Type 2 diabetes mellitus without complications: Secondary | ICD-10-CM

## 2021-10-30 DIAGNOSIS — E785 Hyperlipidemia, unspecified: Secondary | ICD-10-CM | POA: Diagnosis not present

## 2021-10-30 MED ORDER — LISINOPRIL 20 MG PO TABS: 20.0000 mg | ORAL_TABLET | Freq: Every day | ORAL | 6 refills | Status: DC

## 2021-10-30 NOTE — Progress Notes (Signed)
Subjective:    Patient ID: Lisa Meyers, female    DOB: Dec 16, 1955, 66 y.o.   MRN: [id number]  DOS:  10/30/2021 Type of visit - description: Follow-up  Today with the, hypertension, high cholesterol and diabetes. Medications for BP were adjusted but she was unable to tolerate losartan. I noted some anemia on chart review. She denies any nausea, vomiting or diarrhea.  No blood in the stools.  Review of Systems See above   Past Medical History:  Diagnosis Date   Diabetes mellitus without complication ([place])    Hyperlipidemia 02/11/2014   Hypertension    Neck pain    PPD positive dx in the 90s    Past Surgical History:  Procedure Laterality Date   ABDOMINAL HYSTERECTOMY     abdominal, no oophorectomy   BREAST CYST EXCISION     BREAST EXCISIONAL BIOPSY Right    CYST REMOVAL LEG  07-2013   inner L thigh, Dr Tommi Rumps, [place]     Current Outpatient Medications  Medication Instructions   blood glucose meter kit and supplies Dispense based on patient and insurance preference. Check blood sugar once daily.   glucose blood (ONETOUCH ULTRA) test strip Check blood sugars once daily   hydrochlorothiazide (HYDRODIURIL) 12.5 mg, Oral, Daily   lisinopril (ZESTRIL) 20 mg, Oral, Daily   lovastatin (MEVACOR) 20 mg, Oral, Daily at bedtime   metFORMIN (GLUCOPHAGE) 1,000 mg, Oral, Daily with breakfast   Multiple Vitamins-Minerals (DAILY MULTIVITAMIN PO) Oral   vitamin C 100 mg, Oral, Daily   VITAMIN D, ERGOCALCIFEROL, PO Oral       Objective:   Physical Exam BP 128/78 (BP Location: Right Arm, Patient Position: Sitting, Cuff Size: Normal)   Pulse 81   Temp 97.6 F (36.4 C) (Oral)   Resp 16   Ht $R'5\' 7"'HT$  (1.702 m)   Wt 178 lb 9.6 oz (81 kg)   SpO2 96%   BMI 27.97 kg/m  General:   Well developed, NAD, BMI noted. HEENT:  Normocephalic . Face symmetric, atraumatic Lungs:  CTA B Normal respiratory effort, no intercostal retractions, no accessory muscle use. Heart: RRR,  no murmur.   Lower extremities: no pretibial edema bilaterally  Skin: Not pale. Not jaundice Neurologic:  alert & oriented X3.  Speech normal, gait appropriate for age and unassisted Psych--  Cognition and judgment appear intact.  Cooperative with normal attention span and concentration.  Behavior appropriate. No anxious or depressed appearing.      Assessment       Assessment DM HTN. Amlodipine d/c 2019 d/t swelling  Hyperlipidemia + PPD 1990s + PPD 09/03/2015, 16 mm, x-ray negative, completed 3 months of INH 12/11/2015 at [place] Chronic neck, back pain (x years).   PLAN: DM: Last A1c 6.9 back in November 2022, despite having some knee pain with metformin she is taking it regularly.  Check A1c HTN: See last visit, due to ear discomfort lisinopril was switched to losartan, reports that losartan was "even worse" causing more ear pain feeling "miserable".  She went back on lisinopril and also on HCTZ.  Check BMP. High cholesterol: Reports good compliance with Mevacor.  Check FLP Anemia: Mild, per chart review, no GI symptoms, never had a scope.  Checking CBC, iron, ferritin. Preventive care: Up-to-date on COVID vaccines, flu shot this fall recommended RTC 3 to 4 months CPE  5-4 DM: Last A1c 6.9, would like to see A1c around 6.5.  Currently taking metformin once a day, she thinks that  too much metformin causing knee pain, advised patient that would  be extremely rare. We agreed to check a A1c, if needed she is willing to try a higher dose of metformin.  Further advised with results. HTN: Patient is convinced that lisinopril is causing left ear discomfort.  No symptoms of angioedema noted, see review of systems. I again explained patient this will be extremely uncommon but at the end we agreed on the following: Stop lisinopril HCTZ, start losartan HCT 100-12.5 mg 1 tablet daily. Monitor BPs Check BMP in about 2 weeks Mild anemia: Noted on chart review, check CBC. Vaccine  advice: Recommend shingrix #2, COVID booster is an option RTC labs 2 weeks, RTC follow-up 3 months

## 2021-10-30 NOTE — Patient Instructions (Addendum)
  Flu shot this fall.   Check the  blood pressure regularly BP GOAL is between 110/65 and  135/85. If it is consistently higher or lower, let me know    GO TO THE LAB : Get the blood work     GO TO THE FRONT DESK, PLEASE SCHEDULE YOUR APPOINTMENTS Come back for   a physical exam in 3 to 4 months

## 2021-10-31 LAB — CBC WITH DIFFERENTIAL/PLATELET
Absolute Monocytes: 301 cells/uL (ref 200–950)
Basophils Absolute: 10 cells/uL (ref 0–200)
Basophils Relative: 0.3 %
Eosinophils Absolute: 61 cells/uL (ref 15–500)
Eosinophils Relative: 1.9 %
HCT: 36 % (ref 35.0–45.0)
Hemoglobin: 12.1 g/dL (ref 11.7–15.5)
Lymphs Abs: 2022 cells/uL (ref 850–3900)
MCH: 30.6 pg (ref 27.0–33.0)
MCHC: 33.6 g/dL (ref 32.0–36.0)
MCV: 90.9 fL (ref 80.0–100.0)
MPV: 10.4 fL (ref 7.5–12.5)
Monocytes Relative: 9.4 %
Neutro Abs: 806 cells/uL — ABNORMAL LOW (ref 1500–7800)
Neutrophils Relative %: 25.2 %
Platelets: 249 10*3/uL (ref 140–400)
RBC: 3.96 10*6/uL (ref 3.80–5.10)
RDW: 12.6 % (ref 11.0–15.0)
Total Lymphocyte: 63.2 %
WBC: 3.2 10*3/uL — ABNORMAL LOW (ref 3.8–10.8)

## 2021-10-31 LAB — BASIC METABOLIC PANEL
BUN: 13 mg/dL (ref 7–25)
CO2: 27 mmol/L (ref 20–32)
Calcium: 10.1 mg/dL (ref 8.6–10.4)
Chloride: 107 mmol/L (ref 98–110)
Creat: 0.75 mg/dL (ref 0.50–1.05)
Glucose, Bld: 109 mg/dL — ABNORMAL HIGH (ref 65–99)
Potassium: 3.8 mmol/L (ref 3.5–5.3)
Sodium: 141 mmol/L (ref 135–146)

## 2021-10-31 LAB — HEMOGLOBIN A1C
Hgb A1c MFr Bld: 6.2 % of total Hgb — ABNORMAL HIGH (ref <5.7)
Mean Plasma Glucose: 131 mg/dL
eAG (mmol/L): 7.3 mmol/L

## 2021-10-31 LAB — LIPID PANEL
Cholesterol: 162 mg/dL (ref <200)
HDL: 64 mg/dL (ref >=50)
LDL Cholesterol (Calc): 87 mg/dL (calc)
Non-HDL Cholesterol (Calc): 98 mg/dL (calc) (ref <130)
Total CHOL/HDL Ratio: 2.5 (calc) (ref <5.0)
Triglycerides: 40 mg/dL (ref <150)

## 2021-10-31 LAB — IRON: Iron: 80 ug/dL (ref 45–160)

## 2021-10-31 LAB — FERRITIN: Ferritin: 78 ng/mL (ref 16–288)

## 2021-11-01 NOTE — Assessment & Plan Note (Signed)
DM: Last A1c 6.9 back in November 2022, despite having s/e from metformin (she reports knee pain)  is taking it regularly.  Check A1c HTN: See last visit, due to ear discomfort w/ lisinopril was switched to losartan, reports that losartan was "even worse" causing more ear pain and feeling "miserable".  She went back on lisinopril and also on HCTZ.  Check BMP. High cholesterol: Reports good compliance with Mevacor.  Check FLP Anemia: Mild, per chart review, no GI symptoms, never had a scope.  Checking CBC, iron, ferritin. Preventive care: Up-to-date on COVID vaccines, flu shot this fall recommended RTC 3 to 4 months CPE

## 2021-11-04 DIAGNOSIS — M26609 Unspecified temporomandibular joint disorder, unspecified side: Secondary | ICD-10-CM | POA: Diagnosis not present

## 2021-11-04 DIAGNOSIS — H938X3 Other specified disorders of ear, bilateral: Secondary | ICD-10-CM | POA: Diagnosis not present

## 2021-11-04 DIAGNOSIS — H93293 Other abnormal auditory perceptions, bilateral: Secondary | ICD-10-CM | POA: Diagnosis not present

## 2021-11-13 ENCOUNTER — Telehealth: Payer: Self-pay | Admitting: Internal Medicine

## 2021-11-13 NOTE — Telephone Encounter (Signed)
Left message for patient to call back and schedule Medicare Annual Wellness Visit (AWV).   Please offer to do virtually or by telephone.  Left office number and my jabber [phone].  AWVI eligible as of 04/13/2019  Please schedule at anytime with Nurse Health Advisor.

## 2021-11-14 ENCOUNTER — Other Ambulatory Visit: Payer: Self-pay | Admitting: Internal Medicine

## 2021-12-02 ENCOUNTER — Encounter: Payer: Self-pay | Admitting: Internal Medicine

## 2021-12-07 DIAGNOSIS — H9193 Unspecified hearing loss, bilateral: Secondary | ICD-10-CM | POA: Diagnosis not present

## 2021-12-10 ENCOUNTER — Other Ambulatory Visit: Payer: Self-pay | Admitting: Internal Medicine

## 2021-12-10 ENCOUNTER — Telehealth: Payer: Self-pay | Admitting: Internal Medicine

## 2021-12-10 NOTE — Telephone Encounter (Signed)
Pt is scheduled w/ new PCP in 02/2012.

## 2021-12-10 NOTE — Telephone Encounter (Signed)
Patient called to let Dr. Drue Novel know that the water pill, blood pressure pill, and sugar pill are making her sick. She states they are making her ear hurt and she's going to stop taking them. Please advise.

## 2021-12-16 ENCOUNTER — Telehealth: Payer: Self-pay | Admitting: Internal Medicine

## 2021-12-16 NOTE — Telephone Encounter (Signed)
Please schedule appt for 9/11- unfortunately PCP does not have any appts before then.

## 2021-12-16 NOTE — Telephone Encounter (Signed)
Pt called stating she is experiencing the following symptoms:  -Loss of Appitite -Stomach feels "off"  Pt stated she would only like to see Dr. Drue Novel and pt was advised that he does not have an available appt until 9.11.23. Pt would like to know what the best course of action would be to get this resolved.

## 2021-12-17 ENCOUNTER — Encounter: Payer: Self-pay | Admitting: Internal Medicine

## 2021-12-29 ENCOUNTER — Encounter: Payer: Self-pay | Admitting: Internal Medicine

## 2021-12-29 ENCOUNTER — Ambulatory Visit: Payer: Medicare HMO

## 2021-12-29 ENCOUNTER — Telehealth: Payer: Self-pay | Admitting: Internal Medicine

## 2021-12-29 ENCOUNTER — Ambulatory Visit: Payer: Medicare HMO | Admitting: Internal Medicine

## 2021-12-29 NOTE — Telephone Encounter (Signed)
After Kennyth Lose advised me of the situation with patient, I spoke with PCP's CMA.  CMA advised PCP was full and could not accommodate the patient today.  Kennyth Lose advised she was going to try to assist patient by looking at other providers' schedules, but patient was not having anything to do with that and wanted to be seen by her doctor and had taken a seat in the lobby after being loud in the lobby with her demands.  I advised Engineer, building services of situation and went to speak with patient as she was sitting by herself on one side of the lobby with no patients around her.  I advised patient Dr. Larose Kells could not see her today, but we could get her in for a virtual visit with Dr. Colin Benton at the [place] location.  Patient stated no and got up and proceeded to the door of the office and turned around and stated she would not be seeing the nurse today either (for her annual wellness visit which was to follow her acute visit with Dr. Larose Kells).  She also state she would not be coming back to the office again.  At that point, I made Dr. Larose Kells and his CMA aware of the situation and decision was made by provider to dismiss patient from practice.

## 2021-12-29 NOTE — Telephone Encounter (Signed)
Patient was extremely disrespectful to me by staff.  That is unacceptable.  The dismissal letter was signed.

## 2021-12-29 NOTE — Telephone Encounter (Signed)
Pt came in office arriving for her appt at 10:15 am when appt was at 9:00 am- pt also had 9:40 appt for Wellness Visit, informed pt since appt was at 9:00 and 9:40 was needed to be rescheduled, pt got very angry, without letting me explain that we can get her scheduled with another provider or at another time, pt stated almost yelling that "she was very sick and "WAS NOT LEAVING" until seeing provider". Pt continue saying the same phrase not letting me give her other options and sat down furious in the lobby. Informed immediately my supervisor and provider's CMA.

## 2022-01-06 ENCOUNTER — Encounter (HOSPITAL_COMMUNITY): Payer: Self-pay

## 2022-01-06 ENCOUNTER — Ambulatory Visit (HOSPITAL_COMMUNITY)
Admission: EM | Admit: 2022-01-06 | Discharge: 2022-01-06 | Disposition: A | Discharge status: Home / Self Care | Payer: Medicare HMO | Attending: Family Medicine | Admitting: Family Medicine

## 2022-01-06 DIAGNOSIS — H9203 Otalgia, bilateral: Secondary | ICD-10-CM

## 2022-01-06 DIAGNOSIS — M26623 Arthralgia of bilateral temporomandibular joint: Secondary | ICD-10-CM | POA: Diagnosis not present

## 2022-01-06 MED ORDER — KETOROLAC TROMETHAMINE 30 MG/ML IJ SOLN
30.0000 mg | Freq: Once | INTRAMUSCULAR | Status: AC
  Administered 2022-01-06: 30 mg via INTRAMUSCULAR

## 2022-01-06 MED ORDER — MELOXICAM 7.5 MG PO TABS: 7.5000 mg | ORAL_TABLET | Freq: Every day | ORAL | 0 refills | Status: DC

## 2022-01-06 MED ORDER — KETOROLAC TROMETHAMINE 30 MG/ML IJ SOLN
INTRAMUSCULAR | Status: AC
  Filled 2022-01-06: qty 1

## 2022-01-06 NOTE — Discharge Instructions (Addendum)
You have been given a shot of Toradol 30 mg today.  Take meloxicam 7.5 mg--1 daily for pain.  This can help with there is irritation in your joints of your jaw.  Please follow-up with your ENT about these issues.  Please consider going to your dentist and talking with him about the pain in the joints of your jaw.  A bite block may or may not help

## 2022-01-06 NOTE — ED Triage Notes (Signed)
Pt is here for left ear pain , fullness, dizziness. Pt states she feels pain when swallowing . Pt states this has been going on for a awhile . Pt stated she been too a ENT but, they keep saying nothing is wrong other than, she is loosing her hearing

## 2022-01-06 NOTE — ED Provider Notes (Signed)
[facility]    CSN: [id number] Arrival date & time: 01/06/22  1827      History   Chief Complaint Chief Complaint  Patient presents with   Otalgia    HPI Lisa Meyers is a 66 y.o. female.    Otalgia  Here for persistent pain in her ears and ear fullness.  She states the ears hurt more when she swallows.  She also states it hurts more when she chews.  She has seen her PCP about this and has been referred to an ENT.  It looks like and notes reviewed in care everywhere in epic, that they think she has TMJ syndrome.  They also apparently done a hearing test and told her that she had a very mild hearing loss.  She comes in today stating that she wants [place] to tell her if she is truly losing her hearing and what is causing it and what is going to happen.  No recent fever and no increase in her symptoms.  She was told by somebody to take an allergy medicine with decongestant in it, and it is not helped.  She has taken Tylenol at home, and I have a difficult time teasing out whether or not she is actually tried it and if it helped or not.  She has diabetes and states her sugar was high today.  It was 150.  She states she has been discharged from her primary care clinic office because she was an hour late to an appointment.  She is set up an appointment with a new PCP, but that will not happen till November.  Most recent creatinine was normal  Past Medical History:  Diagnosis Date   Diabetes mellitus without complication ([place])    Hyperlipidemia 02/11/2014   Hypertension    Neck pain    PPD positive dx in the 90s    Patient Active Problem List   Diagnosis Date Noted   Ear fullness, bilateral 11/04/2021   Temporomandibular joint dysfunction 11/04/2021   Abnormal auditory perception of both ears 11/04/2021   Annual physical exam 11/26/2016   PCP NOTES >>>>>>>>>>>>> 05/31/2016   Hypertension 02/11/2014   Diabetes mellitus without complication ([place]) 45/36/4680    Hyperlipidemia 02/11/2014   PPD positive 02/11/2014    Past Surgical History:  Procedure Laterality Date   ABDOMINAL HYSTERECTOMY     abdominal, no oophorectomy   BREAST CYST EXCISION     BREAST EXCISIONAL BIOPSY Right    CYST REMOVAL LEG  07-2013   inner L thigh, Dr Tommi Rumps, [place]     OB History     Gravida  1   Para  1   Term      Preterm      AB      Living  1      SAB      IAB      Ectopic      Multiple      Live Births  1            Home Medications    Prior to Admission medications   Medication Sig Start Date End Date Taking? Authorizing Provider  meloxicam (MOBIC) 7.5 MG tablet Take 1 tablet (7.5 mg total) by mouth daily. 01/06/22  Yes Barrett Henle, MD  Ascorbic Acid (VITAMIN C) 100 MG tablet Take 100 mg by mouth daily.    [provider]  blood glucose meter kit and supplies Dispense based on patient and insurance preference. Check  blood sugar once daily. 06/06/19   Colon Branch, MD  glucose blood [facility] ULTRA) test strip Check blood sugars once daily 08/21/19   Colon Branch, MD  hydrochlorothiazide (HYDRODIURIL) 12.5 MG tablet Take 1 tablet by mouth once daily 11/16/21   Colon Branch, MD  lisinopril (ZESTRIL) 20 MG tablet Take 1 tablet (20 mg total) by mouth daily. 12/10/21   Colon Branch, MD  lovastatin (MEVACOR) 20 MG tablet Take 1 tablet (20 mg total) by mouth at bedtime. 02/23/21   Copland, Gay Filler, MD  metFORMIN (GLUCOPHAGE) 1000 MG tablet Take 1 tablet (1,000 mg total) by mouth daily with breakfast. 08/13/21   Colon Branch, MD  Multiple Vitamins-Minerals (DAILY MULTIVITAMIN PO) Take by mouth.    [provider]  VITAMIN D, ERGOCALCIFEROL, PO Take by mouth.    [provider]    Family History Family History  Problem Relation Age of Onset   Hypertension Mother    Hypertension Father    Heart disease Father    Diabetes Maternal Aunt    Colon cancer Neg Hx    Breast cancer Neg Hx    CAD Neg Hx     Social  History Social History   Tobacco Use   Smoking status: Never   Smokeless tobacco: Never  Substance Use Topics   Alcohol use: Yes    Alcohol/week: 0.0 standard drinks of alcohol    Comment: wine, rare    Drug use: No     Allergies   Patient has no known allergies.   Review of Systems Review of Systems  HENT:  Positive for ear pain.      Physical Exam Triage Vital Signs ED Triage Vitals  Enc Vitals Group     BP 01/06/22 1936 130/87     Pulse Rate 01/06/22 1936 (!) 132     Resp 01/06/22 1936 12     Temp 01/06/22 1936 98.3 F (36.8 C)     Temp Source 01/06/22 1936 Oral     SpO2 01/06/22 1936 99 %     Weight 01/06/22 1928 168 lb (76.2 kg)     Height --      Head Circumference --      Peak Flow --      Pain Score 01/06/22 1928 4     Pain Loc --      Pain Edu? --      Excl. in [place]? --    No data found.  Updated Vital Signs BP 130/87 (BP Location: Left Arm)   Pulse (!) 132   Temp 98.3 F (36.8 C) (Oral)   Resp 12   Wt 76.2 kg   SpO2 99%   BMI 26.31 kg/m   Visual Acuity Right Eye Distance:   Left Eye Distance:   Bilateral Distance:    Right Eye Near:   Left Eye Near:    Bilateral Near:     Physical Exam Vitals reviewed.  Constitutional:      General: She is not in acute distress.    Appearance: She is not toxic-appearing.  HENT:     Right Ear: Tympanic membrane and ear canal normal.     Left Ear: Tympanic membrane and ear canal normal.     Ears:     Comments: Both tympanic membranes are normal, gray and shiny.  Her temporomandibular joint bilaterally is tender.  There is no swelling or mass there.  Oropharynx is benign.    Nose: Nose normal.  Mouth/Throat:     Mouth: Mucous membranes are moist.     Pharynx: No oropharyngeal exudate or posterior oropharyngeal erythema.  Eyes:     Extraocular Movements: Extraocular movements intact.     Conjunctiva/sclera: Conjunctivae normal.     Pupils: Pupils are equal, round, and reactive to light.   Cardiovascular:     Rate and Rhythm: Normal rate and regular rhythm.     Heart sounds: No murmur heard. Pulmonary:     Effort: Pulmonary effort is normal. No respiratory distress.     Breath sounds: No stridor. No wheezing, rhonchi or rales.  Musculoskeletal:     Cervical back: Neck supple.  Lymphadenopathy:     Cervical: No cervical adenopathy.  Skin:    Capillary Refill: Capillary refill takes less than 2 seconds.     Coloration: Skin is not jaundiced or pale.  Neurological:     General: No focal deficit present.     Mental Status: She is alert and oriented to person, place, and time.  Psychiatric:        Behavior: Behavior normal.      UC Treatments / Results  Labs (all labs ordered are listed, but only abnormal results are displayed) Labs Reviewed - No data to display  EKG   Radiology No results found.  Procedures Procedures (including critical care time)  Medications Ordered in UC Medications  ketorolac (TORADOL) 30 MG/ML injection 30 mg (has no administration in time range)    Initial Impression / Assessment and Plan / UC Course  I have reviewed the triage vital signs and the nursing notes.  Pertinent labs & imaging results that were available during my care of the patient were reviewed by me and considered in my medical decision making (see chart for details).        I have made an effort to try to provide a little reassurance about her hearing loss, as it sounds like is not that severe or progressive.  Also we will provide a Toradol shot tonight and an anti-inflammatory for a brief time.  I have asked her to follow-up with her ENT and with the primary care about these issues. Final Clinical Impressions(s) / UC Diagnoses   Final diagnoses:  Otalgia of both ears  Bilateral temporomandibular joint pain     Discharge Instructions      You have been given a shot of Toradol 30 mg today.  Take meloxicam 7.5 mg--1 daily for pain.  This can help with  there is irritation in your joints of your jaw.  Please follow-up with your ENT about these issues.  Please consider going to your dentist and talking with him about the pain in the joints of your jaw.  A bite block may or may not help     ED Prescriptions     Medication Sig Dispense Auth. Provider   meloxicam (MOBIC) 7.5 MG tablet Take 1 tablet (7.5 mg total) by mouth daily. 15 tablet Jodeci Rini, Gwenlyn Perking, MD      PDMP not reviewed this encounter.   Barrett Henle, MD 01/06/22 [phone]

## 2022-02-23 DIAGNOSIS — E785 Hyperlipidemia, unspecified: Secondary | ICD-10-CM | POA: Diagnosis not present

## 2022-02-23 DIAGNOSIS — K5900 Constipation, unspecified: Secondary | ICD-10-CM | POA: Diagnosis not present

## 2022-02-23 DIAGNOSIS — E119 Type 2 diabetes mellitus without complications: Secondary | ICD-10-CM | POA: Diagnosis not present

## 2022-02-23 DIAGNOSIS — I10 Essential (primary) hypertension: Secondary | ICD-10-CM | POA: Diagnosis not present

## 2022-02-23 DIAGNOSIS — I159 Secondary hypertension, unspecified: Secondary | ICD-10-CM | POA: Diagnosis not present

## 2022-02-23 DIAGNOSIS — H938X3 Other specified disorders of ear, bilateral: Secondary | ICD-10-CM | POA: Diagnosis not present

## 2022-03-25 DIAGNOSIS — M26609 Unspecified temporomandibular joint disorder, unspecified side: Secondary | ICD-10-CM | POA: Diagnosis not present

## 2022-03-25 DIAGNOSIS — H938X3 Other specified disorders of ear, bilateral: Secondary | ICD-10-CM | POA: Diagnosis not present

## 2022-03-25 DIAGNOSIS — H9192 Unspecified hearing loss, left ear: Secondary | ICD-10-CM | POA: Diagnosis not present

## 2022-03-28 ENCOUNTER — Other Ambulatory Visit: Payer: Self-pay

## 2022-03-28 ENCOUNTER — Emergency Department (HOSPITAL_COMMUNITY)
Admission: EM | Admit: 2022-03-28 | Discharge: 2022-03-29 | Discharge status: Against Medical Advice | Payer: Medicare HMO | Attending: Emergency Medicine | Admitting: Emergency Medicine

## 2022-03-28 DIAGNOSIS — Z5321 Procedure and treatment not carried out due to patient leaving prior to being seen by health care provider: Secondary | ICD-10-CM | POA: Insufficient documentation

## 2022-03-28 DIAGNOSIS — Z20822 Contact with and (suspected) exposure to covid-19: Secondary | ICD-10-CM | POA: Diagnosis not present

## 2022-03-28 DIAGNOSIS — I10 Essential (primary) hypertension: Secondary | ICD-10-CM | POA: Diagnosis not present

## 2022-03-28 DIAGNOSIS — R0981 Nasal congestion: Secondary | ICD-10-CM | POA: Insufficient documentation

## 2022-03-28 DIAGNOSIS — R5381 Other malaise: Secondary | ICD-10-CM | POA: Diagnosis not present

## 2022-03-28 DIAGNOSIS — H9209 Otalgia, unspecified ear: Secondary | ICD-10-CM | POA: Diagnosis not present

## 2022-03-28 LAB — BASIC METABOLIC PANEL
Anion gap: 11 (ref 5–15)
BUN: 19 mg/dL (ref 8–23)
CO2: 25 mmol/L (ref 22–32)
Calcium: 10.1 mg/dL (ref 8.9–10.3)
Chloride: 102 mmol/L (ref 98–111)
Creatinine, Ser: 0.81 mg/dL (ref 0.44–1.00)
GFR, Estimated: >60 mL/min (ref >60)
Glucose, Bld: 154 mg/dL — ABNORMAL HIGH (ref 70–99)
Potassium: 3.5 mmol/L (ref 3.5–5.1)
Sodium: 138 mmol/L (ref 135–145)

## 2022-03-28 LAB — RESP PANEL BY RT-PCR (RSV, FLU A&B, COVID)  RVPGX2
Influenza A by PCR: NEGATIVE
Influenza B by PCR: NEGATIVE
Resp Syncytial Virus by PCR: NEGATIVE
SARS Coronavirus 2 by RT PCR: NEGATIVE

## 2022-03-28 LAB — CBC
HCT: 40.4 % (ref 36.0–46.0)
Hemoglobin: 13 g/dL (ref 12.0–15.0)
MCH: 30.5 pg (ref 26.0–34.0)
MCHC: 32.2 g/dL (ref 30.0–36.0)
MCV: 94.8 fL (ref 80.0–100.0)
Platelets: 263 10*3/uL (ref 150–400)
RBC: 4.26 MIL/uL (ref 3.87–5.11)
RDW: 12.9 % (ref 11.5–15.5)
WBC: 4.1 10*3/uL (ref 4.0–10.5)
nRBC: 0 % (ref 0.0–0.2)

## 2022-03-28 NOTE — ED Provider Triage Note (Signed)
Emergency Medicine Provider Triage Evaluation Note  Lisa Meyers , a 66 y.o. female  was evaluated in triage.  Pt complains of high blood pressure, malaise, congestion, and ear fullness.  [facility]s [facility]  She has been having chronic ear pain, ear fullness for several months, recently diagnosed with TMJ dysfunction few days ago, patient reports that she thinks it may be secondary to her blood pressure.  She denies any chest pain, fever, chills.  She denies any nausea, vomiting, vision changes. Also mentioning something about allergies.  Review of Systems  Positive: Ear fullness, high blood pressure, congestion Negative: Chest pain  Physical Exam  BP (!) 138/93 (BP Location: Right Arm)   Pulse 87   Temp 98 F (36.7 C)   Resp 17   SpO2 99%  Gen:   Awake, no distress   Resp:  Normal effort  MSK:   Moves extremities without difficulty  Other:  TMs appear normal bilaterally, patient quite anxious  Medical Decision Making  Medically screening exam initiated at 8:46 PM.  Appropriate orders placed.  Lisa Meyers was informed that the remainder of the evaluation will be completed by another provider, this initial triage assessment does not replace that evaluation, and the importance of remaining in the ED until their evaluation is complete.  Workup initiated   [name], [place] 03/28/22 2046

## 2022-03-28 NOTE — ED Triage Notes (Signed)
Patient reports elevated blood pressure at home BP= 144/100 this evening , she adds tinnitus at left ear .

## 2022-03-29 NOTE — ED Notes (Signed)
Patient LWBS, despite being advised not to.

## 2022-04-07 ENCOUNTER — Telehealth: Payer: Self-pay

## 2022-04-07 NOTE — Telephone Encounter (Signed)
        Patient  visited [place] on 12/18    Telephone encounter attempt :  1st  A HIPAA compliant voice message was left requesting a return call.  Instructed patient to call back    Monserath Neff [facility] Guide, [place], Care Management  [phone] [address], [place], [place] [address] Phone: [phone] Email: [email or web address]       

## 2022-04-08 ENCOUNTER — Telehealth: Payer: Self-pay

## 2022-04-08 NOTE — Telephone Encounter (Signed)
        Patient  visited [name] on 12/18     Telephone encounter attempt :  2nd  A HIPAA compliant voice message was left requesting a return call.  Instructed patient to call back     Eithen Castiglia [facility] Care Guide, [place], Care Management  [phone] [address], [place], [place] [address] Phone: [phone] Email: [email or web address]       

## 2022-05-03 DIAGNOSIS — Z23 Encounter for immunization: Secondary | ICD-10-CM | POA: Diagnosis not present

## 2022-05-03 DIAGNOSIS — I10 Essential (primary) hypertension: Secondary | ICD-10-CM | POA: Diagnosis not present

## 2022-05-03 DIAGNOSIS — H938X2 Other specified disorders of left ear: Secondary | ICD-10-CM | POA: Diagnosis not present

## 2022-05-03 DIAGNOSIS — E119 Type 2 diabetes mellitus without complications: Secondary | ICD-10-CM | POA: Diagnosis not present

## 2022-05-05 ENCOUNTER — Emergency Department (HOSPITAL_COMMUNITY)
Admission: EM | Admit: 2022-05-05 | Discharge: 2022-05-05 | Discharge status: Against Medical Advice | Payer: Medicare HMO | Attending: Emergency Medicine | Admitting: Emergency Medicine

## 2022-05-05 DIAGNOSIS — R030 Elevated blood-pressure reading, without diagnosis of hypertension: Secondary | ICD-10-CM | POA: Insufficient documentation

## 2022-05-05 DIAGNOSIS — Z5321 Procedure and treatment not carried out due to patient leaving prior to being seen by health care provider: Secondary | ICD-10-CM | POA: Insufficient documentation

## 2022-05-10 ENCOUNTER — Other Ambulatory Visit: Payer: Self-pay

## 2022-05-10 ENCOUNTER — Emergency Department (HOSPITAL_COMMUNITY)
Admission: EM | Admit: 2022-05-10 | Discharge: 2022-05-10 | Discharge status: Against Medical Advice | Payer: Medicare HMO | Attending: Emergency Medicine | Admitting: Emergency Medicine

## 2022-05-10 DIAGNOSIS — Z5321 Procedure and treatment not carried out due to patient leaving prior to being seen by health care provider: Secondary | ICD-10-CM | POA: Diagnosis not present

## 2022-05-10 DIAGNOSIS — H9202 Otalgia, left ear: Secondary | ICD-10-CM | POA: Insufficient documentation

## 2022-05-10 DIAGNOSIS — I10 Essential (primary) hypertension: Secondary | ICD-10-CM | POA: Diagnosis not present

## 2022-05-10 NOTE — ED Provider Triage Note (Signed)
Emergency Medicine Provider Triage Evaluation Note  Lisa Meyers , a 67 y.o. female  was evaluated in triage.  Pt complains of left ear pain. She states that same has been persistent in nature for the past 6 months.  States she has seen multiple people previously who have diagnosed her with TMJ.  She denies any trouble hearing.  States that her pain is actually anterior to her ear in front of her tragus. She denies fevers or chills. Also is concerned that [facility].  Endorses history of high blood pressure and says she has not been taking her medication as prescribed. Denies chest pain, shortness of breath, nausea, or vomiting.  Review of Systems  Positive:  Negative:   Physical Exam  BP (!) 154/94 (BP Location: Left Arm)   Pulse 73   Temp 97.8 F (36.6 C) (Oral)   Resp 16   SpO2 100%  Gen:   Awake, no distress   Resp:  Normal effort  MSK:   Moves extremities without difficulty  Other:  Tenderness over the TMJ area of the left ear. No crepitus or trismus. Inner ear non-infectious. Hearing grossly intact  Medical Decision Making  Medically screening exam initiated at 11:10 PM.  Appropriate orders placed.  Lisa Meyers was informed that the remainder of the evaluation will be completed by another provider, this initial triage assessment does not replace that evaluation, and the importance of remaining in the ED until their evaluation is complete.  Patient refusing labs at this time   Nestor Lewandowsky 05/10/22 2316

## 2022-05-10 NOTE — ED Triage Notes (Signed)
[facility] BP = 160/100 . Patient also reported bilateral ear ache this week.

## 2022-05-18 ENCOUNTER — Other Ambulatory Visit: Payer: Self-pay

## 2022-05-18 ENCOUNTER — Emergency Department (HOSPITAL_COMMUNITY)
Admission: EM | Admit: 2022-05-18 | Discharge: 2022-05-19 | Discharge status: Against Medical Advice | Payer: Medicare HMO | Attending: Emergency Medicine | Admitting: Emergency Medicine

## 2022-05-18 DIAGNOSIS — Z5321 Procedure and treatment not carried out due to patient leaving prior to being seen by health care provider: Secondary | ICD-10-CM | POA: Insufficient documentation

## 2022-05-18 DIAGNOSIS — R030 Elevated blood-pressure reading, without diagnosis of hypertension: Secondary | ICD-10-CM | POA: Diagnosis not present

## 2022-05-18 NOTE — ED Triage Notes (Signed)
Patient reports elevated blood pressure this evening BP=200/100.

## 2022-05-18 NOTE — ED Notes (Addendum)
Pt gave this RN her pt stickers and stated she is leaving.

## 2022-05-28 ENCOUNTER — Telehealth: Payer: Self-pay

## 2022-05-28 NOTE — Telephone Encounter (Signed)
        Patient  visited [place] on 2/6    Telephone encounter attempt :  1st  A HIPAA compliant voice message was left requesting a return call.  Instructed patient to call back   [place] [phone] [address], [place], [place] [address] Phone: [phone] Email: [email or web address]

## 2022-05-31 ENCOUNTER — Telehealth: Payer: Self-pay

## 2022-05-31 NOTE — Telephone Encounter (Signed)
        Patient  visited [place] on 2/7     Telephone encounter attempt :  2nd  A HIPAA compliant voice message was left requesting a return call.  Instructed patient to call back .   [place] [phone] [address], [place], [place] [address] Phone: [phone] Email: [email or web address]

## 2022-07-13 DIAGNOSIS — H52221 Regular astigmatism, right eye: Secondary | ICD-10-CM | POA: Diagnosis not present

## 2022-07-13 DIAGNOSIS — H5212 Myopia, left eye: Secondary | ICD-10-CM | POA: Diagnosis not present

## 2022-07-13 DIAGNOSIS — H2513 Age-related nuclear cataract, bilateral: Secondary | ICD-10-CM | POA: Diagnosis not present

## 2022-07-13 DIAGNOSIS — H524 Presbyopia: Secondary | ICD-10-CM | POA: Diagnosis not present

## 2022-07-13 DIAGNOSIS — E119 Type 2 diabetes mellitus without complications: Secondary | ICD-10-CM | POA: Diagnosis not present

## 2022-07-13 DIAGNOSIS — H5201 Hypermetropia, right eye: Secondary | ICD-10-CM | POA: Diagnosis not present

## 2022-07-13 DIAGNOSIS — Z135 Encounter for screening for eye and ear disorders: Secondary | ICD-10-CM | POA: Diagnosis not present

## 2022-07-13 DIAGNOSIS — H52222 Regular astigmatism, left eye: Secondary | ICD-10-CM | POA: Diagnosis not present

## 2022-07-15 DIAGNOSIS — E782 Mixed hyperlipidemia: Secondary | ICD-10-CM | POA: Diagnosis not present

## 2022-07-15 DIAGNOSIS — E119 Type 2 diabetes mellitus without complications: Secondary | ICD-10-CM | POA: Diagnosis not present

## 2022-07-15 DIAGNOSIS — J310 Chronic rhinitis: Secondary | ICD-10-CM | POA: Diagnosis not present

## 2022-07-15 DIAGNOSIS — I10 Essential (primary) hypertension: Secondary | ICD-10-CM | POA: Diagnosis not present

## 2022-08-09 ENCOUNTER — Encounter: Payer: Medicare HMO | Admitting: Internal Medicine

## 2022-09-03 DIAGNOSIS — H903 Sensorineural hearing loss, bilateral: Secondary | ICD-10-CM | POA: Diagnosis not present

## 2022-09-03 DIAGNOSIS — H9202 Otalgia, left ear: Secondary | ICD-10-CM | POA: Diagnosis not present

## 2022-09-03 DIAGNOSIS — H9313 Tinnitus, bilateral: Secondary | ICD-10-CM | POA: Diagnosis not present

## 2022-09-22 DIAGNOSIS — H9202 Otalgia, left ear: Secondary | ICD-10-CM | POA: Diagnosis not present

## 2022-09-30 DIAGNOSIS — H938X3 Other specified disorders of ear, bilateral: Secondary | ICD-10-CM | POA: Diagnosis not present

## 2022-09-30 DIAGNOSIS — H9042 Sensorineural hearing loss, unilateral, left ear, with unrestricted hearing on the contralateral side: Secondary | ICD-10-CM | POA: Diagnosis not present

## 2022-09-30 DIAGNOSIS — M26622 Arthralgia of left temporomandibular joint: Secondary | ICD-10-CM | POA: Diagnosis not present

## 2022-10-05 DIAGNOSIS — R102 Pelvic and perineal pain: Secondary | ICD-10-CM | POA: Diagnosis not present

## 2022-10-05 DIAGNOSIS — R634 Abnormal weight loss: Secondary | ICD-10-CM | POA: Diagnosis not present

## 2022-10-05 DIAGNOSIS — I10 Essential (primary) hypertension: Secondary | ICD-10-CM | POA: Diagnosis not present

## 2022-10-05 DIAGNOSIS — E119 Type 2 diabetes mellitus without complications: Secondary | ICD-10-CM | POA: Diagnosis not present

## 2022-10-05 DIAGNOSIS — H938X3 Other specified disorders of ear, bilateral: Secondary | ICD-10-CM | POA: Diagnosis not present

## 2022-10-05 DIAGNOSIS — J310 Chronic rhinitis: Secondary | ICD-10-CM | POA: Diagnosis not present

## 2022-10-31 ENCOUNTER — Encounter (HOSPITAL_COMMUNITY): Payer: Self-pay

## 2022-10-31 ENCOUNTER — Emergency Department (HOSPITAL_COMMUNITY)
Admission: EM | Admit: 2022-10-31 | Discharge: 2022-11-01 | Disposition: A | Discharge status: Home / Self Care | Payer: Medicare PPO | Attending: Emergency Medicine | Admitting: Emergency Medicine

## 2022-10-31 DIAGNOSIS — H9202 Otalgia, left ear: Secondary | ICD-10-CM

## 2022-10-31 DIAGNOSIS — K439 Ventral hernia without obstruction or gangrene: Secondary | ICD-10-CM

## 2022-10-31 NOTE — ED Triage Notes (Signed)
Pt comes by [facility] from home for bilateral ear fullness x 3 years with worsening recently. She has seen her doctor and dentist and her doctor diagnosed her with TMJ. Pt states that she came tonight because she thinks she needs a CT.

## 2022-11-01 ENCOUNTER — Other Ambulatory Visit: Payer: Self-pay | Admitting: Otolaryngology

## 2022-11-01 ENCOUNTER — Telehealth: Payer: Self-pay

## 2022-11-01 DIAGNOSIS — H9313 Tinnitus, bilateral: Secondary | ICD-10-CM

## 2022-11-01 NOTE — ED Provider Notes (Signed)
[place] [facility] EMERGENCY DEPARTMENT AT [facility] Provider Note   CSN: [id number] Arrival date & time: 10/31/22  2314     History  Chief Complaint  Patient presents with   Ear Fullness    Lisa Meyers is a 67 y.o. female.  67 year old female presents with complaint of left ear fullness and feeling like her ear is about to burst.  Reports this has been a chronic problem for at least the past year.  Patient has been seen by multiple ENT offices and told that there is no problem with her ear.  She did follow-up with a dentist who told her that she might need surgery and given order for a CT scan.  Patient has been in communication with her PCP office regarding getting the CT done however she has not heard back from the office yet and presents to the ER inquiring if she can obtain the CT here.  Review of records at bedside with patient, this appears to be a specific order, the PCP office is working with the patient to determine where she can complete the CT order provided by the dentist.       Home Medications Prior to Admission medications   Medication Sig Start Date End Date Taking? Authorizing Provider  Ascorbic Acid (VITAMIN C) 100 MG tablet Take 100 mg by mouth daily.    [provider]  blood glucose meter kit and supplies Dispense based on patient and insurance preference. Check blood sugar once daily. 06/06/19   Wanda Plump, MD  glucose blood [facility] ULTRA) test strip Check blood sugars once daily 08/21/19   Wanda Plump, MD  hydrochlorothiazide (HYDRODIURIL) 12.5 MG tablet Take 1 tablet by mouth once daily 11/16/21   Wanda Plump, MD  lisinopril (ZESTRIL) 20 MG tablet Take 1 tablet (20 mg total) by mouth daily. 12/10/21   Wanda Plump, MD  lovastatin (MEVACOR) 20 MG tablet Take 1 tablet (20 mg total) by mouth at bedtime. 02/23/21   Copland, Gwenlyn Found, MD  meloxicam (MOBIC) 7.5 MG tablet Take 1 tablet (7.5 mg total) by mouth daily. 01/06/22   Zenia Resides, MD   metFORMIN (GLUCOPHAGE) 1000 MG tablet Take 1 tablet (1,000 mg total) by mouth daily with breakfast. 08/13/21   Wanda Plump, MD  Multiple Vitamins-Minerals (DAILY MULTIVITAMIN PO) Take by mouth.    [provider]  VITAMIN D, ERGOCALCIFEROL, PO Take by mouth.    [provider]      Allergies    Patient has no known allergies.    Review of Systems   Review of Systems Negative except as per HPI Physical Exam Updated Vital Signs BP (!) 139/99 (BP Location: Right Arm)   Pulse 88   Temp 97.9 F (36.6 C) (Oral)   Resp 18   Ht 5\' 7"  (1.702 m)   Wt 76.2 kg   SpO2 100%   BMI 26.31 kg/m  Physical Exam Vitals and nursing note reviewed.  Constitutional:      General: She is not in acute distress.    Appearance: She is well-developed. She is not diaphoretic.  HENT:     Head: Normocephalic and atraumatic.     Right Ear: Tympanic membrane, ear canal and external ear normal.     Left Ear: Tympanic membrane, ear canal and external ear normal.     Nose: Nose normal.     Mouth/Throat:     Mouth: Mucous membranes are moist.  Eyes:  Extraocular Movements: Extraocular movements intact.     Pupils: Pupils are equal, round, and reactive to light.  Pulmonary:     Effort: Pulmonary effort is normal.  Abdominal:     Palpations: Abdomen is soft.     Tenderness: There is no abdominal tenderness.     Hernia: A hernia is present. Hernia is present in the ventral area.       Comments: Ventral hernia, easily reduced   Musculoskeletal:     Cervical back: Normal range of motion and neck supple.  Skin:    General: Skin is warm and dry.     Findings: No erythema or rash.  Neurological:     Mental Status: She is alert and oriented to person, place, and time.  Psychiatric:        Behavior: Behavior normal.     ED Results / Procedures / Treatments   Labs (all labs ordered are listed, but only abnormal results are displayed) Labs Reviewed - No data to  display  EKG None  Radiology No results found.  Procedures Procedures    Medications Ordered in ED Medications - No data to display  ED Course/ Medical Decision Making/ A&P                             Medical Decision Making  67 year old female presents via EMS with complaint of left ear pain.  Her exam is unremarkable.  Patient is already been seen by dentist/oral surgery who has recommended and given an order for a CT scan to be completed outpatient.  Patient's PCP office records reviewed, office is working with patient to get this study completed.  Patient inquires about her stomach "coming apart."  On exam, she does have a small ventral hernia which is nontender and easily reduced.  Advised patient I will provide information for this and she can discuss it further with her PCP and follow-up.        Final Clinical Impression(s) / ED Diagnoses Final diagnoses:  Otalgia of left ear  Ventral hernia without obstruction or gangrene    Rx / DC Orders ED Discharge Orders     None         Jeannie Fend, PA-C 11/01/22 0047    Sabas Sous, MD 11/01/22 [phone]

## 2022-11-01 NOTE — Telephone Encounter (Signed)
Transition Care Management Unsuccessful Follow-up Telephone Call  Date of discharge and from where:  11/01/2022 [facility]  Attempts:  1st Attempt  Reason for unsuccessful TCM follow-up call:  Unable to leave message  [facility]  [facility] Population Health Community Resource Care Guide   [email or web address]  ?? [phone]   Website: [email or web address]  [email or web address]

## 2022-11-01 NOTE — Discharge Instructions (Signed)
Follow up with your dentist or PCP office in the morning to complete your CT ordered by your dentist.   Discuss your hernia with your PCP at your next visit.

## 2022-11-02 ENCOUNTER — Telehealth: Payer: Self-pay

## 2022-11-02 NOTE — Telephone Encounter (Signed)
Transition Care Management Unsuccessful Follow-up Telephone Call  Date of discharge and from where:  11/01/2022 [facility]  Attempts:  2nd Attempt  Reason for unsuccessful TCM follow-up call:  No answer/busy  Carmita Boom [facility]  [facility] Population Health Community Resource Care Guide   [email or web address]  ?? [phone]   Website: [email or web address]  [email or web address]

## 2022-12-06 DIAGNOSIS — H9203 Otalgia, bilateral: Secondary | ICD-10-CM | POA: Diagnosis not present

## 2022-12-06 DIAGNOSIS — R634 Abnormal weight loss: Secondary | ICD-10-CM | POA: Diagnosis not present

## 2022-12-06 DIAGNOSIS — H9209 Otalgia, unspecified ear: Secondary | ICD-10-CM | POA: Diagnosis not present

## 2022-12-06 DIAGNOSIS — J310 Chronic rhinitis: Secondary | ICD-10-CM | POA: Diagnosis not present

## 2022-12-06 DIAGNOSIS — F411 Generalized anxiety disorder: Secondary | ICD-10-CM | POA: Diagnosis not present

## 2023-01-24 DIAGNOSIS — H9209 Otalgia, unspecified ear: Secondary | ICD-10-CM | POA: Diagnosis not present

## 2023-01-24 DIAGNOSIS — M2669 Other specified disorders of temporomandibular joint: Secondary | ICD-10-CM | POA: Diagnosis not present

## 2023-01-24 DIAGNOSIS — Z90710 Acquired absence of both cervix and uterus: Secondary | ICD-10-CM | POA: Diagnosis not present

## 2023-01-24 DIAGNOSIS — R634 Abnormal weight loss: Secondary | ICD-10-CM | POA: Diagnosis not present

## 2023-01-25 ENCOUNTER — Other Ambulatory Visit (HOSPITAL_BASED_OUTPATIENT_CLINIC_OR_DEPARTMENT_OTHER): Payer: Self-pay | Admitting: Family Medicine

## 2023-01-25 DIAGNOSIS — Z1231 Encounter for screening mammogram for malignant neoplasm of breast: Secondary | ICD-10-CM

## 2023-02-01 ENCOUNTER — Ambulatory Visit (HOSPITAL_COMMUNITY)
Admission: EM | Admit: 2023-02-01 | Discharge: 2023-02-01 | Disposition: A | Discharge status: Home / Self Care | Payer: Medicare HMO | Attending: Family Medicine | Admitting: Family Medicine

## 2023-02-01 ENCOUNTER — Encounter (HOSPITAL_COMMUNITY): Payer: Self-pay | Admitting: *Deleted

## 2023-02-01 DIAGNOSIS — L03317 Cellulitis of buttock: Secondary | ICD-10-CM

## 2023-02-01 MED ORDER — AMOXICILLIN-POT CLAVULANATE 875-125 MG PO TABS: 1.0000 | ORAL_TABLET | Freq: Two times a day (BID) | ORAL | 0 refills | Status: AC

## 2023-02-01 NOTE — ED Provider Notes (Signed)
[facility]    CSN: [id number] Arrival date & time: 02/01/23  1616      History   Chief Complaint Chief Complaint  Patient presents with   Mass    HPI Lisa Meyers is a 67 y.o. female.   HPI   here for a lump and tenderness in her right buttock area.  She first noticed it a few days ago.  No fever or chills.  No vomiting  She is not allergic any medications.  She mentions to the staff that she needs a CT for weight loss.  On further discussion it turns out she is already had a CT and she will be seeing her primary care tomorrow.  I can see the appointment in care everywhere    Past Medical History:  Diagnosis Date   Diabetes mellitus without complication (HCC)    Hyperlipidemia 02/11/2014   Hypertension    Neck pain    PPD positive dx in the 90s    Patient Active Problem List   Diagnosis Date Noted   Ear fullness, bilateral 11/04/2021   Temporomandibular joint dysfunction 11/04/2021   Abnormal auditory perception of both ears 11/04/2021   Annual physical exam 11/26/2016   PCP NOTES >>>>>>>>>>>>> 05/31/2016   Hypertension 02/11/2014   Diabetes mellitus without complication (HCC) 02/11/2014   Hyperlipidemia 02/11/2014   PPD positive 02/11/2014    Past Surgical History:  Procedure Laterality Date   ABDOMINAL HYSTERECTOMY     abdominal, no oophorectomy   BREAST CYST EXCISION     BREAST EXCISIONAL BIOPSY Right    CYST REMOVAL LEG  07-2013   inner L thigh, Dr Vickki Muff, Claris Gower     OB History     Gravida  1   Para  1   Term      Preterm      AB      Living  1      SAB      IAB      Ectopic      Multiple      Live Births  1            Home Medications    Prior to Admission medications   Medication Sig Start Date End Date Taking? Authorizing Provider  amoxicillin-clavulanate (AUGMENTIN) 875-125 MG tablet Take 1 tablet by mouth 2 (two) times daily for 7 days. 02/01/23 02/08/23 Yes Zenia Resides, MD   hydrochlorothiazide (HYDRODIURIL) 12.5 MG tablet Take 1 tablet by mouth once daily 11/16/21  Yes Paz, Elita Quick E, MD  lisinopril (ZESTRIL) 20 MG tablet Take 1 tablet (20 mg total) by mouth daily. 12/10/21  Yes Paz, Nolon Rod, MD  Ascorbic Acid (VITAMIN C) 100 MG tablet Take 100 mg by mouth daily.    [provider]  blood glucose meter kit and supplies Dispense based on patient and insurance preference. Check blood sugar once daily. 06/06/19   Wanda Plump, MD  glucose blood [facility] ULTRA) test strip Check blood sugars once daily 08/21/19   Wanda Plump, MD  lovastatin (MEVACOR) 20 MG tablet Take 1 tablet (20 mg total) by mouth at bedtime. 02/23/21   Copland, Gwenlyn Found, MD  metFORMIN (GLUCOPHAGE) 1000 MG tablet Take 1 tablet (1,000 mg total) by mouth daily with breakfast. 08/13/21   Wanda Plump, MD  Multiple Vitamins-Minerals (DAILY MULTIVITAMIN PO) Take by mouth.    [provider]  VITAMIN D, ERGOCALCIFEROL, PO Take by mouth.    [provider]  Family History Family History  Problem Relation Age of Onset   Hypertension Mother    Hypertension Father    Heart disease Father    Diabetes Maternal Aunt    Colon cancer Neg Hx    Breast cancer Neg Hx    CAD Neg Hx     Social History Social History   Tobacco Use   Smoking status: Never   Smokeless tobacco: Never  Vaping Use   Vaping status: Never Used  Substance Use Topics   Alcohol use: Yes    Alcohol/week: 0.0 standard drinks of alcohol    Comment: wine, rare    Drug use: No     Allergies   Patient has no known allergies.   Review of Systems Review of Systems   Physical Exam Triage Vital Signs ED Triage Vitals [02/01/23 1652]  Encounter Vitals Group     BP 120/78     Systolic BP Percentile      Diastolic BP Percentile      Pulse Rate (!) 108     Resp 18     Temp 98.5 F (36.9 C)     Temp Source Oral     SpO2 96 %     Weight      Height      Head Circumference      Peak Flow      Pain Score       Pain Loc      Pain Education      Exclude from Growth Chart    No data found.  Updated Vital Signs BP 120/78 (BP Location: Right Arm)   Pulse (!) 108   Temp 98.5 F (36.9 C) (Oral)   Resp 18   SpO2 96%   Visual Acuity Right Eye Distance:   Left Eye Distance:   Bilateral Distance:    Right Eye Near:   Left Eye Near:    Bilateral Near:     Physical Exam Vitals reviewed.  Constitutional:      General: She is not in acute distress.    Appearance: She is not ill-appearing, toxic-appearing or diaphoretic.  HENT:     Mouth/Throat:     Mouth: Mucous membranes are moist.  Genitourinary:    Comments: She shows me her upper right gluteal area adjacent to the gluteal crevice.  There is induration and erythema and tenderness about 6 cm x 2 cm.  There is no fluctuance present Skin:    Coloration: Skin is not pale.  Neurological:     General: No focal deficit present.     Mental Status: She is alert and oriented to person, place, and time.  Psychiatric:        Behavior: Behavior normal.      UC Treatments / Results  Labs (all labs ordered are listed, but only abnormal results are displayed) Labs Reviewed - No data to display  EKG   Radiology No results found.  Procedures Procedures (including critical care time)  Medications Ordered in UC Medications - No data to display  Initial Impression / Assessment and Plan / UC Course  I have reviewed the triage vital signs and the nursing notes.  Pertinent labs & imaging results that were available during my care of the patient were reviewed by me and considered in my medical decision making (see chart for details).      Augmentin is sent in for the infection.  I have asked her to do warm compresses or soaks.  Encouragement  given to keep her primary care appointment for tomorrow Final Clinical Impressions(s) / UC Diagnoses   Final diagnoses:  Cellulitis of buttock     Discharge Instructions      Take  amoxicillin-clavulanate 875 mg--1 tab twice daily with food for 7 days  Do a warm sitz bath or warm compress to the sore area 2 or 3 times a day.  I am glad you are going to get to see your primary care tomorrow.     ED Prescriptions     Medication Sig Dispense Auth. Provider   amoxicillin-clavulanate (AUGMENTIN) 875-125 MG tablet Take 1 tablet by mouth 2 (two) times daily for 7 days. 14 tablet Rosbel Buckner, Janace Aris, MD      PDMP not reviewed this encounter.   Zenia Resides, MD 02/01/23 [phone]

## 2023-02-01 NOTE — Discharge Instructions (Signed)
Take amoxicillin-clavulanate 875 mg--1 tab twice daily with food for 7 days  Do a warm sitz bath or warm compress to the sore area 2 or 3 times a day.  I am glad you are going to get to see your primary care tomorrow.

## 2023-02-01 NOTE — ED Triage Notes (Signed)
Pt states she has something growing on his buttocks, she isn't sure what it is but its been painful x 2 days.   Pt is unsure of meds. Pt states she has an pcp appt tomorrow she has a lot of things wrong. She is shaking, states she needs a CT scan due to weight loss.

## 2023-02-02 DIAGNOSIS — E119 Type 2 diabetes mellitus without complications: Secondary | ICD-10-CM | POA: Diagnosis not present

## 2023-02-02 DIAGNOSIS — R634 Abnormal weight loss: Secondary | ICD-10-CM | POA: Diagnosis not present

## 2023-02-02 DIAGNOSIS — I10 Essential (primary) hypertension: Secondary | ICD-10-CM | POA: Diagnosis not present

## 2023-02-02 DIAGNOSIS — L0231 Cutaneous abscess of buttock: Secondary | ICD-10-CM | POA: Diagnosis not present

## 2023-02-02 DIAGNOSIS — F411 Generalized anxiety disorder: Secondary | ICD-10-CM | POA: Diagnosis not present

## 2023-02-09 DIAGNOSIS — H9202 Otalgia, left ear: Secondary | ICD-10-CM | POA: Diagnosis not present

## 2023-02-17 DIAGNOSIS — Z1231 Encounter for screening mammogram for malignant neoplasm of breast: Secondary | ICD-10-CM | POA: Diagnosis not present

## 2023-02-24 ENCOUNTER — Ambulatory Visit (HOSPITAL_COMMUNITY)
Admission: EM | Admit: 2023-02-24 | Discharge: 2023-02-24 | Disposition: A | Discharge status: Home / Self Care | Payer: Medicare HMO | Attending: Family Medicine | Admitting: Family Medicine

## 2023-02-24 DIAGNOSIS — R002 Palpitations: Secondary | ICD-10-CM | POA: Diagnosis not present

## 2023-02-24 DIAGNOSIS — I10 Essential (primary) hypertension: Secondary | ICD-10-CM | POA: Diagnosis not present

## 2023-02-24 DIAGNOSIS — G20C Parkinsonism, unspecified: Secondary | ICD-10-CM | POA: Diagnosis not present

## 2023-02-24 LAB — BASIC METABOLIC PANEL
Anion gap: 10 (ref 5–15)
BUN: 16 mg/dL (ref 8–23)
CO2: 28 mmol/L (ref 22–32)
Calcium: 10.6 mg/dL — ABNORMAL HIGH (ref 8.9–10.3)
Chloride: 100 mmol/L (ref 98–111)
Creatinine, Ser: 1.13 mg/dL — ABNORMAL HIGH (ref 0.44–1.00)
GFR, Estimated: 53 mL/min — ABNORMAL LOW (ref >60)
Glucose, Bld: 137 mg/dL — ABNORMAL HIGH (ref 70–99)
Potassium: 3.8 mmol/L (ref 3.5–5.1)
Sodium: 138 mmol/L (ref 135–145)

## 2023-02-24 LAB — CBC
HCT: 41.1 % (ref 36.0–46.0)
Hemoglobin: 13.4 g/dL (ref 12.0–15.0)
MCH: 29.8 pg (ref 26.0–34.0)
MCHC: 32.6 g/dL (ref 30.0–36.0)
MCV: 91.5 fL (ref 80.0–100.0)
Platelets: 207 10*3/uL (ref 150–400)
RBC: 4.49 MIL/uL (ref 3.87–5.11)
RDW: 13 % (ref 11.5–15.5)
WBC: 2.8 10*3/uL — ABNORMAL LOW (ref 4.0–10.5)
nRBC: 0 % (ref 0.0–0.2)

## 2023-02-24 LAB — TSH: TSH: 1.365 u[IU]/mL (ref 0.350–4.500)

## 2023-02-24 NOTE — Discharge Instructions (Signed)
Your EKG was normal.  Your heart rate was normal.  Your blood pressure was elevated when you were being checked in.  We have drawn blood to check your blood counts and electrolytes and thyroid function.  Our staff will call you if anything is significantly abnormal.  Please take your medicines as prescribed and not too often.  Please take your blood pressure medicines and your diabetes medicines as prescribed.  If you find you are having side effects, please speak with your primary care was prescribing these medications so they can adjust doses or see what you need to have done differently.  Please follow-up with your primary care about this tremor that is worsening

## 2023-02-24 NOTE — ED Triage Notes (Signed)
Pt states she took her meds too soon she didn't wait the 12 hours between the, last night. She denies chest pain states her blood pressure is just high.She does feel like her heart is racing.    Provider called to room

## 2023-02-24 NOTE — ED Provider Notes (Signed)
[facility]    CSN: [id number] Arrival date & time: 02/24/23  0815      History   Chief Complaint Chief Complaint  Patient presents with   Medication Reaction   Hypertension    HPI Lisa Meyers is a 67 y.o. female.    Hypertension  Here for feeling like her heart is racing and elevated blood pressure. She usually takes Celebrex and Cymbalta twice a day.  Yesterday if she took it in the morning and then she took it again at noon because she was having some extra TMJ pain.  She states then yesterday evening she began having some feeling of racing heart rate and she checked her blood pressure and it was elevated.  No chest pain and no shortness of breath.  She states she has ongoing tremor and it is worse lately.  She has a primary care.  She has not been referred to neurology.  On review of the chart I saw her about 3 weeks ago; I saw her for cellulitis of her buttock and she states that is improved.  At that time she told me she needed a CT for weight loss.  On further questioning at that visit I understood that she was going to be seeing her primary care about the weight loss.  Today she states I am confused and that she did not need a CT for weight loss.  She tells clinical staff who are checking her in that she does not always take her blood pressure medicines and she is quit taking her diabetes medicines because of some blurry vision.   Past Medical History:  Diagnosis Date   Diabetes mellitus without complication (HCC)    Hyperlipidemia 02/11/2014   Hypertension    Neck pain    PPD positive dx in the 90s    Patient Active Problem List   Diagnosis Date Noted   Ear fullness, bilateral 11/04/2021   Temporomandibular joint dysfunction 11/04/2021   Abnormal auditory perception of both ears 11/04/2021   Annual physical exam 11/26/2016   PCP NOTES >>>>>>>>>>>>> 05/31/2016   Hypertension 02/11/2014   Diabetes mellitus without complication (HCC) 02/11/2014    Hyperlipidemia 02/11/2014   PPD positive 02/11/2014    Past Surgical History:  Procedure Laterality Date   ABDOMINAL HYSTERECTOMY     abdominal, no oophorectomy   BREAST CYST EXCISION     BREAST EXCISIONAL BIOPSY Right    CYST REMOVAL LEG  07-2013   inner L thigh, Dr Vickki Muff, Claris Gower     OB History     Gravida  1   Para  1   Term      Preterm      AB      Living  1      SAB      IAB      Ectopic      Multiple      Live Births  1            Home Medications    Prior to Admission medications   Medication Sig Start Date End Date Taking? Authorizing Provider  Ascorbic Acid (VITAMIN C) 100 MG tablet Take 100 mg by mouth daily.   Yes [provider]  celecoxib (CELEBREX) 200 MG capsule Take 200 mg by mouth daily. 11/20/22  Yes [provider]  DULoxetine (CYMBALTA) 30 MG capsule Take by mouth. 02/02/23  Yes [provider]  FARXIGA 5 MG TABS tablet Take 5 mg by mouth daily.  Yes [provider]  hydrochlorothiazide (HYDRODIURIL) 12.5 MG tablet Take 1 tablet by mouth once daily 11/16/21  Yes Paz, Elita Quick E, MD  lisinopril (ZESTRIL) 20 MG tablet Take 1 tablet (20 mg total) by mouth daily. 12/10/21  Yes Paz, Nolon Rod, MD  loratadine (CLARITIN) 10 MG tablet Take by mouth. 07/15/22  Yes [provider]  lovastatin (MEVACOR) 20 MG tablet Take 1 tablet (20 mg total) by mouth at bedtime. 02/23/21  Yes Copland, Gwenlyn Found, MD  metFORMIN (GLUCOPHAGE) 1000 MG tablet Take 1 tablet (1,000 mg total) by mouth daily with breakfast. 08/13/21  Yes Wanda Plump, MD  Multiple Vitamins-Minerals (DAILY MULTIVITAMIN PO) Take by mouth.   Yes [provider]  pioglitazone (ACTOS) 45 MG tablet Take by mouth. 05/21/22  Yes [provider]  rosuvastatin (CRESTOR) 20 MG tablet Take 20 mg by mouth daily.   Yes [provider]  VITAMIN D, ERGOCALCIFEROL, PO Take by mouth.   Yes [provider]  blood glucose meter kit and  supplies Dispense based on patient and insurance preference. Check blood sugar once daily. 06/06/19   Wanda Plump, MD  glucose blood [facility] ULTRA) test strip Check blood sugars once daily 08/21/19   Wanda Plump, MD    Family History Family History  Problem Relation Age of Onset   Hypertension Mother    Hypertension Father    Heart disease Father    Diabetes Maternal Aunt    Colon cancer Neg Hx    Breast cancer Neg Hx    CAD Neg Hx     Social History Social History   Tobacco Use   Smoking status: Never   Smokeless tobacco: Never  Vaping Use   Vaping status: Never Used  Substance Use Topics   Alcohol use: Yes    Alcohol/week: 0.0 standard drinks of alcohol    Comment: wine, rare    Drug use: No     Allergies   Patient has no known allergies.   Review of Systems Review of Systems   Physical Exam Triage Vital Signs ED Triage Vitals [02/24/23 0838]  Encounter Vitals Group     BP      Systolic BP Percentile      Diastolic BP Percentile      Pulse      Resp      Temp      Temp src      SpO2      Weight      Height      Head Circumference      Peak Flow      Pain Score 0     Pain Loc      Pain Education      Exclude from Growth Chart    No data found.  Updated Vital Signs BP (!) 142/106 (BP Location: Left Arm)   Pulse 95 Comment: EKG  Temp 98.3 F (36.8 C) (Oral)   Resp 18   Visual Acuity Right Eye Distance:   Left Eye Distance:   Bilateral Distance:    Right Eye Near:   Left Eye Near:    Bilateral Near:     Physical Exam Vitals reviewed.  Constitutional:      General: She is not in acute distress.    Appearance: She is not ill-appearing, toxic-appearing or diaphoretic.     Comments: Initially her heart rate on the pulse ox is 200.  This is due to a coarse resting tremor of her  extremities.  She is not in any respiratory distress and speaks clearly.  HENT:     Mouth/Throat:     Mouth: Mucous membranes are moist.  Eyes:     Extraocular  Movements: Extraocular movements intact.     Conjunctiva/sclera: Conjunctivae normal.     Pupils: Pupils are equal, round, and reactive to light.  Cardiovascular:     Rate and Rhythm: Normal rate and regular rhythm.     Heart sounds: No murmur heard. Pulmonary:     Effort: Pulmonary effort is normal. No respiratory distress.     Breath sounds: No stridor. No wheezing, rhonchi or rales.  Chest:     Chest wall: No tenderness.  Musculoskeletal:     Cervical back: Neck supple.  Lymphadenopathy:     Cervical: No cervical adenopathy.  Skin:    Coloration: Skin is not jaundiced or pale.  Neurological:     General: No focal deficit present.     Mental Status: She is alert and oriented to person, place, and time.     Comments: She has a pronounced course resting tremor of her hands and feet.  Psychiatric:        Behavior: Behavior normal.      UC Treatments / Results  Labs (all labs ordered are listed, but only abnormal results are displayed) Labs Reviewed  CBC  BASIC METABOLIC PANEL  TSH    EKG   Radiology No results found.  Procedures Procedures (including critical care time)  Medications Ordered in UC Medications - No data to display  Initial Impression / Assessment and Plan / UC Course  I have reviewed the triage vital signs and the nursing notes.  Pertinent labs & imaging results that were available during my care of the patient were reviewed by me and considered in my medical decision making (see chart for details).    On exam she has a normal hr. EKG is normal. Some of these symptoms are possibly in part due to anxiety about her taking her meds too close together.  CBC, bmp are drawn today. We are asking her to f/u with her pcp, and to take medicines as prescribed. Final Clinical Impressions(s) / UC Diagnoses   Final diagnoses:  Palpitations  Essential hypertension  Parkinsonian tremor (HCC)     Discharge Instructions      Your EKG was  normal.  Your heart rate was normal.  Your blood pressure was elevated when you were being checked in.  We have drawn blood to check your blood counts and electrolytes and thyroid function.  Our staff will call you if anything is significantly abnormal.  Please take your medicines as prescribed and not too often.  Please take your blood pressure medicines and your diabetes medicines as prescribed.  If you find you are having side effects, please speak with your primary care was prescribing these medications so they can adjust doses or see what you need to have done differently.  Please follow-up with your primary care about this tremor that is worsening     ED Prescriptions   None    PDMP not reviewed this encounter.   Zenia Resides, MD 02/24/23 [phone]

## 2023-03-04 ENCOUNTER — Emergency Department (HOSPITAL_COMMUNITY)
Admission: EM | Admit: 2023-03-04 | Discharge: 2023-03-04 | Disposition: A | Discharge status: Home / Self Care | Payer: Medicare HMO | Attending: Emergency Medicine | Admitting: Emergency Medicine

## 2023-03-04 ENCOUNTER — Other Ambulatory Visit: Payer: Self-pay

## 2023-03-04 ENCOUNTER — Encounter (HOSPITAL_COMMUNITY): Payer: Self-pay

## 2023-03-04 DIAGNOSIS — F419 Anxiety disorder, unspecified: Secondary | ICD-10-CM | POA: Insufficient documentation

## 2023-03-04 DIAGNOSIS — R030 Elevated blood-pressure reading, without diagnosis of hypertension: Secondary | ICD-10-CM | POA: Diagnosis not present

## 2023-03-04 DIAGNOSIS — R251 Tremor, unspecified: Secondary | ICD-10-CM | POA: Diagnosis not present

## 2023-03-04 DIAGNOSIS — I10 Essential (primary) hypertension: Secondary | ICD-10-CM | POA: Diagnosis not present

## 2023-03-04 LAB — CBC
HCT: 39.8 % (ref 36.0–46.0)
Hemoglobin: 12.9 g/dL (ref 12.0–15.0)
MCH: 29.8 pg (ref 26.0–34.0)
MCHC: 32.4 g/dL (ref 30.0–36.0)
MCV: 91.9 fL (ref 80.0–100.0)
Platelets: 180 10*3/uL (ref 150–400)
RBC: 4.33 MIL/uL (ref 3.87–5.11)
RDW: 13.2 % (ref 11.5–15.5)
WBC: 3.9 10*3/uL — ABNORMAL LOW (ref 4.0–10.5)
nRBC: 0 % (ref 0.0–0.2)

## 2023-03-04 LAB — BASIC METABOLIC PANEL
Anion gap: 14 (ref 5–15)
BUN: 23 mg/dL (ref 8–23)
CO2: 21 mmol/L — ABNORMAL LOW (ref 22–32)
Calcium: 10.4 mg/dL — ABNORMAL HIGH (ref 8.9–10.3)
Chloride: 102 mmol/L (ref 98–111)
Creatinine, Ser: 0.82 mg/dL (ref 0.44–1.00)
GFR, Estimated: >60 mL/min (ref >60)
Glucose, Bld: 146 mg/dL — ABNORMAL HIGH (ref 70–99)
Potassium: 3.4 mmol/L — ABNORMAL LOW (ref 3.5–5.1)
Sodium: 137 mmol/L (ref 135–145)

## 2023-03-04 NOTE — ED Triage Notes (Signed)
Says she went to bed early because TV was not working so she woke up early.   Upon waking up she say she was hypertensive with sbp of 160.   Has a tremor which she says is baseline.   Requesting EKG and medicine.

## 2023-03-04 NOTE — ED Provider Notes (Signed)
[facility] Emergency Department Provider Note MRN:  [id number]  Arrival date & time: 03/04/23     Chief Complaint   Hypertension   History of Present Illness   Lisa Meyers is a 67 y.o. year-old female presents to the ED with chief complaint of elevated blood pressure reading.  She states that she measured her BP today at home and it was high.  She states that her son is in jail and whenever he calls with his problems, it causes her BP to be high.  She states that she is feeling better now.  She states that anytime she comes to the hospital to have the BP checked, it always improves.   She reports hx of tremor, which is not new.  She doesn't see a neurologist, but asks for a referral..  History provided by patient.   Review of Systems  Pertinent positive and negative review of systems noted in HPI.    Physical Exam   Vitals:   03/04/23 0100 03/04/23 0421  BP: (!) 150/84 136/85  Pulse: 98 85  Resp: 20 17  Temp: 97.6 F (36.4 C) 97.9 F (36.6 C)  SpO2: 93% 100%    CONSTITUTIONAL:  non toxic-appearing, NAD NEURO:  Alert and oriented x 3, CN 3-12 grossly intact, baseline tremor EYES:  eyes equal and reactive ENT/NECK:  Supple, no stridor  CARDIO:  normal rate, regular rhythm, appears well-perfused  PULM:  No respiratory distress,  GI/GU:  non-distended,  MSK/SPINE:  No gross deformities, no edema, moves all extremities  SKIN:  no rash, atraumatic   *Additional and/or pertinent findings included in MDM below  Diagnostic and Interventional Summary    EKG Interpretation Date/Time:  Friday March 04 2023 01:05:45 EST Ventricular Rate:  97 PR Interval:  162 QRS Duration:  64 QT Interval:  352 QTC Calculation: 447 R Axis:   -31  Text Interpretation: Normal sinus rhythm Left axis deviation Low voltage QRS Cannot rule out Anterior infarct , age undetermined Abnormal ECG When compared with ECG of 24-Feb-2023 08:36, PREVIOUS ECG IS PRESENT  Confirmed by Tilden Fossa [phone]) on 03/04/2023 4:25:50 AM       Labs Reviewed  CBC - Abnormal; Notable for the following components:      Result Value   WBC 3.9 (*)    All other components within normal limits  BASIC METABOLIC PANEL - Abnormal; Notable for the following components:   Potassium 3.4 (*)    CO2 21 (*)    Glucose, Bld 146 (*)    Calcium 10.4 (*)    All other components within normal limits    No orders to display    Medications - No data to display   Procedures  /  Critical Care Procedures  ED Course and Medical Decision Making  I have reviewed the triage vital signs, the nursing notes, and pertinent available records from the EMR.  Social Determinants Affecting Complexity of Care: Patient has no clinically significant social determinants affecting this chief complaint..   ED Course:    Medical Decision Making Patient here with elevated BP reading at home.  Now resolved.  States that her BP goes up on the days that she talks to her son, who is in jail.  BP now is 136/85.  She denies having any other new complaints.  She states that she is ready to go home.  Amount and/or Complexity of Data Reviewed Labs: ordered.         Consultants: No  consultations were needed in caring for this patient.   Treatment and Plan: I considered admission due to patient's initial presentation, but after considering the examination and diagnostic results, patient will not require admission and can be discharged with outpatient follow-up.    Final Clinical Impressions(s) / ED Diagnoses     ICD-10-CM   1. Anxiety  F41.9     2. Elevated blood pressure reading  R03.0     3. Tremor  R25.1 Ambulatory referral to Neurology      ED Discharge Orders          Ordered    Ambulatory referral to Neurology       Comments: An appointment is requested in approximately: 2 weeks   03/04/23 0441              Discharge Instructions Discussed with and Provided to  Patient:   Discharge Instructions   None      Roxy Horseman, PA-C 03/04/23 0447    Tilden Fossa, MD 03/04/23 [phone]

## 2023-04-12 DIAGNOSIS — E119 Type 2 diabetes mellitus without complications: Secondary | ICD-10-CM | POA: Diagnosis not present

## 2023-04-12 DIAGNOSIS — M26609 Unspecified temporomandibular joint disorder, unspecified side: Secondary | ICD-10-CM | POA: Diagnosis not present

## 2023-04-12 DIAGNOSIS — R251 Tremor, unspecified: Secondary | ICD-10-CM | POA: Diagnosis not present

## 2023-04-28 DIAGNOSIS — R251 Tremor, unspecified: Secondary | ICD-10-CM | POA: Diagnosis not present

## 2023-05-16 DIAGNOSIS — R251 Tremor, unspecified: Secondary | ICD-10-CM | POA: Diagnosis not present

## 2023-05-17 DIAGNOSIS — M542 Cervicalgia: Secondary | ICD-10-CM | POA: Diagnosis not present

## 2023-05-17 DIAGNOSIS — R634 Abnormal weight loss: Secondary | ICD-10-CM | POA: Diagnosis not present

## 2023-05-17 DIAGNOSIS — R9389 Abnormal findings on diagnostic imaging of other specified body structures: Secondary | ICD-10-CM | POA: Diagnosis not present

## 2023-05-17 DIAGNOSIS — G8929 Other chronic pain: Secondary | ICD-10-CM | POA: Diagnosis not present

## 2023-05-17 DIAGNOSIS — M545 Low back pain, unspecified: Secondary | ICD-10-CM | POA: Diagnosis not present

## 2023-06-14 ENCOUNTER — Ambulatory Visit (HOSPITAL_COMMUNITY)
Admission: EM | Admit: 2023-06-14 | Discharge: 2023-06-14 | Disposition: A | Discharge status: Home / Self Care | Attending: Family Medicine | Admitting: Family Medicine

## 2023-06-14 ENCOUNTER — Encounter (HOSPITAL_COMMUNITY): Payer: Self-pay

## 2023-06-14 DIAGNOSIS — T50905A Adverse effect of unspecified drugs, medicaments and biological substances, initial encounter: Secondary | ICD-10-CM | POA: Diagnosis not present

## 2023-06-14 MED ORDER — FLUOXETINE HCL 10 MG PO TABS: 10.0000 mg | ORAL_TABLET | Freq: Every day | ORAL | 1 refills | Status: DC

## 2023-06-14 NOTE — Discharge Instructions (Signed)
 Keep your appointment with your new primary provider on 06/25/23.

## 2023-06-14 NOTE — ED Triage Notes (Signed)
 Pt states told her PCP that cymbalta gives her tremors and rosuvastatin causes muscle tightness and effects her walking and he said continue and now he is out of office for 2 months. States tried to stop it but then can't do anything. States took one today and shaking.

## 2023-06-14 NOTE — ED Provider Notes (Signed)
 [facility]   [id number] 06/14/23 Arrival Time: 1020  ASSESSMENT & PLAN:  1. Adverse effect of drug/medicinal, initial encounter    Feels Cymbalta is making her feel bad. Has appt with new PCP on 3/15. Wished to try alternative medication.  Meds ordered this encounter  Medications   FLUoxetine (PROZAC) 10 MG tablet    Sig: Take 1 tablet (10 mg total) by mouth daily.    Dispense:  30 tablet    Refill:  1     Discharge Instructions      Keep your appointment with your new primary provider on 06/25/23.      Reviewed expectations re: course of current medical issues. Questions answered. Outlined signs and symptoms indicating need for more acute intervention. Understanding verbalized. After Visit Summary given.   SUBJECTIVE: History from: Patient. Lisa Meyers is a 68 y.o. female. Reports "feeling bad" and feels her Cymbalta is causing this. Has tried to talk to previous medical provider but states that he is out of office for next 2 months. Here for advice. Wishes to try alternative to Cymbalta.   OBJECTIVE:  Vitals:   06/14/23 1059  BP: (!) 138/90  Pulse: 69  Resp: 20  Temp: 97.7 F (36.5 C)  TempSrc: Oral  SpO2: 98%    General appearance: alert; no distress Eyes: PERRLA; EOMI; conjunctiva normal CV: RRR Lungs: speaks full sentences without difficulty; unlabored Skin: warm and dry Neurologic: normal gait Psychological: alert and cooperative; normal mood and affect   No Known Allergies  Past Medical History:  Diagnosis Date   Diabetes mellitus without complication (HCC)    Hyperlipidemia 02/11/2014   Hypertension    Neck pain    PPD positive dx in the 90s   Social History   Socioeconomic History   Marital status: Single    Spouse name: Not on file   Number of children: 1   Years of education: Not on file   Highest education level: Not on file  Occupational History   Occupation: CNA -- [employer]   Tobacco Use   Smoking status:  Never   Smokeless tobacco: Never  Vaping Use   Vaping status: Never Used  Substance and Sexual Activity   Alcohol use: Yes    Alcohol/week: 0.0 standard drinks of alcohol    Comment: wine, rare    Drug use: No   Sexual activity: Not Currently  Other Topics Concern   Not on file  Social History Narrative   Born in [place]   Live in [place] x years, then [place], moved to [place] 2015   Lives by herself   Social Drivers of Corporate investment banker Strain: Not on [employer] Insecurity: Not on file  Transportation Needs: Not on file  Physical Activity: Not on file  Stress: Not on file  Social Connections: Not on file  Intimate Partner Violence: Not on file   Family History  Problem Relation Age of Onset   Hypertension Mother    Hypertension Father    Heart disease Father    Diabetes Maternal Aunt    Colon cancer Neg Hx    Breast cancer Neg Hx    CAD Neg Hx    Past Surgical History:  Procedure Laterality Date   ABDOMINAL HYSTERECTOMY     abdominal, no oophorectomy   BREAST CYST EXCISION     BREAST EXCISIONAL BIOPSY Right    CYST REMOVAL LEG  07-2013   inner L thigh, Dr Vickki Muff, Claris Gower  Mardella Layman, MD 06/14/23 [phone]

## 2023-06-27 DIAGNOSIS — I10 Essential (primary) hypertension: Secondary | ICD-10-CM | POA: Diagnosis not present

## 2023-06-27 DIAGNOSIS — E119 Type 2 diabetes mellitus without complications: Secondary | ICD-10-CM | POA: Diagnosis not present

## 2023-06-27 DIAGNOSIS — Z79899 Other long term (current) drug therapy: Secondary | ICD-10-CM | POA: Diagnosis not present

## 2023-06-27 DIAGNOSIS — M7989 Other specified soft tissue disorders: Secondary | ICD-10-CM | POA: Diagnosis not present

## 2023-06-27 DIAGNOSIS — M19049 Primary osteoarthritis, unspecified hand: Secondary | ICD-10-CM | POA: Diagnosis not present

## 2023-06-27 DIAGNOSIS — E782 Mixed hyperlipidemia: Secondary | ICD-10-CM | POA: Diagnosis not present

## 2023-07-08 DIAGNOSIS — R634 Abnormal weight loss: Secondary | ICD-10-CM | POA: Diagnosis not present

## 2023-07-08 DIAGNOSIS — M26609 Unspecified temporomandibular joint disorder, unspecified side: Secondary | ICD-10-CM | POA: Diagnosis not present

## 2023-07-08 DIAGNOSIS — K5900 Constipation, unspecified: Secondary | ICD-10-CM | POA: Diagnosis not present

## 2023-07-08 DIAGNOSIS — E119 Type 2 diabetes mellitus without complications: Secondary | ICD-10-CM | POA: Diagnosis not present

## 2023-07-08 DIAGNOSIS — Z1211 Encounter for screening for malignant neoplasm of colon: Secondary | ICD-10-CM | POA: Diagnosis not present

## 2023-07-21 DIAGNOSIS — H25813 Combined forms of age-related cataract, bilateral: Secondary | ICD-10-CM | POA: Diagnosis not present

## 2023-08-08 ENCOUNTER — Other Ambulatory Visit: Payer: Self-pay

## 2023-08-08 ENCOUNTER — Encounter (HOSPITAL_COMMUNITY): Payer: Self-pay | Admitting: Emergency Medicine

## 2023-08-08 ENCOUNTER — Emergency Department (HOSPITAL_COMMUNITY)
Admission: EM | Admit: 2023-08-08 | Discharge: 2023-08-08 | Disposition: A | Discharge status: Home / Self Care | Attending: Emergency Medicine | Admitting: Emergency Medicine

## 2023-08-08 DIAGNOSIS — E119 Type 2 diabetes mellitus without complications: Secondary | ICD-10-CM | POA: Insufficient documentation

## 2023-08-08 DIAGNOSIS — I10 Essential (primary) hypertension: Secondary | ICD-10-CM | POA: Insufficient documentation

## 2023-08-08 DIAGNOSIS — F419 Anxiety disorder, unspecified: Secondary | ICD-10-CM | POA: Diagnosis not present

## 2023-08-08 DIAGNOSIS — F32A Depression, unspecified: Secondary | ICD-10-CM | POA: Diagnosis not present

## 2023-08-08 DIAGNOSIS — Z7984 Long term (current) use of oral hypoglycemic drugs: Secondary | ICD-10-CM | POA: Diagnosis not present

## 2023-08-08 LAB — CBC WITH DIFFERENTIAL/PLATELET
Abs Immature Granulocytes: 0.01 10*3/uL (ref 0.00–0.07)
Basophils Absolute: 0 10*3/uL (ref 0.0–0.1)
Basophils Relative: 0 %
Eosinophils Absolute: 0 10*3/uL (ref 0.0–0.5)
Eosinophils Relative: 0 %
HCT: 40.2 % (ref 36.0–46.0)
Hemoglobin: 13.1 g/dL (ref 12.0–15.0)
Immature Granulocytes: 0 %
Lymphocytes Relative: 38 %
Lymphs Abs: 1.6 10*3/uL (ref 0.7–4.0)
MCH: 30.4 pg (ref 26.0–34.0)
MCHC: 32.6 g/dL (ref 30.0–36.0)
MCV: 93.3 fL (ref 80.0–100.0)
Monocytes Absolute: 0.4 10*3/uL (ref 0.1–1.0)
Monocytes Relative: 9 %
Neutro Abs: 2.2 10*3/uL (ref 1.7–7.7)
Neutrophils Relative %: 53 %
Platelets: 226 10*3/uL (ref 150–400)
RBC: 4.31 MIL/uL (ref 3.87–5.11)
RDW: 13.5 % (ref 11.5–15.5)
WBC: 4.1 10*3/uL (ref 4.0–10.5)
nRBC: 0 % (ref 0.0–0.2)

## 2023-08-08 LAB — COMPREHENSIVE METABOLIC PANEL WITH GFR
ALT: 13 U/L (ref 0–44)
AST: 16 U/L (ref 15–41)
Albumin: 3.9 g/dL (ref 3.5–5.0)
Alkaline Phosphatase: 57 U/L (ref 38–126)
Anion gap: 9 (ref 5–15)
BUN: 28 mg/dL — ABNORMAL HIGH (ref 8–23)
CO2: 25 mmol/L (ref 22–32)
Calcium: 10.3 mg/dL (ref 8.9–10.3)
Chloride: 105 mmol/L (ref 98–111)
Creatinine, Ser: 0.78 mg/dL (ref 0.44–1.00)
GFR, Estimated: >60 mL/min (ref >60)
Glucose, Bld: 129 mg/dL — ABNORMAL HIGH (ref 70–99)
Potassium: 3.7 mmol/L (ref 3.5–5.1)
Sodium: 139 mmol/L (ref 135–145)
Total Bilirubin: 0.6 mg/dL (ref 0.0–1.2)
Total Protein: 7.2 g/dL (ref 6.5–8.1)

## 2023-08-08 LAB — URINALYSIS, ROUTINE W REFLEX MICROSCOPIC
Bacteria, UA: NONE SEEN
Bilirubin Urine: NEGATIVE
Glucose, UA: >=500 mg/dL — AB
Hgb urine dipstick: NEGATIVE
Ketones, ur: 20 mg/dL — AB
Leukocytes,Ua: NEGATIVE
Nitrite: NEGATIVE
Protein, ur: NEGATIVE mg/dL
Specific Gravity, Urine: 1.043 — ABNORMAL HIGH (ref 1.005–1.030)
pH: 5 (ref 5.0–8.0)

## 2023-08-08 MED ORDER — LORAZEPAM 0.5 MG PO TABS
0.5000 mg | ORAL_TABLET | Freq: Once | ORAL | Status: AC
  Administered 2023-08-08: 0.5 mg via ORAL
  Filled 2023-08-08: qty 1

## 2023-08-08 MED ORDER — SODIUM CHLORIDE 0.9 % IV BOLUS: 1000.0000 mL | Freq: Once | INTRAVENOUS | Status: DC

## 2023-08-08 NOTE — Discharge Instructions (Signed)
 You were seen in the emergency department for chronic jaw pain and after discontinuing fluoxetine  Your blood work looked okay You refused an EKG and IV fluids You wanted to be discharged It is important to follow-up with your primary care doctor within 1 week for reevaluation Return to the emergency department for any other concerns

## 2023-08-08 NOTE — ED Notes (Signed)
 Pt is refusing EKG. Pt states its nothing to due with her heart.

## 2023-08-08 NOTE — ED Triage Notes (Signed)
 Pt BIB EMS from home, c/o withdrawal from antidepressant fluoxetine . She was reportedly told by pharmacist to stop taking med 5 days ago. She now reports feeling like her ears are clogged (noted with paper in ear on arrival). Per pt,tremor active when stressed. thinks she has TMJ.  BP 132/62 P 102 RR 18 SpO2 98% CBG 138

## 2023-08-08 NOTE — ED Provider Triage Note (Signed)
 Emergency Medicine Provider Triage Evaluation Note  Lisa Meyers , a 68 y.o. female  was evaluated in triage.  Pt complains of withdrawal and jaw pt Pt stopped medications.  Pt feels shaky  Review of Systems  Positive: Pain in jaws Negative: fever  Physical Exam  BP 111/72   Pulse 93   Temp 97.9 F (36.6 C) (Oral)   Resp 18   SpO2 100%  Gen:   Awake, no distress   Resp:  Normal effort  MSK:   Moves extremities without difficulty  Other:    Medical Decision Making  Medically screening exam initiated at 3:58 PM.  Appropriate orders placed.  Lisa Meyers was informed that the remainder of the evaluation will be completed by another provider, this initial triage assessment does not replace that evaluation, and the importance of remaining in the ED until their evaluation is complete.     Sandi Crosby, PA-C 08/08/23 [phone]

## 2023-08-08 NOTE — ED Notes (Signed)
 Pt is refusing the IVF she states she would rather just drink some water. EDP notified.

## 2023-08-08 NOTE — ED Notes (Signed)
 Pt just wanted to go and declined having her vitals taken at d/c.   Pt provided discharge instructions and prescription information. Pt was given the opportunity to ask questions and questions were answered.

## 2023-08-08 NOTE — ED Notes (Addendum)
 Pt states she is here because her ears are stuff and that she has nasal congestion and can't breath through her nose. She does states that the UC started on fluoxetine  last month and it caused a bad taste and odor so she stopped it. Pt appears very anxious.

## 2023-08-08 NOTE — ED Provider Notes (Signed)
  EMERGENCY DEPARTMENT AT [facility] Provider Note   CSN: [id number] Arrival date & time: 08/08/23  1459     History  Chief Complaint  Patient presents with   Withdrawal    Lisa Meyers is a 68 y.o. female.  With a history of hypertension, diabetes hyperlipidemia and TMJ who presents to the ED given concern for withdrawal.  Patient was started on fluoxetine  by her PCP 5 to 6 months ago.  She did not like the way this medication tasted or made her feel and stopped taking it 3 to 4 days ago.  She does note increased pain related to left TMJ but has been able to open her mouth and drink.  Has a resting tremor associated with anxiety which she has had for many years.  Denies fevers chills chest pain shortness of breath nausea vomiting.  Last bowel movement was yesterday and was normal.  Patient lives alone.  Denies SI HI.  HPI     Home Medications Prior to Admission medications   Medication Sig Start Date End Date Taking? Authorizing Provider  Ascorbic Acid (VITAMIN C) 100 MG tablet Take 100 mg by mouth daily.    [provider]  blood glucose meter kit and supplies Dispense based on patient and insurance preference. Check blood sugar once daily. 06/06/19   Paz, Jose E, MD  celecoxib (CELEBREX) 200 MG capsule Take 200 mg by mouth daily. 11/20/22   [provider]  FARXIGA 5 MG TABS tablet Take 5 mg by mouth daily.    [provider]  FLUoxetine  (PROZAC ) 10 MG tablet Take 1 tablet (10 mg total) by mouth daily. 06/14/23   Afton Albright, MD  glucose blood (ONETOUCH ULTRA) test strip Check blood sugars once daily 08/21/19   Paz, Jose E, MD  hydrochlorothiazide  (HYDRODIURIL ) 12.5 MG tablet Take 1 tablet by mouth once daily 11/16/21   Paz, Jose E, MD  lisinopril  (ZESTRIL ) 20 MG tablet Take 1 tablet (20 mg total) by mouth daily. 12/10/21   Paz, Jose E, MD  loratadine (CLARITIN) 10 MG tablet Take by mouth. 07/15/22   [provider]  lovastatin   (MEVACOR ) 20 MG tablet Take 1 tablet (20 mg total) by mouth at bedtime. 02/23/21   Copland, Skipper Dumas, MD  metFORMIN  (GLUCOPHAGE ) 1000 MG tablet Take 1 tablet (1,000 mg total) by mouth daily with breakfast. 08/13/21   Paz, Jose E, MD  Multiple Vitamins-Minerals (DAILY MULTIVITAMIN PO) Take by mouth.    [provider]  pioglitazone (ACTOS) 45 MG tablet Take by mouth. 05/21/22   [provider]  rosuvastatin (CRESTOR) 20 MG tablet Take 20 mg by mouth daily.    [provider]  VITAMIN D , ERGOCALCIFEROL , PO Take by mouth.    [provider]      Allergies    Patient has no known allergies.    Review of Systems   Review of Systems  Physical Exam Updated Vital Signs BP 117/82 (BP Location: Left Arm)   Pulse 93   Temp 98.4 F (36.9 C) (Oral)   Resp 18   SpO2 98%  Physical Exam Vitals and nursing note reviewed.  HENT:     Head: Normocephalic and atraumatic.  Eyes:     Pupils: Pupils are equal, round, and reactive to light.  Cardiovascular:     Rate and Rhythm: Normal rate and regular rhythm.  Pulmonary:     Effort: Pulmonary effort is normal.     Breath sounds: Normal  breath sounds.  Abdominal:     Palpations: Abdomen is soft.     Tenderness: There is no abdominal tenderness.  Skin:    General: Skin is warm and dry.  Neurological:     Mental Status: She is alert.     Comments: Resting tremor bilateral hands and legs which patient is able to stop on her own volition Awake alert oriented x 4 No focal neurologic deficit with symmetric sensation and motor strength  Psychiatric:        Mood and Affect: Mood normal.     ED Results / Procedures / Treatments   Labs (all labs ordered are listed, but only abnormal results are displayed) Labs Reviewed  COMPREHENSIVE METABOLIC PANEL WITH GFR - Abnormal; Notable for the following components:      Result Value   Glucose, Bld 129 (*)    BUN 28 (*)    All other components within normal limits   URINALYSIS, ROUTINE W REFLEX MICROSCOPIC - Abnormal; Notable for the following components:   Specific Gravity, Urine 1.043 (*)    Glucose, UA >=500 (*)    Ketones, ur 20 (*)    All other components within normal limits  CBC WITH DIFFERENTIAL/PLATELET    EKG None  Radiology No results found.  Procedures Procedures    Medications Ordered in ED Medications  sodium chloride 0.9 % bolus 1,000 mL (1,000 mLs Intravenous Patient Refused/Not Given 08/08/23 1855)  LORazepam (ATIVAN) tablet 0.5 mg (0.5 mg Oral Given 08/08/23 1853)    ED Course/ Medical Decision Making/ A&P Clinical Course as of 08/08/23 1941  Mon Aug 08, 2023  1940 Laboratory workup notable for mild hyperglycemia and slight elevation in BUN.  No other concerning findings.  Patient feels well enough is requesting discharge home.  She has refused EKG and IV fluids.  Stable for discharge [MP]    Clinical Course User Index [MP] Lisa Creamer, DO                                 Medical Decision Making 68 year old female with history as above presenting for concern of potential medication withdrawal after stopping fluoxetine  3 days ago.  Acute on chronic TMJ pain.  Has been able to eat and drink.  Is anxious with volitional tremor on my exam but otherwise well-appearing.  Afebrile normotensive.  Will obtain basic laboratory workup to look for electrolyte imbalance, anemia, infection along with EKG to look for dysrhythmia.  Will trial small dose of p.o. Ativan 0.5 mg to help with her anxiety here and reassess  Amount and/or Complexity of Data Reviewed Labs: ordered.  Risk Prescription drug management.           Final Clinical Impression(s) / ED Diagnoses Final diagnoses:  Anxiety    Rx / DC Orders ED Discharge Orders     None         Lisa Creamer, DO 08/08/23 1941

## 2023-08-10 DIAGNOSIS — M26609 Unspecified temporomandibular joint disorder, unspecified side: Secondary | ICD-10-CM | POA: Diagnosis not present

## 2023-08-10 DIAGNOSIS — F411 Generalized anxiety disorder: Secondary | ICD-10-CM | POA: Diagnosis not present

## 2023-08-10 DIAGNOSIS — J0101 Acute recurrent maxillary sinusitis: Secondary | ICD-10-CM | POA: Diagnosis not present

## 2023-08-10 DIAGNOSIS — H938X3 Other specified disorders of ear, bilateral: Secondary | ICD-10-CM | POA: Diagnosis not present

## 2023-08-25 DIAGNOSIS — I10 Essential (primary) hypertension: Secondary | ICD-10-CM | POA: Diagnosis not present

## 2023-08-25 DIAGNOSIS — R251 Tremor, unspecified: Secondary | ICD-10-CM | POA: Diagnosis not present

## 2023-08-25 DIAGNOSIS — E782 Mixed hyperlipidemia: Secondary | ICD-10-CM | POA: Diagnosis not present

## 2023-08-25 DIAGNOSIS — E119 Type 2 diabetes mellitus without complications: Secondary | ICD-10-CM | POA: Diagnosis not present

## 2023-08-25 DIAGNOSIS — F321 Major depressive disorder, single episode, moderate: Secondary | ICD-10-CM | POA: Diagnosis not present

## 2023-08-25 DIAGNOSIS — R531 Weakness: Secondary | ICD-10-CM | POA: Diagnosis not present

## 2023-08-25 DIAGNOSIS — R634 Abnormal weight loss: Secondary | ICD-10-CM | POA: Diagnosis not present

## 2023-10-28 ENCOUNTER — Encounter (HOSPITAL_COMMUNITY): Payer: Self-pay

## 2023-10-28 ENCOUNTER — Ambulatory Visit (HOSPITAL_COMMUNITY): Admission: EM | Admit: 2023-10-28 | Discharge: 2023-10-28 | Disposition: A | Discharge status: Home / Self Care

## 2023-10-28 DIAGNOSIS — M545 Low back pain, unspecified: Secondary | ICD-10-CM

## 2023-10-28 DIAGNOSIS — W19XXXA Unspecified fall, initial encounter: Secondary | ICD-10-CM

## 2023-10-28 DIAGNOSIS — R4020 Unspecified coma: Secondary | ICD-10-CM

## 2023-10-28 DIAGNOSIS — R531 Weakness: Secondary | ICD-10-CM | POA: Diagnosis not present

## 2023-10-28 NOTE — Discharge Instructions (Addendum)
 My recommendation is that you be seen in the emergency department for further evaluation due to an episode of loss of consciousness and being down on the ground for an unknown amount of time. You have signed an AMA (AGAINST MEDICAL ADVICE) form while you were here in clinic which means that you understand my recommendation and the risks for not receiving further evaluation related to your complaints today.

## 2023-10-28 NOTE — ED Provider Notes (Addendum)
 [facility]    CSN: [id number] Arrival date & time: 10/28/23  0802      History   Chief Complaint Chief Complaint  Patient presents with   Fall   Back Pain    HPI Lisa Meyers is a 68 y.o. female.   Patient presents with right low back pain and hip pain after fall that occurred yesterday.  Patient states that she fell yesterday and lost consciousness upon falling.  Patient states that she was  on the ground for an unknown amount of time.  Patient states that she was having difficulty getting up due to weakness after her fall and therefore was on the ground for a few hours.  Patient states that she is unsure if she hit her head.  Patient states that she now just has low back and hip pain after this fall and does not believe that she hit her head because of this.  Patient states that prior to her fall she became very dizzy shortly after taking her metformin .  Patient states that she has told her doctor in the past that her taking metformin  makes her dizzy and that she does not want to take this anymore because of this.  Patient states that she has chronic tremors which she states could be related to Parkinson's.  Patient states that she has not received a definitive diagnosis of this, but has been told in the past that this could be related.  Patient denies having any family members that live nearby and states that a kind stranger drove her up here to urgent care to be evaluated.  The history is provided by the patient and medical records.  Fall  Back Pain   Past Medical History:  Diagnosis Date   Diabetes mellitus without complication (HCC)    Hyperlipidemia 02/11/2014   Hypertension    Neck pain    PPD positive dx in the 90s    Patient Active Problem List   Diagnosis Date Noted   Ear fullness, bilateral 11/04/2021   Temporomandibular joint dysfunction 11/04/2021   Abnormal auditory perception of both ears 11/04/2021   Annual physical exam 11/26/2016   PCP  NOTES >>>>>>>>>>>>> 05/31/2016   Hypertension 02/11/2014   Diabetes mellitus without complication (HCC) 02/11/2014   Hyperlipidemia 02/11/2014   PPD positive 02/11/2014    Past Surgical History:  Procedure Laterality Date   ABDOMINAL HYSTERECTOMY     abdominal, no oophorectomy   BREAST CYST EXCISION     BREAST EXCISIONAL BIOPSY Right    CYST REMOVAL LEG  07-2013   inner L thigh, Dr Megan, [place]     OB History     Gravida  1   Para  1   Term      Preterm      AB      Living  1      SAB      IAB      Ectopic      Multiple      Live Births  1            Home Medications    Prior to Admission medications   Medication Sig Start Date End Date Taking? Authorizing Provider  Ascorbic Acid (VITAMIN C) 100 MG tablet Take 100 mg by mouth daily.    [provider]  blood glucose meter kit and supplies Dispense based on patient and insurance preference. Check blood sugar once daily. 06/06/19   Paz, Jose E, MD  celecoxib (  CELEBREX) 200 MG capsule Take 200 mg by mouth daily. 11/20/22   [provider]  FARXIGA 5 MG TABS tablet Take 5 mg by mouth daily.    [provider]  FLUoxetine  (PROZAC ) 10 MG tablet Take 1 tablet (10 mg total) by mouth daily. 06/14/23   Rolinda Rogue, MD  glucose blood (ONETOUCH ULTRA) test strip Check blood sugars once daily 08/21/19   Paz, Jose E, MD  hydrochlorothiazide  (HYDRODIURIL ) 12.5 MG tablet Take 1 tablet by mouth once daily 11/16/21   Paz, Jose E, MD  lisinopril  (ZESTRIL ) 20 MG tablet Take 1 tablet (20 mg total) by mouth daily. 12/10/21   Paz, Jose E, MD  loratadine (CLARITIN) 10 MG tablet Take by mouth. 07/15/22   [provider]  lovastatin  (MEVACOR ) 20 MG tablet Take 1 tablet (20 mg total) by mouth at bedtime. 02/23/21   Copland, Harlene BROCKS, MD  metFORMIN  (GLUCOPHAGE ) 1000 MG tablet Take 1 tablet (1,000 mg total) by mouth daily with breakfast. 08/13/21   Paz, Jose E, MD  Multiple Vitamins-Minerals (DAILY  MULTIVITAMIN PO) Take by mouth.    [provider]  pioglitazone (ACTOS) 45 MG tablet Take by mouth. 05/21/22   [provider]  rosuvastatin (CRESTOR) 20 MG tablet Take 20 mg by mouth daily.    [provider]  VITAMIN D , ERGOCALCIFEROL , PO Take by mouth.    [provider]    Family History Family History  Problem Relation Age of Onset   Hypertension Mother    Hypertension Father    Heart disease Father    Diabetes Maternal Aunt    Colon cancer Neg Hx    Breast cancer Neg Hx    CAD Neg Hx     Social History Social History   Tobacco Use   Smoking status: Never   Smokeless tobacco: Never  Vaping Use   Vaping status: Never Used  Substance Use Topics   Alcohol use: Not Currently   Drug use: No     Allergies   Claritin [loratadine] and Prednisone   Review of Systems Review of Systems  Musculoskeletal:  Positive for back pain.   Per HPI  Physical Exam Triage Vital Signs ED Triage Vitals  Encounter Vitals Group     BP 10/28/23 0824 130/88     Girls Systolic BP Percentile --      Girls Diastolic BP Percentile --      Boys Systolic BP Percentile --      Boys Diastolic BP Percentile --      Pulse Rate 10/28/23 0824 (!) 104     Resp 10/28/23 0824 16     Temp 10/28/23 0824 99 F (37.2 C)     Temp Source 10/28/23 0824 Oral     SpO2 10/28/23 0824 95 %     Weight --      Height --      Head Circumference --      Peak Flow --      Pain Score 10/28/23 0826 8     Pain Loc --      Pain Education --      Exclude from Growth Chart --    No data found.  Updated Vital Signs BP 130/88 (BP Location: Right Arm)   Pulse (!) 104   Temp 99 F (37.2 C) (Oral)   Resp 16   SpO2 95%   Visual Acuity Right Eye Distance:   Left Eye Distance:   Bilateral Distance:    Right  Eye Near:   Left Eye Near:    Bilateral Near:     Physical Exam Vitals and nursing note reviewed.  Constitutional:      General: She is awake. She is not in  acute distress.    Appearance: Normal appearance. She is well-developed and well-groomed. She is not ill-appearing.  Eyes:     Extraocular Movements: Extraocular movements intact.     Conjunctiva/sclera: Conjunctivae normal.     Pupils: Pupils are equal, round, and reactive to light.  Cardiovascular:     Rate and Rhythm: Normal rate and regular rhythm.  Pulmonary:     Effort: Pulmonary effort is normal.     Breath sounds: Normal breath sounds.  Musculoskeletal:        General: Normal range of motion.     Cervical back: Normal, normal range of motion and neck supple.     Thoracic back: Normal.     Lumbar back: Tenderness present. No swelling, edema, deformity, spasms or bony tenderness. Normal range of motion. Negative right straight leg raise test and negative left straight leg raise test.       Back:  Skin:    General: Skin is warm and dry.  Neurological:     General: No focal deficit present.     Mental Status: She is alert and oriented to person, place, and time. Mental status is at baseline.     GCS: GCS eye subscore is 4. GCS verbal subscore is 5. GCS motor subscore is 6.     Cranial Nerves: Cranial nerves 2-12 are intact.     Sensory: Sensation is intact.     Motor: Tremor present.     Coordination: Coordination is intact.     Gait: Gait is intact.  Psychiatric:        Attention and Perception: Attention and perception normal.        Mood and Affect: Mood and affect normal.        Speech: Speech normal.        Behavior: Behavior normal. Behavior is cooperative.        Thought Content: Thought content normal.        Cognition and Memory: Cognition and memory normal.        Judgment: Judgment normal.      UC Treatments / Results  Labs (all labs ordered are listed, but only abnormal results are displayed) Labs Reviewed - No data to display  EKG   Radiology No results found.  Procedures Procedures (including critical care time)  Medications Ordered in  UC Medications - No data to display  Initial Impression / Assessment and Plan / UC Course  I have reviewed the triage vital signs and the nursing notes.  Pertinent labs & imaging results that were available during my care of the patient were reviewed by me and considered in my medical decision making (see chart for details).     Patient is overall well-appearing.  Vitals are stable.  Mild tachycardia present at 104.  Tremor noted on exam.  Without any other neurological deficits.  GCS 15.  A&O x 3.  EOMI and PERRLA.  Recommended patient be seen in the emergency department for further evaluation due to episode of loss of consciousness and unknown amount of time laying on the ground.  Patient adamantly refusing to go to the emergency department for further evaluation.  Suggested that we at least do an EKG and some blood work here to evaluate for underlying causes related to  her episode yesterday.  Patient refuses any additional workup and stands up to leave the room.    Discussed with patient the importance of being evaluated further due to episode that occurred yesterday.  Discussed risk and benefits of this. Patient continued to adamantly refuse going to the emergency department.  Patient is alert and oriented and able to make decisions for herself.  Patient signed AMA form. Final Clinical Impressions(s) / UC Diagnoses   Final diagnoses:  Loss of consciousness (HCC)  Fall, initial encounter  Acute left-sided low back pain without sciatica  Weakness     Discharge Instructions      My recommendation is that you be seen in the emergency department for further evaluation due to an episode of loss of consciousness and being down on the ground for an unknown amount of time. You have signed an AMA (AGAINST MEDICAL ADVICE) form while you were here in clinic which means that you understand my recommendation and the risks for not receiving further evaluation related to your complaints  today.   ED Prescriptions   None    PDMP not reviewed this encounter.   Johnie Rumaldo LABOR, NP 10/28/23 [id number]    Johnie Rumaldo LABOR, NP 10/28/23 [phone]

## 2023-10-28 NOTE — ED Triage Notes (Signed)
 Patient states she fell yesterday and states she laid on the floor all night. Patient c/o right lower back pain and right hip pain. Patient states she was laying on the right side all night while on the floor. Patient states she has a history of arthritis. Patient states she is very nervous and states she has so many medicines that make her like this. Patient states, I shake shake shake all day.  Patient is hard to understand.

## 2023-11-19 ENCOUNTER — Other Ambulatory Visit: Payer: Self-pay

## 2023-11-19 ENCOUNTER — Emergency Department (HOSPITAL_COMMUNITY)
Admission: EM | Admit: 2023-11-19 | Discharge: 2023-11-19 | Discharge status: Against Medical Advice | Attending: Emergency Medicine | Admitting: Emergency Medicine

## 2023-11-19 DIAGNOSIS — Z5321 Procedure and treatment not carried out due to patient leaving prior to being seen by health care provider: Secondary | ICD-10-CM | POA: Diagnosis not present

## 2023-11-19 DIAGNOSIS — M79606 Pain in leg, unspecified: Secondary | ICD-10-CM | POA: Diagnosis present

## 2023-11-19 NOTE — ED Triage Notes (Signed)
 Pt BIB [facility] from home with CC of bilateral lower leg pain and heaviness. Pt reports that she has severe TMJ and was prescribed predisone for that pain. She has been taking prednisone for three days and states it is making her legs heavy and hurt. She states she can still ambulate. Aox4. Endorses TMJ jaw pain 7/10 also.

## 2023-11-19 NOTE — ED Notes (Signed)
 Pt states that she would like to leave AMA. Pt states that she called a cab for herself and feels confident to go home.

## 2023-11-19 NOTE — ED Provider Notes (Signed)
 Saw the patient, upon initially evaluated the patient, noticed approximately 6 bedbugs crawling on her.  Told her that we will have to pause her exam and have her transferred into a room lest risk contamination of the whole hospital.  Patient was agreeable to this, noted that she was initially be transferred to [facility].  However in the interim, patient wished to be discharged AMA.  Was not able to reevaluate her before if she left AMA.   Lisa Meyers [name] 11/19/23 2021    Randol Simmonds, MD 11/21/23 [phone]

## 2023-11-20 ENCOUNTER — Encounter (HOSPITAL_COMMUNITY): Payer: Self-pay

## 2023-11-20 ENCOUNTER — Other Ambulatory Visit: Payer: Self-pay

## 2023-11-20 ENCOUNTER — Emergency Department (HOSPITAL_COMMUNITY)
Admission: EM | Admit: 2023-11-20 | Discharge: 2023-11-20 | Disposition: A | Discharge status: Home / Self Care | Attending: Emergency Medicine | Admitting: Emergency Medicine

## 2023-11-20 DIAGNOSIS — R6884 Jaw pain: Secondary | ICD-10-CM | POA: Diagnosis present

## 2023-11-20 DIAGNOSIS — Z7984 Long term (current) use of oral hypoglycemic drugs: Secondary | ICD-10-CM | POA: Diagnosis not present

## 2023-11-20 DIAGNOSIS — Z79899 Other long term (current) drug therapy: Secondary | ICD-10-CM | POA: Insufficient documentation

## 2023-11-20 DIAGNOSIS — I10 Essential (primary) hypertension: Secondary | ICD-10-CM | POA: Insufficient documentation

## 2023-11-20 DIAGNOSIS — M26622 Arthralgia of left temporomandibular joint: Secondary | ICD-10-CM | POA: Diagnosis not present

## 2023-11-20 DIAGNOSIS — E119 Type 2 diabetes mellitus without complications: Secondary | ICD-10-CM | POA: Insufficient documentation

## 2023-11-20 LAB — COMPREHENSIVE METABOLIC PANEL WITH GFR
ALT: 14 U/L (ref 0–44)
AST: 16 U/L (ref 15–41)
Albumin: 3.7 g/dL (ref 3.5–5.0)
Alkaline Phosphatase: 49 U/L (ref 38–126)
Anion gap: 12 (ref 5–15)
BUN: 19 mg/dL (ref 8–23)
CO2: 27 mmol/L (ref 22–32)
Calcium: 10.5 mg/dL — ABNORMAL HIGH (ref 8.9–10.3)
Chloride: 99 mmol/L (ref 98–111)
Creatinine, Ser: 0.85 mg/dL (ref 0.44–1.00)
GFR, Estimated: >60 mL/min (ref >60)
Glucose, Bld: 175 mg/dL — ABNORMAL HIGH (ref 70–99)
Potassium: 3.6 mmol/L (ref 3.5–5.1)
Sodium: 138 mmol/L (ref 135–145)
Total Bilirubin: 0.4 mg/dL (ref 0.0–1.2)
Total Protein: 7 g/dL (ref 6.5–8.1)

## 2023-11-20 LAB — CBC
HCT: 41.6 % (ref 36.0–46.0)
Hemoglobin: 13.9 g/dL (ref 12.0–15.0)
MCH: 31 pg (ref 26.0–34.0)
MCHC: 33.4 g/dL (ref 30.0–36.0)
MCV: 92.7 fL (ref 80.0–100.0)
Platelets: 256 K/uL (ref 150–400)
RBC: 4.49 MIL/uL (ref 3.87–5.11)
RDW: 12.7 % (ref 11.5–15.5)
WBC: 3.4 K/uL — ABNORMAL LOW (ref 4.0–10.5)
nRBC: 0 % (ref 0.0–0.2)

## 2023-11-20 LAB — TROPONIN I (HIGH SENSITIVITY): Troponin I (High Sensitivity): 3 ng/L (ref <18)

## 2023-11-20 MED ORDER — ACETAMINOPHEN 500 MG PO TABS: 500.0000 mg | ORAL_TABLET | Freq: Four times a day (QID) | ORAL | 0 refills | Status: AC | PRN

## 2023-11-20 MED ORDER — ROSUVASTATIN CALCIUM 20 MG PO TABS: 20.0000 mg | ORAL_TABLET | Freq: Every day | ORAL | 0 refills | Status: AC

## 2023-11-20 MED ORDER — GABAPENTIN 100 MG PO CAPS: 100.0000 mg | ORAL_CAPSULE | Freq: Three times a day (TID) | ORAL | 0 refills | Status: AC

## 2023-11-20 NOTE — Discharge Instructions (Signed)
 It is important for you to follow-up with any ENT specialist for further evaluation and managements of your persistent left jaw pain that may be related to TMJ.  You may take medication prescribed as needed for pain and follow-up with your doctor for further care.  Return if you have any concern.

## 2023-11-20 NOTE — ED Notes (Signed)
 At discharge, Pt began c/o bilateral leg pain but got irritated when this writer asked assessment questions.  EDP made aware and directed Pt to follow-up with PCP.  Pt educated that gabapentin  could help with pain.    Pt given a bus pass.

## 2023-11-20 NOTE — ED Notes (Signed)
 ED Provider at bedside.

## 2023-11-20 NOTE — ED Provider Notes (Addendum)
 [place] [facility]PARTMENT AT [facility] Provider Note   CSN: [id number] Arrival date & time: 11/20/23  9061     Patient presents with: Jaw Pain   Lisa Meyers is a 68 y.o. female.   The history is provided by the patient and medical records. No language interpreter was used.     68 year old female history of TMJ, hypertension, diabetes, hyperlipidemia, presenting with complaints of jaw pain.  Patient mention she was diagnosed with TMJ 3 years ago.  She mention recurrent pain primarily to her left jaw and ear for several years.  Pain is worse with talking, eating, chewing and also endorsed dental pain. she voiced frustration with her pain and states she has been seen and evaluated by her primary care doctor as well as by specialist and they did not do anything for me.  She mention she was seen by his PCP earlier this week for her complaint and was prescribed prednisone.  States he does not do anything except making her legs feels heavy.  She is requesting for medication refill and medication to help her with her symptoms.  She denies hearing loss denies any fever neck pain trouble swallowing or chest pain.  No recent injury.  Prior to Admission medications   Medication Sig Start Date End Date Taking? Authorizing Provider  Ascorbic Acid (VITAMIN C) 100 MG tablet Take 100 mg by mouth daily.    [provider]  blood glucose meter kit and supplies Dispense based on patient and insurance preference. Check blood sugar once daily. 06/06/19   Paz, Jose E, MD  celecoxib (CELEBREX) 200 MG capsule Take 200 mg by mouth daily. 11/20/22   [provider]  FARXIGA 5 MG TABS tablet Take 5 mg by mouth daily.    [provider]  FLUoxetine  (PROZAC ) 10 MG tablet Take 1 tablet (10 mg total) by mouth daily. 06/14/23   Rolinda Rogue, MD  glucose blood (ONETOUCH ULTRA) test strip Check blood sugars once daily 08/21/19   Paz, Jose E, MD  hydrochlorothiazide  (HYDRODIURIL )  12.5 MG tablet Take 1 tablet by mouth once daily 11/16/21   Paz, Jose E, MD  lisinopril  (ZESTRIL ) 20 MG tablet Take 1 tablet (20 mg total) by mouth daily. 12/10/21   Paz, Jose E, MD  loratadine (CLARITIN) 10 MG tablet Take by mouth. 07/15/22   [provider]  lovastatin  (MEVACOR ) 20 MG tablet Take 1 tablet (20 mg total) by mouth at bedtime. 02/23/21   Copland, Harlene BROCKS, MD  metFORMIN  (GLUCOPHAGE ) 1000 MG tablet Take 1 tablet (1,000 mg total) by mouth daily with breakfast. 08/13/21   Paz, Jose E, MD  Multiple Vitamins-Minerals (DAILY MULTIVITAMIN PO) Take by mouth.    [provider]  pioglitazone (ACTOS) 45 MG tablet Take by mouth. 05/21/22   [provider]  rosuvastatin  (CRESTOR ) 20 MG tablet Take 20 mg by mouth daily.    [provider]  VITAMIN D , ERGOCALCIFEROL , PO Take by mouth.    [provider]    Allergies: Claritin [loratadine] and Prednisone    Review of Systems  All other systems reviewed and are negative.   Updated Vital Signs BP 113/87 (BP Location: Right Arm)   Pulse 97   Temp 98.5 F (36.9 C)   Resp (!) 23   SpO2 100%   Physical Exam Vitals and nursing note reviewed.  Constitutional:      General: She is not in acute distress.    Appearance: She is well-developed.  HENT:     Head: Atraumatic.     Ears:     Comments: Left ear: Normal TM, no effusion no erythema normal ear canal and hearing is intact.    Mouth/Throat:     Comments: No malocclusion.  Patient has complete dentures Eyes:     Conjunctiva/sclera: Conjunctivae normal.  Cardiovascular:     Rate and Rhythm: Normal rate and regular rhythm.     Pulses: Normal pulses.     Heart sounds: Normal heart sounds.  Pulmonary:     Effort: Pulmonary effort is normal.  Abdominal:     Palpations: Abdomen is soft.  Musculoskeletal:     Cervical back: Normal range of motion and neck supple. No rigidity.  Skin:    Findings: No rash.  Neurological:     Mental Status: She is  alert.  Psychiatric:        Mood and Affect: Mood normal.     (all labs ordered are listed, but only abnormal results are displayed) Labs Reviewed  CBC - Abnormal; Notable for the following components:      Result Value   WBC 3.4 (*)    All other components within normal limits  COMPREHENSIVE METABOLIC PANEL WITH GFR  TROPONIN I (HIGH SENSITIVITY)    EKG: None  Radiology: No results found.   Procedures   Medications Ordered in the ED - No data to display                                  Medical Decision Making Amount and/or Complexity of Data Reviewed Labs: ordered.  Risk OTC drugs. Prescription drug management.   BP 113/87 (BP Location: Right Arm)   Pulse 97   Temp 98.5 F (36.9 C)   Resp (!) 23   SpO2 100%   81:74 AM 68 year old female history of TMJ, hypertension, diabetes, hyperlipidemia, presenting with complaints of jaw pain.  Patient mention she was diagnosed with TMJ 3 years ago.  She mention recurrent pain primarily to her left jaw and ear for several years.  She voiced frustration with her pain and states she has been seen and evaluated by her primary care doctor as well as by specialist and they did not do anything for me.  She mention she was seen by his PCP earlier this week for her complaint and was prescribed prednisone.  States he does not do anything except making her legs feels heavy.  She is requesting for medication refill and medication to help her with her symptoms.  She denies hearing loss denies any fever neck pain trouble swallowing or chest pain.  No recent injury.  On exam patient is laying in bed in no acute discomfort.  ENT exam unremarkable.  Dental exam unremarkable.  Hearing is intact.  -Labs ordered, independently viewed and interpreted by me.  Labs remarkable for CBG elevated at 175 without evidence of DKA -The patient was maintained on a cardiac monitor.  I personally viewed and interpreted the cardiac monitored which showed an  underlying rhythm of: Normal sinus rhythm -Imaging including head CT scan consider but patient symptoms chronic in nature and without any recent injury or infectious symptoms, CT was not performed -This patient presents to the ED for concern of jaw pain, this involves an extensive number of treatment options, and is a complaint that carries with it a high risk of complications and morbidity.  The differential diagnosis includes TMJ, otitis  media, otitis externa, mastoiditis, -Co morbidities that complicate the patient evaluation includes TMJ, hypertension, diabetes, hyperlipidemia -Treatment includes reassurance -Reevaluation of the patient after these medicines showed that the patient stayed the same -PCP office notes or outside notes reviewed -Escalation to admission/observation considered: patient  is comfortable with discharge, and will follow up with ENT -Prescription medication considered, patient comfortable with flexeril and gabapentin  -Social Determinant of Health considered       Final diagnoses:  Arthralgia of left temporomandibular joint    ED Discharge Orders          Ordered    rosuvastatin  (CRESTOR ) 20 MG tablet  Daily        11/20/23 1114    gabapentin  (NEURONTIN ) 100 MG capsule  3 times daily        11/20/23 1114    acetaminophen  (TYLENOL ) 500 MG tablet  Every 6 hours PRN        11/20/23 1114               Nivia Colon, PA-C 11/20/23 1115    Nivia Colon, PA-C 11/20/23 1116    Nivia Colon, PA-C 11/20/23 1116    Pamella Ozell LABOR, DO 11/28/23 [phone]

## 2023-11-20 NOTE — ED Notes (Signed)
 Pt verbalized understanding of discharge instructions. Opportunity for questions provided.   Pt frustrated at discharge and did not want to leave.  Pt educated she needs to follow-up with PCP regarding chronic conditions.    Pt had been given a bus pass, but informed this Clinical research associate she would be calling a cab.  Sort NT made aware of Pt.

## 2023-11-20 NOTE — ED Triage Notes (Addendum)
 Pt c.o TMJ pain, pt was seen here yesterday for the same but left AMA/. States she was started on prednisone but it is making her legs feel very heavy. Pt seems very anxious in triage

## 2023-11-21 ENCOUNTER — Encounter

## 2023-12-07 ENCOUNTER — Ambulatory Visit (HOSPITAL_COMMUNITY): Admission: EM | Admit: 2023-12-07 | Discharge: 2023-12-07 | Discharge status: Against Medical Advice

## 2023-12-07 NOTE — ED Notes (Signed)
 Pt was called for a room and states she is leaving.

## 2023-12-14 ENCOUNTER — Encounter: Admitting: Student

## 2024-01-16 ENCOUNTER — Encounter (HOSPITAL_COMMUNITY): Payer: Self-pay

## 2024-01-16 ENCOUNTER — Ambulatory Visit (HOSPITAL_COMMUNITY)
Admission: EM | Admit: 2024-01-16 | Discharge: 2024-01-16 | Disposition: A | Discharge status: Home / Self Care | Attending: Emergency Medicine | Admitting: Emergency Medicine

## 2024-01-16 DIAGNOSIS — M26622 Arthralgia of left temporomandibular joint: Secondary | ICD-10-CM | POA: Diagnosis not present

## 2024-01-16 DIAGNOSIS — Z5189 Encounter for other specified aftercare: Secondary | ICD-10-CM

## 2024-01-16 MED ORDER — MUPIROCIN 2 % EX OINT: 1.0000 | TOPICAL_OINTMENT | Freq: Two times a day (BID) | CUTANEOUS | 0 refills | Status: AC

## 2024-01-16 MED ORDER — CELECOXIB 200 MG PO CAPS: 200.0000 mg | ORAL_CAPSULE | Freq: Every day | ORAL | 0 refills | Status: AC

## 2024-01-16 NOTE — ED Provider Notes (Signed)
 [facility]    CSN: [id number] Arrival date & time: 01/16/24  1121      History   Chief Complaint Chief Complaint  Patient presents with   Wound Check   Temporomandibular Joint Pain    HPI Lisa Meyers is a 68 y.o. female.   Patient presents for wound check.  Patient states that she has a wound on her bottom that she would like to be looked at today.  Patient states that she was previously prescribed antibiotics for this months ago and was told that this would heal on its own, but states that she still has a wound and is concerned that it may be infected again.  Denies swelling, drainage, or fever.  Patient also has history of TMJ and is requesting refill of Celebrex to help with this.  Patient states that this is the only thing that seems to help with her TMJ.  The history is provided by the patient and medical records.  Wound Check    Past Medical History:  Diagnosis Date   Diabetes mellitus without complication (HCC)    Hyperlipidemia 02/11/2014   Hypertension    Neck pain    PPD positive dx in the 90s    Patient Active Problem List   Diagnosis Date Noted   Ear fullness, bilateral 11/04/2021   Temporomandibular joint dysfunction 11/04/2021   Abnormal auditory perception of both ears 11/04/2021   Annual physical exam 11/26/2016   PCP NOTES >>>>>>>>>>>>> 05/31/2016   Hypertension 02/11/2014   Diabetes mellitus without complication (HCC) 02/11/2014   Hyperlipidemia 02/11/2014   PPD positive 02/11/2014    Past Surgical History:  Procedure Laterality Date   ABDOMINAL HYSTERECTOMY     abdominal, no oophorectomy   BREAST CYST EXCISION     BREAST EXCISIONAL BIOPSY Right    CYST REMOVAL LEG  07-2013   inner L thigh, Dr Megan, Roselie     OB History     Gravida  1   Para  1   Term      Preterm      AB      Living  1      SAB      IAB      Ectopic      Multiple      Live Births  1            Home Medications    Prior  to Admission medications   Medication Sig Start Date End Date Taking? Authorizing Provider  celecoxib (CELEBREX) 200 MG capsule Take 1 capsule (200 mg total) by mouth daily. 01/16/24  Yes Johnie, Smera Guyette A, NP  mupirocin  ointment (BACTROBAN ) 2 % Apply 1 Application topically 2 (two) times daily. 01/16/24  Yes Johnie, Marquesa Rath A, NP  acetaminophen  (TYLENOL ) 500 MG tablet Take 1 tablet (500 mg total) by mouth every 6 (six) hours as needed. 11/20/23   Nivia Colon, PA-C  Ascorbic Acid (VITAMIN C) 100 MG tablet Take 100 mg by mouth daily.    [provider]  blood glucose meter kit and supplies Dispense based on patient and insurance preference. Check blood sugar once daily. 06/06/19   Paz, Jose E, MD  FARXIGA 5 MG TABS tablet Take 5 mg by mouth daily.    [provider]  FLUoxetine  (PROZAC ) 10 MG tablet Take 1 tablet (10 mg total) by mouth daily. 06/14/23   Rolinda Rogue, MD  gabapentin  (NEURONTIN ) 100 MG capsule Take 1 capsule (100 mg total) by mouth  3 (three) times daily. 11/20/23   Nivia Colon, PA-C  glucose blood [facility] ULTRA) test strip Check blood sugars once daily 08/21/19   Paz, Jose E, MD  hydrochlorothiazide  (HYDRODIURIL ) 12.5 MG tablet Take 1 tablet by mouth once daily 11/16/21   Paz, Jose E, MD  lisinopril  (ZESTRIL ) 20 MG tablet Take 1 tablet (20 mg total) by mouth daily. 12/10/21   Paz, Jose E, MD  loratadine (CLARITIN) 10 MG tablet Take by mouth. 07/15/22   [provider]  lovastatin  (MEVACOR ) 20 MG tablet Take 1 tablet (20 mg total) by mouth at bedtime. 02/23/21   Copland, Harlene BROCKS, MD  metFORMIN  (GLUCOPHAGE ) 1000 MG tablet Take 1 tablet (1,000 mg total) by mouth daily with breakfast. 08/13/21   Paz, Jose E, MD  Multiple Vitamins-Minerals (DAILY MULTIVITAMIN PO) Take by mouth.    [provider]  pioglitazone (ACTOS) 45 MG tablet Take by mouth. 05/21/22   [provider]  rosuvastatin  (CRESTOR ) 20 MG tablet Take 1 tablet (20 mg total) by mouth daily.  11/20/23   Nivia Colon, PA-C  VITAMIN D , ERGOCALCIFEROL , PO Take by mouth.    [provider]    Family History Family History  Problem Relation Age of Onset   Hypertension Mother    Hypertension Father    Heart disease Father    Diabetes Maternal Aunt    Colon cancer Neg Hx    Breast cancer Neg Hx    CAD Neg Hx     Social History Social History   Tobacco Use   Smoking status: Never   Smokeless tobacco: Never  Vaping Use   Vaping status: Never Used  Substance Use Topics   Alcohol use: Not Currently   Drug use: No     Allergies   Claritin [loratadine] and Prednisone   Review of Systems Review of Systems  Per HPI  Physical Exam Triage Vital Signs ED Triage Vitals  Encounter Vitals Group     BP 01/16/24 1141 120/80     Girls Systolic BP Percentile --      Girls Diastolic BP Percentile --      Boys Systolic BP Percentile --      Boys Diastolic BP Percentile --      Pulse Rate 01/16/24 1141 (!) 107     Resp 01/16/24 1141 18     Temp 01/16/24 1141 98.5 F (36.9 C)     Temp Source 01/16/24 1141 Oral     SpO2 01/16/24 1141 97 %     Weight --      Height --      Head Circumference --      Peak Flow --      Pain Score 01/16/24 1140 7     Pain Loc --      Pain Education --      Exclude from Growth Chart --    No data found.  Updated Vital Signs BP 120/80 (BP Location: Left Arm)   Pulse (!) 107   Temp 98.5 F (36.9 C) (Oral)   Resp 18   SpO2 97%   Visual Acuity Right Eye Distance:   Left Eye Distance:   Bilateral Distance:    Right Eye Near:   Left Eye Near:    Bilateral Near:     Physical Exam Vitals and nursing note reviewed.  Constitutional:      General: She is awake. She is not in acute distress.    Appearance: Normal appearance. She is well-developed and  well-groomed. She is not ill-appearing.  HENT:     Head:     Jaw: Tenderness and pain on movement present. No swelling.     Comments: Tenderness noted over left  TMJ Musculoskeletal:       Legs:  Skin:    General: Skin is warm and dry.     Findings: Wound present.     Comments: Approximately 2 mm scabbed over dry wound noted at the top of the intergluteal cleft.  Without redness, swelling, or drainage present.  Neurological:     Mental Status: She is alert.  Psychiatric:        Behavior: Behavior is cooperative.      UC Treatments / Results  Labs (all labs ordered are listed, but only abnormal results are displayed) Labs Reviewed - No data to display  EKG   Radiology No results found.  Procedures Procedures (including critical care time)  Medications Ordered in UC Medications - No data to display  Initial Impression / Assessment and Plan / UC Course  I have reviewed the triage vital signs and the nursing notes.  Pertinent labs & imaging results that were available during my care of the patient were reviewed by me and considered in my medical decision making (see chart for details).     Patient is overall well-appearing.  Vitals are stable.  Prescribed appears to admit for infection prevention related to wound.  No signs of infection at this time so deferred oral antibiotic treatment.  Prescribed 30-day supply of Celebrex to help with TMJ.  Discussed follow-up and return precautions Final Clinical Impressions(s) / UC Diagnoses   Final diagnoses:  Visit for wound check  Arthralgia of left temporomandibular joint     Discharge Instructions      You can apply the mupirocin  ointment twice daily to the wound on your bottom for infection prevention.  As discussed there are no signs of infection to this area today.  This will ultimately heal on its own.  Be sure to keep this area clean and dry as best you can. I have represcribed your Celebrex to take once daily to help with your jaw pain. Follow-up with your primary care provider for further evaluation if needed.   ED Prescriptions     Medication Sig Dispense Auth.  Provider   mupirocin  ointment (BACTROBAN ) 2 % Apply 1 Application topically 2 (two) times daily. 22 g Johnie Flaming A, NP   celecoxib (CELEBREX) 200 MG capsule Take 1 capsule (200 mg total) by mouth daily. 30 capsule Johnie Flaming A, NP      PDMP not reviewed this encounter.   Johnie Flaming A, NP 01/16/24 1212

## 2024-01-16 NOTE — Discharge Instructions (Signed)
 You can apply the mupirocin  ointment twice daily to the wound on your bottom for infection prevention.  As discussed there are no signs of infection to this area today.  This will ultimately heal on its own.  Be sure to keep this area clean and dry as best you can. I have represcribed your Celebrex to take once daily to help with your jaw pain. Follow-up with your primary care provider for further evaluation if needed.

## 2024-01-16 NOTE — ED Triage Notes (Signed)
 Patient states she would like her wound check. Patient states this an old wound. Patient also request something for her  TMJ.

## 2024-04-13 NOTE — Telephone Encounter (Signed)
 Called patient to see what questions she had in regards to water pill rx. Patient stated she needs a refill on her hydroCHLOROthiazide  <redacted file path>.

## 2024-04-17 ENCOUNTER — Emergency Department (HOSPITAL_COMMUNITY)

## 2024-04-17 ENCOUNTER — Emergency Department (HOSPITAL_COMMUNITY)
Admission: EM | Admit: 2024-04-17 | Discharge: 2024-04-18 | Disposition: A | Discharge status: Home / Self Care | Attending: Emergency Medicine | Admitting: Emergency Medicine

## 2024-04-17 ENCOUNTER — Other Ambulatory Visit: Payer: Self-pay

## 2024-04-17 ENCOUNTER — Encounter (HOSPITAL_COMMUNITY): Payer: Self-pay

## 2024-04-17 DIAGNOSIS — R109 Unspecified abdominal pain: Secondary | ICD-10-CM | POA: Diagnosis present

## 2024-04-17 DIAGNOSIS — R0981 Nasal congestion: Secondary | ICD-10-CM | POA: Diagnosis not present

## 2024-04-17 DIAGNOSIS — F419 Anxiety disorder, unspecified: Secondary | ICD-10-CM | POA: Insufficient documentation

## 2024-04-17 DIAGNOSIS — J029 Acute pharyngitis, unspecified: Secondary | ICD-10-CM | POA: Insufficient documentation

## 2024-04-17 DIAGNOSIS — R5383 Other fatigue: Secondary | ICD-10-CM | POA: Insufficient documentation

## 2024-04-17 DIAGNOSIS — Z79899 Other long term (current) drug therapy: Secondary | ICD-10-CM | POA: Diagnosis not present

## 2024-04-17 DIAGNOSIS — R531 Weakness: Secondary | ICD-10-CM | POA: Diagnosis not present

## 2024-04-17 DIAGNOSIS — R0602 Shortness of breath: Secondary | ICD-10-CM | POA: Diagnosis not present

## 2024-04-17 LAB — CBC WITH DIFFERENTIAL/PLATELET
Abs Immature Granulocytes: 0.01 K/uL (ref 0.00–0.07)
Basophils Absolute: 0 K/uL (ref 0.0–0.1)
Basophils Relative: 0 %
Eosinophils Absolute: 0 K/uL (ref 0.0–0.5)
Eosinophils Relative: 0 %
HCT: 43.7 % (ref 36.0–46.0)
Hemoglobin: 14.6 g/dL (ref 12.0–15.0)
Immature Granulocytes: 0 %
Lymphocytes Relative: 44 %
Lymphs Abs: 1.7 K/uL (ref 0.7–4.0)
MCH: 30.2 pg (ref 26.0–34.0)
MCHC: 33.4 g/dL (ref 30.0–36.0)
MCV: 90.5 fL (ref 80.0–100.0)
Monocytes Absolute: 0.3 K/uL (ref 0.1–1.0)
Monocytes Relative: 7 %
Neutro Abs: 1.9 K/uL (ref 1.7–7.7)
Neutrophils Relative %: 49 %
Platelets: 283 K/uL (ref 150–400)
RBC: 4.83 MIL/uL (ref 3.87–5.11)
RDW: 12.8 % (ref 11.5–15.5)
WBC: 3.8 K/uL — ABNORMAL LOW (ref 4.0–10.5)
nRBC: 0 % (ref 0.0–0.2)

## 2024-04-17 LAB — COMPREHENSIVE METABOLIC PANEL WITH GFR
ALT: 15 U/L (ref 0–44)
AST: 17 U/L (ref 15–41)
Albumin: 4.2 g/dL (ref 3.5–5.0)
Alkaline Phosphatase: 85 U/L (ref 38–126)
Anion gap: 11 (ref 5–15)
BUN: 19 mg/dL (ref 8–23)
CO2: 27 mmol/L (ref 22–32)
Calcium: 10.1 mg/dL (ref 8.9–10.3)
Chloride: 105 mmol/L (ref 98–111)
Creatinine, Ser: 0.68 mg/dL (ref 0.44–1.00)
GFR, Estimated: >60 mL/min
Glucose, Bld: 134 mg/dL — ABNORMAL HIGH (ref 70–99)
Potassium: 3.6 mmol/L (ref 3.5–5.1)
Sodium: 143 mmol/L (ref 135–145)
Total Bilirubin: 0.4 mg/dL (ref 0.0–1.2)
Total Protein: 7.5 g/dL (ref 6.5–8.1)

## 2024-04-17 LAB — URINALYSIS, W/ REFLEX TO CULTURE (INFECTION SUSPECTED)
Bilirubin Urine: NEGATIVE
Glucose, UA: >=500 mg/dL — AB
Ketones, ur: NEGATIVE mg/dL
Leukocytes,Ua: NEGATIVE
Nitrite: NEGATIVE
Protein, ur: NEGATIVE mg/dL
Specific Gravity, Urine: 1.022 (ref 1.005–1.030)
pH: 6 (ref 5.0–8.0)

## 2024-04-17 LAB — PRO BRAIN NATRIURETIC PEPTIDE: Pro Brain Natriuretic Peptide: <50 pg/mL

## 2024-04-17 LAB — I-STAT CG4 LACTIC ACID, ED
Lactic Acid, Venous: 0.9 mmol/L (ref 0.5–1.9)
Lactic Acid, Venous: 1.8 mmol/L (ref 0.5–1.9)

## 2024-04-17 LAB — TSH: TSH: 0.456 u[IU]/mL (ref 0.350–4.500)

## 2024-04-17 LAB — CBG MONITORING, ED: Glucose-Capillary: 145 mg/dL — ABNORMAL HIGH (ref 70–99)

## 2024-04-17 LAB — TROPONIN T, HIGH SENSITIVITY
Troponin T High Sensitivity: 19 ng/L (ref 0–19)
Troponin T High Sensitivity: 19 ng/L (ref 0–19)

## 2024-04-17 LAB — LIPASE, BLOOD: Lipase: 26 U/L (ref 11–51)

## 2024-04-17 MED ORDER — BUSPIRONE HCL 10 MG PO TABS: 5.0000 mg | ORAL_TABLET | Freq: Every day | ORAL | 0 refills | Status: AC

## 2024-04-17 MED ORDER — IOHEXOL 350 MG/ML SOLN
75.0000 mL | Freq: Once | INTRAVENOUS | Status: AC | PRN
  Administered 2024-04-17: 75 mL via INTRAVENOUS

## 2024-04-17 NOTE — ED Triage Notes (Signed)
 Pt to er via ems, per ems pt is here for general weakness and constipation.  States that she hasn't been able to walk since yesterday.  Per ems pt also c/o sore throat and some sinus congestion.

## 2024-04-17 NOTE — Discharge Instructions (Signed)
 Today's evaluation has been reassuring.  Your medications have been adjusted.  It is important to discuss this with your physician as soon as possible.  Return here for concerning changes in your condition.

## 2024-04-17 NOTE — ED Provider Triage Note (Signed)
 Emergency Medicine Provider Triage Evaluation Note  Lisa Meyers , a 69 y.o. female  was evaluated in triage.  Pt complains of abdominal distention with cramping with no bowel movement for 1 week, recent constipation, urinary frequency and dysuria, and exertional shortness of breath and palpitations that are painful..  Review of Systems  Positive: Abdominal distention, constipation, exertional shortness of breath, palpitations, urinary frequency Negative: No stabbing chest pain, nausea, vomiting, trauma.  Physical Exam  BP (!) 128/97 (BP Location: Right Arm)   Pulse 83   Temp 98.6 F (37 C) (Oral)   Resp 16   Ht 5' 5 (1.651 m)   Wt 59 kg   SpO2 100%   BMI 21.63 kg/m  Gen:   Awake, no distress   Resp:  Normal effort  MSK:   Moves extremities without difficulty  Other:  Possibly decreased bowel sounds on abdominal auscultation.  It is distended.  Medical Decision Making  Medically screening exam initiated at 2:12 PM.  Appropriate orders placed.  Lisa Meyers was informed that the remainder of the evaluation will be completed by another provider, this initial triage assessment does not replace that evaluation, and the importance of remaining in the ED until their evaluation is complete.  Lisa Meyers is a 69 y.o. female with a past medical history significant for hypertension, hyperlipidemia, diabetes, and previous hysterectomy who presents with several complaints including no bowel movement in 1 week with abdominal distention and abdominal cramping, worsening exertional shortness of breath with fatigue and painful palpitations, and urinary frequency and dysuria.  Patient reports her symptoms at all been ongoing for the last week.  She denies any trauma.  She denies any nausea or vomiting fevers, chills, congestion, or cough.  On exam, abdomen was not tender but it was fully distended and I did not hear good bowel sounds.  Back and flanks nontender.  Lungs were clear and I did not  appreciate a murmur.  No focal neurologic deficits initially.  Patient had nontender CVA areas and back.  Given the patient's abdominal distention, no bowel movement for a week, and the abdominal cramping, will get CT scan to rule out obstruction.  With her exertional shortness of breath and fatigue, we will get a cardiac workup and chest x-ray and a BNP as she did have some faint edema in her legs.  With her urinary frequency and dysuria will get urinalysis.  Will get other screening labs as well.  Anticipate reassessment when she is seen by provider for disposition and further workup.      Ilana Prezioso, Lonni PARAS, MD 04/17/24 [phone]

## 2024-04-17 NOTE — ED Provider Notes (Signed)
 "  EMERGENCY DEPARTMENT AT [facility] Provider Note   CSN: [id number] Arrival date & time: 04/17/24  1319     Patient presents with: Weakness   Lisa Meyers is a 69 y.o. female.  {Add pertinent medical, surgical, social history, OB history to HPI:32947} HPI ***    Prior to Admission medications  Medication Sig Start Date End Date Taking? Authorizing Provider  acetaminophen  (TYLENOL ) 500 MG tablet Take 1 tablet (500 mg total) by mouth every 6 (six) hours as needed. 11/20/23  Yes Nivia Colon, PA-C  celecoxib  (CELEBREX ) 200 MG capsule Take 1 capsule (200 mg total) by mouth daily. 01/16/24  Yes Johnie, Carley A, NP  diphenhydrAMINE (SOMINEX) 25 MG tablet Take 25 mg by mouth at bedtime as needed for sleep.   Yes [provider]  FARXIGA 5 MG TABS tablet Take 5 mg by mouth daily.   Yes [provider]  hydrochlorothiazide  (HYDRODIURIL ) 12.5 MG tablet Take 1 tablet by mouth once daily 11/16/21  Yes Paz, Jose E, MD  lisinopril  (ZESTRIL ) 20 MG tablet Take 1 tablet (20 mg total) by mouth daily. 12/10/21  Yes Paz, Jose E, MD  mupirocin  ointment (BACTROBAN ) 2 % Apply 1 Application topically 2 (two) times daily. 01/16/24  Yes Johnie Flaming A, NP  blood glucose meter kit and supplies Dispense based on patient and insurance preference. Check blood sugar once daily. 06/06/19   Paz, Jose E, MD  buPROPion ER (WELLBUTRIN SR) 100 MG 12 hr tablet Take 100 mg by mouth daily. Patient not taking: Reported on 04/17/2024 12/19/23   [provider]  busPIRone  (BUSPAR ) 10 MG tablet Take 0.5 tablets (5 mg total) by mouth daily. 04/17/24   Garrick Charleston, MD  gabapentin  (NEURONTIN ) 100 MG capsule Take 1 capsule (100 mg total) by mouth 3 (three) times daily. Patient not taking: Reported on 04/17/2024 11/20/23   Nivia Colon, PA-C  glucose blood [facility] ULTRA) test strip Check blood sugars once daily 08/21/19   Paz, Jose E, MD  lovastatin  (MEVACOR ) 20 MG tablet Take 1 tablet  (20 mg total) by mouth at bedtime. Patient not taking: Reported on 04/17/2024 02/23/21   Copland, Harlene BROCKS, MD  metFORMIN  (GLUCOPHAGE ) 1000 MG tablet Take 1 tablet (1,000 mg total) by mouth daily with breakfast. Patient not taking: Reported on 04/17/2024 08/13/21   Paz, Jose E, MD  Multiple Vitamins-Minerals (DAILY MULTIVITAMIN PO) Take by mouth. Patient not taking: Reported on 04/17/2024    [provider]  rosuvastatin  (CRESTOR ) 20 MG tablet Take 1 tablet (20 mg total) by mouth daily. Patient not taking: Reported on 04/17/2024 11/20/23   Nivia Colon, PA-C    Allergies: Claritin [loratadine], Lovastatin , Prednisone, and Rosuvastatin     Review of Systems  Updated Vital Signs BP 130/78   Pulse 69   Temp 97.7 F (36.5 C) (Oral)   Resp 20   Ht 1.651 m (5' 5)   Wt 59 kg   SpO2 100%   BMI 21.63 kg/m   Physical Exam  (all labs ordered are listed, but only abnormal results are displayed) Labs Reviewed  CBC WITH DIFFERENTIAL/PLATELET - Abnormal; Notable for the following components:      Result Value   WBC 3.8 (*)    All other components within normal limits  COMPREHENSIVE METABOLIC PANEL WITH GFR - Abnormal; Notable for the following components:   Glucose, Bld 134 (*)    All other components within normal limits  URINALYSIS, W/ REFLEX TO CULTURE (INFECTION SUSPECTED) -  Abnormal; Notable for the following components:   Glucose, UA >=500 (*)    Hgb urine dipstick SMALL (*)    Bacteria, UA RARE (*)    All other components within normal limits  CBG MONITORING, ED - Abnormal; Notable for the following components:   Glucose-Capillary 145 (*)    All other components within normal limits  TSH  PRO BRAIN NATRIURETIC PEPTIDE  LIPASE, BLOOD  I-STAT CG4 LACTIC ACID, ED  I-STAT CG4 LACTIC ACID, ED  TROPONIN T, HIGH SENSITIVITY  TROPONIN T, HIGH SENSITIVITY    EKG: EKG Interpretation Date/Time:  Tuesday April 17 2024 14:40:56 EST Ventricular Rate:  80 PR Interval:  140 QRS  Duration:  78 QT Interval:  362 QTC Calculation: 417 R Axis:   -43  Text Interpretation: Normal sinus rhythm Left axis deviation Confirmed by Garrick Charleston [phone]) on 04/17/2024 7:58:55 PM  Radiology: CT ABDOMEN PELVIS W CONTRAST Result Date: 04/17/2024 EXAM: CT ABDOMEN AND PELVIS WITH CONTRAST 04/17/2024 06:59:00 PM TECHNIQUE: CT of the abdomen and pelvis was performed with the administration of 75 mL of iohexol  (OMNIPAQUE ) 350 MG/ML injection. Multiplanar reformatted images are provided for review. Automated exposure control, iterative reconstruction, and/or weight-based adjustment of the mA/kV was utilized to reduce the radiation dose to as low as reasonably achievable. COMPARISON: None available. CLINICAL HISTORY: Bowel obstruction suspected; Palpitations, exertional shortness of breath, abdominal distention with constipation and no bowel movement in 1 week. Abdominal discomfort. FINDINGS: LOWER CHEST: No acute abnormality. LIVER: The liver is unremarkable. GALLBLADDER AND BILE DUCTS: Gallbladder is unremarkable. No biliary ductal dilatation. SPLEEN: No acute abnormality. PANCREAS: No acute abnormality. ADRENAL GLANDS: No acute abnormality. KIDNEYS, URETERS AND BLADDER: No stones in the kidneys or ureters. No hydronephrosis. No perinephric or periureteral stranding. Urinary bladder is unremarkable. GI AND BOWEL: Stomach demonstrates no acute abnormality. Large stool burden throughout the colon. No evidence of bowel obstruction. PERITONEUM AND RETROPERITONEUM: No ascites. No free air. VASCULATURE: Aorta is normal in caliber. LYMPH NODES: No lymphadenopathy. REPRODUCTIVE ORGANS: No acute abnormality. BONES AND SOFT TISSUES: No acute osseous abnormality. No focal soft tissue abnormality. IMPRESSION: 1. No evidence of bowel obstruction. 2. Large stool burden throughout the colon suggesting constipation . Electronically signed by: Franky Crease MD 04/17/2024 07:21 PM EST RP Workstation: [id number]   DG Chest  2 View Result Date: 04/17/2024 EXAM: 2 VIEW(S) XRAY OF THE CHEST 04/17/2024 03:07:00 PM COMPARISON: None available. CLINICAL HISTORY: Palpitations, exertional shortness of breath, abdominal distention with constipation and no bowel movement in 1 week. Abdominal discomfort. FINDINGS: LUNGS AND PLEURA: No focal pulmonary opacity. No pleural effusion. No pneumothorax. HEART AND MEDIASTINUM: No acute abnormality of the cardiac and mediastinal silhouettes. BONES AND SOFT TISSUES: Thoracic spondylosis. Mild lumbar dextroscoliosis. Disc space narrowing L4-5. ABDOMEN: The bowel gas pattern is normal, with mild stool in the colon. IMPRESSION: 1. No acute cardiopulmonary abnormality. Electronically signed by: Rogelia Myers MD 04/17/2024 03:42 PM EST RP Workstation: [id number]    {Document cardiac monitor, telemetry assessment procedure when appropriate:32947} Procedures   Medications Ordered in the ED  iohexol  (OMNIPAQUE ) 350 MG/ML injection 75 mL (75 mLs Intravenous Contrast Given 04/17/24 1855)      {Click here for ABCD2, HEART and other calculators REFRESH Note before signing:1}                              Medical Decision Making Risk Prescription drug management.   ***  {Document critical care time when appropriate  Document review of labs and clinical decision tools ie CHADS2VASC2, etc  Document your independent review of radiology images and any outside records  Document your discussion with family members, caretakers and with consultants  Document social determinants of health affecting pt's care  Document your decision making why or why not admission, treatments were needed:32947:::1}   Final diagnoses:  Weakness    ED Discharge Orders          Ordered    busPIRone  (BUSPAR ) 10 MG tablet  Daily        04/17/24 2319             "

## 2024-04-17 NOTE — ED Notes (Signed)
 Pt was assisted to the [facility]. No signs of distress noted. Pt tolerated well with minimal assistance.

## 2024-04-25 ENCOUNTER — Emergency Department (HOSPITAL_COMMUNITY)

## 2024-04-25 ENCOUNTER — Emergency Department (HOSPITAL_COMMUNITY)
Admission: EM | Admit: 2024-04-25 | Discharge: 2024-05-03 | Disposition: A | Discharge status: Home / Self Care | Attending: Emergency Medicine | Admitting: Emergency Medicine

## 2024-04-25 ENCOUNTER — Encounter (HOSPITAL_COMMUNITY): Payer: Self-pay | Admitting: Emergency Medicine

## 2024-04-25 DIAGNOSIS — Z7984 Long term (current) use of oral hypoglycemic drugs: Secondary | ICD-10-CM | POA: Insufficient documentation

## 2024-04-25 DIAGNOSIS — R9431 Abnormal electrocardiogram [ECG] [EKG]: Secondary | ICD-10-CM | POA: Insufficient documentation

## 2024-04-25 DIAGNOSIS — I10 Essential (primary) hypertension: Secondary | ICD-10-CM | POA: Insufficient documentation

## 2024-04-25 DIAGNOSIS — I959 Hypotension, unspecified: Secondary | ICD-10-CM | POA: Diagnosis not present

## 2024-04-25 DIAGNOSIS — E8770 Fluid overload, unspecified: Secondary | ICD-10-CM | POA: Insufficient documentation

## 2024-04-25 DIAGNOSIS — R2681 Unsteadiness on feet: Secondary | ICD-10-CM | POA: Diagnosis not present

## 2024-04-25 DIAGNOSIS — E119 Type 2 diabetes mellitus without complications: Secondary | ICD-10-CM | POA: Diagnosis not present

## 2024-04-25 DIAGNOSIS — G20C Parkinsonism, unspecified: Secondary | ICD-10-CM | POA: Diagnosis not present

## 2024-04-25 DIAGNOSIS — K5900 Constipation, unspecified: Secondary | ICD-10-CM | POA: Insufficient documentation

## 2024-04-25 DIAGNOSIS — R531 Weakness: Secondary | ICD-10-CM | POA: Insufficient documentation

## 2024-04-25 DIAGNOSIS — R638 Other symptoms and signs concerning food and fluid intake: Secondary | ICD-10-CM | POA: Diagnosis present

## 2024-04-25 DIAGNOSIS — Z789 Other specified health status: Secondary | ICD-10-CM | POA: Diagnosis not present

## 2024-04-25 DIAGNOSIS — Z602 Problems related to living alone: Secondary | ICD-10-CM | POA: Diagnosis not present

## 2024-04-25 DIAGNOSIS — I2489 Other forms of acute ischemic heart disease: Secondary | ICD-10-CM | POA: Insufficient documentation

## 2024-04-25 LAB — COMPREHENSIVE METABOLIC PANEL WITH GFR
ALT: 17 U/L (ref 0–44)
AST: 19 U/L (ref 15–41)
Albumin: 4 g/dL (ref 3.5–5.0)
Alkaline Phosphatase: 79 U/L (ref 38–126)
Anion gap: 8 (ref 5–15)
BUN: 26 mg/dL — ABNORMAL HIGH (ref 8–23)
CO2: 30 mmol/L (ref 22–32)
Calcium: 10.3 mg/dL (ref 8.9–10.3)
Chloride: 104 mmol/L (ref 98–111)
Creatinine, Ser: 0.64 mg/dL (ref 0.44–1.00)
GFR, Estimated: >60 mL/min
Glucose, Bld: 119 mg/dL — ABNORMAL HIGH (ref 70–99)
Potassium: 3.7 mmol/L (ref 3.5–5.1)
Sodium: 143 mmol/L (ref 135–145)
Total Bilirubin: 0.2 mg/dL (ref 0.0–1.2)
Total Protein: 7 g/dL (ref 6.5–8.1)

## 2024-04-25 LAB — URINALYSIS, ROUTINE W REFLEX MICROSCOPIC
Bacteria, UA: NONE SEEN
Bilirubin Urine: NEGATIVE
Glucose, UA: 50 mg/dL — AB
Hgb urine dipstick: NEGATIVE
Ketones, ur: NEGATIVE mg/dL
Nitrite: NEGATIVE
Protein, ur: NEGATIVE mg/dL
Specific Gravity, Urine: 1.033 — ABNORMAL HIGH (ref 1.005–1.030)
pH: 5 (ref 5.0–8.0)

## 2024-04-25 LAB — CBC WITH DIFFERENTIAL/PLATELET
Abs Immature Granulocytes: 0 K/uL (ref 0.00–0.07)
Basophils Absolute: 0 K/uL (ref 0.0–0.1)
Basophils Relative: 0 %
Eosinophils Absolute: 0 K/uL (ref 0.0–0.5)
Eosinophils Relative: 1 %
HCT: 41.1 % (ref 36.0–46.0)
Hemoglobin: 13.7 g/dL (ref 12.0–15.0)
Immature Granulocytes: 0 %
Lymphocytes Relative: 47 %
Lymphs Abs: 1.6 K/uL (ref 0.7–4.0)
MCH: 30.1 pg (ref 26.0–34.0)
MCHC: 33.3 g/dL (ref 30.0–36.0)
MCV: 90.3 fL (ref 80.0–100.0)
Monocytes Absolute: 0.3 K/uL (ref 0.1–1.0)
Monocytes Relative: 11 %
Neutro Abs: 1.3 K/uL — ABNORMAL LOW (ref 1.7–7.7)
Neutrophils Relative %: 41 %
Platelets: 243 K/uL (ref 150–400)
RBC: 4.55 MIL/uL (ref 3.87–5.11)
RDW: 12.6 % (ref 11.5–15.5)
WBC: 3.3 K/uL — ABNORMAL LOW (ref 4.0–10.5)
nRBC: 0 % (ref 0.0–0.2)

## 2024-04-25 LAB — I-STAT CG4 LACTIC ACID, ED: Lactic Acid, Venous: 1.5 mmol/L (ref 0.5–1.9)

## 2024-04-25 LAB — AMMONIA: Ammonia: 20 umol/L (ref 9–35)

## 2024-04-25 LAB — CBG MONITORING, ED: Glucose-Capillary: 110 mg/dL — ABNORMAL HIGH (ref 70–99)

## 2024-04-25 LAB — TSH: TSH: 0.93 u[IU]/mL (ref 0.350–4.500)

## 2024-04-25 LAB — PRO BRAIN NATRIURETIC PEPTIDE: Pro Brain Natriuretic Peptide: <50 pg/mL

## 2024-04-25 LAB — TROPONIN T, HIGH SENSITIVITY: Troponin T High Sensitivity: <15 ng/L (ref 0–19)

## 2024-04-25 MED ORDER — LISINOPRIL 10 MG PO TABS
20.0000 mg | ORAL_TABLET | Freq: Every day | ORAL | Status: DC
  Administered 2024-04-26 – 2024-04-28 (×3): 20 mg via ORAL
  Filled 2024-04-25 (×3): qty 2

## 2024-04-25 MED ORDER — DAPAGLIFLOZIN PROPANEDIOL 5 MG PO TABS
5.0000 mg | ORAL_TABLET | Freq: Every day | ORAL | Status: DC
  Administered 2024-04-26 – 2024-05-03 (×7): 5 mg via ORAL
  Filled 2024-04-25 (×8): qty 1

## 2024-04-25 MED ORDER — FLEET ENEMA RE ENEM: 1.0000 | ENEMA | Freq: Once | RECTAL | Status: DC

## 2024-04-25 MED ORDER — LACTATED RINGERS IV BOLUS
1000.0000 mL | Freq: Once | INTRAVENOUS | Status: AC
  Administered 2024-04-25: 1000 mL via INTRAVENOUS

## 2024-04-25 MED ORDER — HYDROCHLOROTHIAZIDE 12.5 MG PO TABS
12.5000 mg | ORAL_TABLET | Freq: Every day | ORAL | Status: DC
  Administered 2024-04-26 – 2024-04-28 (×3): 12.5 mg via ORAL
  Filled 2024-04-25 (×3): qty 1

## 2024-04-25 NOTE — ED Provider Notes (Signed)
 " [place] EMERGENCY DEPARTMENT AT [facility] Provider Note   CSN: [id number] Arrival date & time: 04/25/24  1652     Patient presents with: Failure To Thrive   Lisa Meyers is a 69 y.o. female with past medical history of HTN, diabetes, HLD, Parkinson's presents to Emergency Department for evaluation of decreased appetite, decreased oral intake, generalized weakness, constipation for past 2 weeks.    Patient lives at home alone in independent living.  Uses a walker to get around. Requests assistance with performing ADLs at home alone as she has no family nor assistance at home. Was last seen on 04/17/2024 for complaints of generalized weakness, abdominal pain.  She had CT of her abdomen notable for large stool burden but no other acute findings, reassuring lab and cardiac workup.  No signs of UTI.  Of note, APS was consulted for self-neglect, difficulty with medication compliance, difficulty with ADLs.  APS personally reviewed the home.  They noted that there was a odor of old food in the home. They note that patient does not eat a lot as she is unable to cook for herself as well as not drink a lot as she does not want to fall on the way to the bathroom.  She does not take her medications as prescribed.  They report that patient stated that she is afraid to live at home alone.  Family are not in contact with patient   HPI     Prior to Admission medications  Medication Sig Start Date End Date Taking? Authorizing Provider  acetaminophen  (TYLENOL ) 500 MG tablet Take 1 tablet (500 mg total) by mouth every 6 (six) hours as needed. 11/20/23   Nivia Colon, PA-C  blood glucose meter kit and supplies Dispense based on patient and insurance preference. Check blood sugar once daily. 06/06/19   Paz, Jose E, MD  buPROPion ER (WELLBUTRIN SR) 100 MG 12 hr tablet Take 100 mg by mouth daily. Patient not taking: Reported on 04/17/2024 12/19/23   [provider]  busPIRone  (BUSPAR ) 10 MG  tablet Take 0.5 tablets (5 mg total) by mouth daily. 04/17/24   Garrick Charleston, MD  celecoxib  (CELEBREX ) 200 MG capsule Take 1 capsule (200 mg total) by mouth daily. 01/16/24   Johnie Flaming A, NP  diphenhydrAMINE (SOMINEX) 25 MG tablet Take 25 mg by mouth at bedtime as needed for sleep.    [provider]  FARXIGA  5 MG TABS tablet Take 5 mg by mouth daily.    [provider]  gabapentin  (NEURONTIN ) 100 MG capsule Take 1 capsule (100 mg total) by mouth 3 (three) times daily. Patient not taking: Reported on 04/17/2024 11/20/23   Nivia Colon, PA-C  glucose blood [facility] ULTRA) test strip Check blood sugars once daily 08/21/19   Paz, Jose E, MD  hydrochlorothiazide  (HYDRODIURIL ) 12.5 MG tablet Take 1 tablet by mouth once daily 11/16/21   Paz, Jose E, MD  lisinopril  (ZESTRIL ) 20 MG tablet Take 1 tablet (20 mg total) by mouth daily. 12/10/21   Amon Aloysius BRAVO, MD  lovastatin  (MEVACOR ) 20 MG tablet Take 1 tablet (20 mg total) by mouth at bedtime. Patient not taking: Reported on 04/17/2024 02/23/21   Copland, Harlene BROCKS, MD  metFORMIN  (GLUCOPHAGE ) 1000 MG tablet Take 1 tablet (1,000 mg total) by mouth daily with breakfast. Patient not taking: Reported on 04/17/2024 08/13/21   Paz, Jose E, MD  Multiple Vitamins-Minerals (DAILY MULTIVITAMIN PO) Take by mouth. Patient not taking: Reported on 04/17/2024  [provider]  mupirocin  ointment (BACTROBAN ) 2 % Apply 1 Application topically 2 (two) times daily. 01/16/24   Johnie Flaming A, NP  rosuvastatin  (CRESTOR ) 20 MG tablet Take 1 tablet (20 mg total) by mouth daily. Patient not taking: Reported on 04/17/2024 11/20/23   Nivia Colon, PA-C    Allergies: Claritin [loratadine], Lovastatin , Prednisone, and Rosuvastatin     Review of Systems  Skin:  Positive for wound.    Updated Vital Signs BP 107/73   Pulse 69   Temp 98 F (36.7 C)   Resp 14   SpO2 100%   Physical Exam Vitals and nursing note reviewed.  Constitutional:      General:  She is not in acute distress.    Appearance: Normal appearance.  HENT:     Head: Normocephalic and atraumatic.  Eyes:     Conjunctiva/sclera: Conjunctivae normal.  Cardiovascular:     Rate and Rhythm: Normal rate.  Pulmonary:     Effort: Pulmonary effort is normal. No respiratory distress.     Breath sounds: Normal breath sounds.  Abdominal:     General: Bowel sounds are normal. There is no distension.     Palpations: Abdomen is soft.     Tenderness: There is no abdominal tenderness. There is no guarding or rebound.  Genitourinary:    Comments: Normal rectal tone. Small amount of brown stool on DRE. No impacted stool in rectum Skin:    Coloration: Skin is not jaundiced or pale.  Neurological:     Mental Status: She is alert. Mental status is at baseline.   GU exam chaperoned by Grayce Rocher, RN  (all labs ordered are listed, but only abnormal results are displayed) Labs Reviewed  CBC WITH DIFFERENTIAL/PLATELET - Abnormal; Notable for the following components:      Result Value   WBC 3.3 (*)    Neutro Abs 1.3 (*)    All other components within normal limits  COMPREHENSIVE METABOLIC PANEL WITH GFR - Abnormal; Notable for the following components:   Glucose, Bld 119 (*)    BUN 26 (*)    All other components within normal limits  URINALYSIS, ROUTINE W REFLEX MICROSCOPIC - Abnormal; Notable for the following components:   Specific Gravity, Urine 1.033 (*)    Glucose, UA 50 (*)    Leukocytes,Ua TRACE (*)    All other components within normal limits  CBG MONITORING, ED - Abnormal; Notable for the following components:   Glucose-Capillary 110 (*)    All other components within normal limits  AMMONIA  TSH  PRO BRAIN NATRIURETIC PEPTIDE  I-STAT CG4 LACTIC ACID, ED  TROPONIN T, HIGH SENSITIVITY  TROPONIN T, HIGH SENSITIVITY    EKG: EKG Interpretation Date/Time:  Wednesday April 25 2024 19:34:03 EST Ventricular Rate:  77 PR Interval:  170 QRS Duration:  70 QT  Interval:  426 QTC Calculation: 482 R Axis:   24  Text Interpretation: Normal sinus rhythm Cannot rule out Anterior infarct , age undetermined Abnormal ECG No significant change since last tracing Confirmed by Emil Share [id number]) on 04/25/2024 10:58:52 PM  Radiology: CT Head Wo Contrast Result Date: 04/25/2024 CLINICAL DATA:  Altered mental status EXAM: CT HEAD WITHOUT CONTRAST TECHNIQUE: Contiguous axial images were obtained from the base of the skull through the vertex without intravenous contrast. RADIATION DOSE REDUCTION: This exam was performed according to the departmental dose-optimization program which includes automated exposure control, adjustment of the mA and/or kV according to patient size and/or use of iterative reconstruction technique.  COMPARISON:  CT 04/28/2023 FINDINGS: Brain: No acute territorial infarction, hemorrhage or intracranial mass. The ventricles are nonenlarged Vascular: No hyperdense vessels.  No unexpected calcification Skull: Normal. Negative for fracture or focal lesion. Sinuses/Orbits: No acute finding. Other: None IMPRESSION: Negative non contrasted CT appearance of the brain. Electronically Signed   By: Luke Bun M.D.   On: 04/25/2024 18:52    Medications Ordered in the ED  sodium phosphate (FLEET) enema 1 enema (has no administration in time range)  dapagliflozin  propanediol (FARXIGA ) tablet 5 mg (has no administration in time range)  hydrochlorothiazide  (HYDRODIURIL ) tablet 12.5 mg (has no administration in time range)  lisinopril  (ZESTRIL ) tablet 20 mg (has no administration in time range)  lactated ringers  bolus 1,000 mL (1,000 mLs Intravenous New Bag/Given 04/25/24 2305)    Clinical Course as of 04/25/24 2321  Wed Apr 25, 2024  2300 Specific Gravity, Urine(!): 1.033 Provided IVF for hydration [LB]    Clinical Course User Index [LB] Minnie Tinnie BRAVO, PA                                 Medical Decision Making Amount and/or Complexity of Data  Reviewed Labs: ordered. Decision-making details documented in ED Course. Radiology: ordered.  Risk OTC drugs.   Patient presents to the ED for concern of general weakness, constipation, this involves an extensive number of treatment options, and is a complaint that carries with it a high risk of complications and morbidity.  The differential diagnosis includes perforation, obstruction, electrolyte abnormality, UTI, pneumonia, sepsis, electrolyte abnormality, fecal impaction, ACS, medication noncompliance, dehydration   Co morbidities that complicate the patient evaluation  See HPI   Additional history obtained:  Additional history obtained from Nursing and Outside Medical Records   External records from outside source obtained and reviewed including triage RN note, ED note from 04/17/2024   Lab Tests:  I Ordered, and personally interpreted labs.  The pertinent results include:   Troponin negative BNP negative CBG 119 UA without infection No leukocytosis nor lactic acidosis Ammonia WNL TSH WNL   Imaging Studies ordered:  I ordered imaging studies including CT head I independently visualized and interpreted imaging which showed acute intracranial abnormalities I agree with the radiologist interpretation   Cardiac Monitoring:  The patient was maintained on a cardiac monitor.  I personally viewed and interpreted the cardiac monitored which showed an underlying rhythm of: NSR with no ischemic changes   Medicines ordered and prescription drug management:  I ordered medication including prescription medicine of lisinopril , HCTZ, Farxiga , enema for constipation Reevaluation of the patient after these medicines showed that the patient improved I have reviewed the patients home medicines and have made adjustments as needed    Consultations Obtained:  I requested consultation with TOC,  and discussed lab and imaging findings as well as pertinent plan -  pending    Problem List / ED Course:  Generalized weakness Vital signs hemodynamically stable with no fever no tachycardia EKG without ischemic changes.  Troponin negative.  Low suspicion for ACS as etiology of weakness Does not appear fluid overloaded.  BNP negative.  Chest x-ray without fluid No signs of sepsis.  No fever no tachycardia.  No lactic acidosis.  No leukocytosis nor anion gap CBG 110 UA without infection nor Hgb.  No signs of UTI Ammonia WNL.  TSH WNL.  No significant electrolyte abnormality Has no focal deficits.  No speech or visual deficits.  Denies dizziness.  CT without intracranial abnormalities.  Low suspicion for CVA/TIA Patient reports that she has not been eating or drinking as she does not want to make food for herself and does not want to drink much as it causes her to urinate.  This is likely contributing to generalized weakness  Constipation No fecal impaction on DRE. CT abdomen obtained on 1/26 notable for large stool burden. No abdominal tenderness.  Is passing gas.  Low suspicion for obstruction nor perforation Patient reports that she has had small bowel movements since being evaluated a week ago. Will provide enema  Difficulty performing ADLs Spoke to APS has been reported for self-neglect, difficulty with medication compliance, difficulty eating, difficulty walking, difficulty drinking water.  They note spoiled food in the home.  Ultimately, they are not currently looking for [facility], possible placement for patient as she has difficulty performing ADLs by herself Family is not in contact with patient so they are unable to assist with her PT OT pending.  TOC pending Reordered patient's home medications although it looks like she does not take a lot of them at home as she is suspicious of medications. Ordered heart healthy diet Medically clear and awaiting TOC, PT, OT   Reevaluation:  After the interventions noted above, I reevaluated the patient and  found that they have :improved    Dispostion:  After consideration of the diagnostic results and the patients response to treatment, the patient is medically clear.  She is stable for discharge.  However, due to difficulty performing ADLs at home, will obtain PT, OT, TOC consult to ensure patient is safe at home.  Patient likely to benefit from [facility] or even placement  Discussed ED workup, disposition with patient expressed understand agrees with plan.  All questions answered to patient's satisfaction.  Final diagnoses:  Constipation, unspecified constipation type  Difficulty performing activity of daily living (ADL)    ED Discharge Orders     None        Minnie Tinnie BRAVO, PA 04/25/24 2323    Emil Share, DO 04/25/24 2324  "

## 2024-04-25 NOTE — ED Notes (Signed)
 Pt given a snack and drink ?

## 2024-04-25 NOTE — ED Triage Notes (Addendum)
 BIB EMS from home.  APS requested eval as pt has not been eating or drinking and not making urine.  Lives alone.  Hx of parkinson's   Uses walker. Lives on 2nd floor  Family lives in [place] and per APS are possibly not in contact.  VSS 104/68, 80 HR, CBG 157  APS social worker requests call from provider , Corean Rubinstein [phone]

## 2024-04-26 LAB — RESP PANEL BY RT-PCR (RSV, FLU A&B, COVID)  RVPGX2
Influenza A by PCR: NEGATIVE
Influenza B by PCR: NEGATIVE
Resp Syncytial Virus by PCR: NEGATIVE
SARS Coronavirus 2 by RT PCR: NEGATIVE

## 2024-04-26 MED ORDER — BUSPIRONE HCL 10 MG PO TABS
5.0000 mg | ORAL_TABLET | Freq: Every day | ORAL | Status: DC
  Administered 2024-04-27 – 2024-05-03 (×7): 5 mg via ORAL
  Filled 2024-04-26 (×7): qty 1

## 2024-04-26 MED ORDER — ACETAMINOPHEN 325 MG PO TABS
650.0000 mg | ORAL_TABLET | Freq: Four times a day (QID) | ORAL | Status: DC | PRN
  Administered 2024-04-27 – 2024-04-30 (×2): 650 mg via ORAL
  Filled 2024-04-26 (×4): qty 2

## 2024-04-26 MED ORDER — BUSPIRONE HCL 10 MG PO TABS
10.0000 mg | ORAL_TABLET | Freq: Every day | ORAL | Status: DC
  Administered 2024-04-26: 5 mg via ORAL
  Filled 2024-04-26: qty 1

## 2024-04-26 NOTE — Progress Notes (Signed)
 Pt does has not bed offers at this time. APS informed via email.   Per APS Supervisor, Jessika Blue, they are filing an ex parte order and will have a hearing tomorrow. She also reported the Medicaid app is in motion. CSW informed pt was denied by [employer]lean Milian and [employer] due to their asset search showing pt has 3 vehicles in her name.
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
## 2024-04-26 NOTE — ED Notes (Signed)
I gave patient her breakfast tray 

## 2024-04-26 NOTE — Evaluation (Signed)
 Occupational Therapy Evaluation Patient Details Name: Lisa Meyers MRN: [id number] DOB: 11-17-1955 Today's Date: 04/26/2024   History of Present Illness   Lisa Meyers is a 69 y.o. female presents to Emergency Department 1`/14/25  for evaluation of decreased appetite, decreased oral intake, generalized weakness, constipation for past 2 weeks. Was in ED 1/6 for abdominal pain/constipation  and DC'd Past medical history of HTN, diabetes, HLD, Parkinson's     Clinical Impressions PTA, patient lives alone and does not have local family (out of town son) and amb with rollator, uses DME for showers but has had some slips to the floor in the past month.  Currently, patient presents with deficits outlined below (see OT Problem List for details) most significantly pain, decreased activity tolerance, balance, motor planning and coordination, generalized muscle weakness and lability limiting ADL's (mod-max A LB) and functioal mobility (min-mod A RW) performance. Patient will benefit from continued inpatient follow up therapy, <3 hours/day. Patient requires continued Acute care hospital level OT services to progress safety and functional performance and allow for discharge.       If plan is discharge home, recommend the following:   A lot of help with walking and/or transfers;A lot of help with bathing/dressing/bathroom;Assistance with cooking/housework;Assist for transportation;Help with stairs or ramp for entrance     Functional Status Assessment   Patient has had a recent decline in their functional status and demonstrates the ability to make significant improvements in function in a reasonable and predictable amount of time.     Equipment Recommendations   Tub/shower seat      Precautions/Restrictions   Precautions Precautions: Fall Precaution/Restrictions Comments: significant right leg and  arm tremors Restrictions Weight Bearing Restrictions Per Provider Order: No      Mobility Bed Mobility Overal bed mobility: Needs Assistance Bed Mobility: Rolling, Sidelying to Sit, Sit to Sidelying Rolling: Contact guard assist Sidelying to sit: Min assist     Sit to sidelying: Min assist General bed mobility comments: rolls slowly able to initiate leg flexion, min suport to fully sit, trunk  stiff.    Transfers Overall transfer level: Needs assistance Equipment used: Rolling walker (2 wheels) Transfers: Sit to/from Stand, Bed to chair/wheelchair/BSC Sit to Stand: Min assist, Mod assist     Step pivot transfers: Min assist     General transfer comment: stand slowly, cues for hand plaement on the RW grips, min assist to mod assist for  turning, increased festinating gait, especially when pivots from [facility] to recliner.      Balance Overall balance assessment: History of Falls, Needs assistance Sitting-balance support: Feet supported, Bilateral upper extremity supported Sitting balance-Leahy Scale: Fair     Standing balance support: Reliant on assistive device for balance, During functional activity, Bilateral upper extremity supported Standing balance-Leahy Scale: Poor                             ADL either performed or assessed with clinical judgement   ADL Overall ADL's : Needs assistance/impaired Eating/Feeding: Set up;Bed level   Grooming: Wash/dry hands;Wash/dry face;Set up;Sitting   Upper Body Bathing: Minimal assistance;Sitting   Lower Body Bathing: Maximal assistance;Sitting/lateral leans;Sit to/from stand;Cueing for sequencing   Upper Body Dressing : Minimal assistance;Sitting   Lower Body Dressing: Maximal assistance;Sit to/from stand   Toilet Transfer: Minimal assistance;Moderate assistance;Cueing for safety;Cueing for sequencing;BSC/3in1;Rolling walker (2 wheels)   Toileting- Clothing Manipulation and Hygiene: Maximal assistance;Sitting/lateral lean  Functional mobility during ADLs: Minimal assistance;Moderate  assistance;Rolling walker (2 wheels) General ADL Comments: cues and assist for fatigue, coord, limited functional reach     Vision Baseline Vision/History: 0 No visual deficits Ability to See in Adequate Light: 0 Adequate Patient Visual Report: No change from baseline          Praxis Praxis: Impaired Praxis Impairment Details: Motor planning     Pertinent Vitals/Pain Pain Assessment Pain Assessment: Faces Faces Pain Scale: Hurts little more Pain Location: right leg Pain Descriptors / Indicators: Guarding, Grimacing Pain Intervention(s): Monitored during session, Repositioned     Extremity/Trunk Assessment Upper Extremity Assessment Upper Extremity Assessment: Generalized weakness;Right hand dominant (no UE tremors noted)   Lower Extremity Assessment Lower Extremity Assessment: Defer to PT evaluation RLE Deficits / Details: intermittent stong extensor tremors, cannot break out of the   extension until tremor stops. Able to slowly flex the  hip and leg when rolled to  side indicates some pain  in the  leg I twisted it when I slid to the floor. LLE Deficits / Details: some rigidity but no trmors, slow to flex   Cervical / Trunk Assessment Cervical / Trunk Assessment: Normal   Communication Communication Communication: Impaired Factors Affecting Communication: Reduced clarity of speech   Cognition Arousal: Alert Behavior During Therapy: Anxious, Lability Cognition: No apparent impairments             OT - Cognition Comments: overwhelment at times limits processing and higher level attention but Lisa Meyers for session                 Following commands: Intact, Impaired Following commands impaired: Only follows one step commands consistently, Follows one step commands with increased time     Cueing  General Comments   Cueing Techniques: Verbal cues  no skin issues, on RA with VSS           Home Living Family/patient expects to be discharged to:: Private  residence Living Arrangements: Alone   Type of Home: Apartment Home Access: Stairs to enter Entrance Stairs-Number of Steps: 8 Entrance Stairs-Rails: Right;Left Home Layout: One level     Bathroom Shower/Tub: Tub/shower unit         Home Equipment: Rollator (4 wheels);Shower seat   Additional Comments: slid to floor in BR  and able to get self up, reports shower seat is broken      Prior Functioning/Environment Prior Level of Function : Needs assist             Mobility Comments: uses a rollator ADLs Comments: gets into shower with difficulty    OT Problem List: Decreased strength;Decreased activity tolerance;Impaired balance (sitting and/or standing);Decreased coordination;Decreased safety awareness;Decreased knowledge of use of DME or AE;Decreased knowledge of precautions;Cardiopulmonary status limiting activity;Pain   OT Treatment/Interventions: Self-care/ADL training;Therapeutic exercise;Neuromuscular education;Energy conservation;DME and/or AE instruction;Therapeutic activities;Patient/family education;Balance training      OT Goals(Current goals can be found in the care plan section)   Acute Rehab OT Goals Patient Stated Goal: to feel better OT Goal Formulation: With patient Time For Goal Achievement: 05/10/24 Potential to Achieve Goals: Fair ADL Goals Pt Will Perform Lower Body Bathing: with supervision;sit to/from stand Pt Will Perform Lower Body Dressing: with supervision;sit to/from stand Pt Will Transfer to Toilet: with supervision;ambulating Pt Will Perform Toileting - Clothing Manipulation and hygiene: with supervision;sit to/from stand Pt Will Perform Tub/Shower Transfer: Shower transfer;ambulating;shower seat;with contact guard assist Pt/caregiver will Perform Home Exercise Program: Increased strength;Both right and left upper extremity;With  written HEP provided;With Supervision   OT Frequency:  Min 2X/week    Co-evaluation     PT goals  addressed during session: Mobility/safety with mobility OT goals addressed during session: ADL's and self-care      AM-PAC OT 6 Clicks Daily Activity     Outcome Measure Help from another person eating meals?: A Little Help from another person taking care of personal grooming?: A Little Help from another person toileting, which includes using toliet, bedpan, or urinal?: A Lot Help from another person bathing (including washing, rinsing, drying)?: A Lot Help from another person to put on and taking off regular upper body clothing?: A Lot Help from another person to put on and taking off regular lower body clothing?: A Lot 6 Click Score: 14   End of Session Equipment Utilized During Treatment: Gait belt;Rolling walker (2 wheels) Nurse Communication: Mobility status;Other (comment) (BM data added to flowsheets)  Activity Tolerance: Patient limited by fatigue Patient left: in bed;with call bell/phone within reach  OT Visit Diagnosis: Unsteadiness on feet (R26.81);Other abnormalities of gait and mobility (R26.89);History of falling (Z91.81);Muscle weakness (generalized) (M62.81);Ataxia, unspecified (R27.0);Pain Pain - Right/Left: Left Pain - part of body: Leg                Time: 9149-9089 OT Time Calculation (min): 20 min Charges:  OT General Charges $OT Visit: 1 Visit OT Evaluation $OT Eval Low Complexity: 1 Low  Kol Consuegra OT/L Acute Rehabilitation Department  [phone]  04/26/2024, 10:58 AM

## 2024-04-26 NOTE — Evaluation (Signed)
 Physical Therapy Evaluation Patient Details Name: Lisa Meyers MRN: [id number] DOB: December 12, 1955 Today's Date: 04/26/2024  History of Present Illness  Lisa Meyers is a 69 y.o. female presents to Emergency Department 1`/14/25  for evaluation of decreased appetite, decreased oral intake, generalized weakness, constipation for past 2 weeks. Was in ED 1/6 for abdominal pain/constipation  and DC'd Past medical history of HTN, diabetes, HLD, Parkinson's  Clinical Impression  Pt admitted with above diagnosis.  Pt currently with functional limitations due to the deficits listed below (see PT Problem List). Pt will benefit from acute skilled PT to increase their independence and safety with mobility to allow discharge.      The patient is  alert, labile at times, perseverates on the Buspar  medication, Patient reports right extremities with increased tremors with her anxiety.  Patient was able to  roll and mobilize to sitting and ambulate x 60' using RW, required steady assist and assist to turn RW( pt, uses rollator). Patient reports has slid to floor, No fallen and able to get self up.  Patient has no assistance form any agency, family lives out of town`  Patient will benefit from continued inpatient follow up therapy, <3 hours/day   VSS.      If plan is discharge home, recommend the following: A little help with walking and/or transfers;A little help with bathing/dressing/bathroom;Help with stairs or ramp for entrance;Assist for transportation;Assistance with cooking/housework   Can travel by private vehicle        Equipment Recommendations None recommended by PT  Recommendations for Other Services       Functional Status Assessment Patient has had a recent decline in their functional status and demonstrates the ability to make significant improvements in function in a reasonable and predictable amount of time.     Precautions / Restrictions Precautions Precautions:  Fall Precaution/Restrictions Comments: significant right leg and  arm tremors Restrictions Weight Bearing Restrictions Per Provider Order: No      Mobility  Bed Mobility Overal bed mobility: Needs Assistance Bed Mobility: Rolling, Sidelying to Sit, Sit to Sidelying Rolling: Contact guard assist Sidelying to sit: Min assist     Sit to sidelying: Min assist General bed mobility comments: rolls slowly able to initiate leg flexion, min suport to fully sit, trunk  stiffness    Transfers Overall transfer level: Needs assistance Equipment used: Rolling walker (2 wheels) Transfers: Sit to/from Stand, Bed to chair/wheelchair/BSC Sit to Stand: Min assist, Mod assist   Step pivot transfers: Min assist       General transfer comment: stand slowly, cues for hand plaement on the RW grips, min assist to mod assist for  turning RW, increased festinating gait, especially when pivots to [facility] and  from [facility] to bed.    Ambulation/Gait Ambulation/Gait assistance: Min assist Gait Distance (Feet): 60 Feet Assistive device: Rolling walker (2 wheels) Gait Pattern/deviations: Step-to pattern, Festinating, Step-through pattern, Trunk flexed Gait velocity: decr     General Gait Details: multimodal cues to  stand more erect , assist  with turning  180* using 2 WRW  Stairs            Wheelchair Mobility     Tilt Bed    Modified Rankin (Stroke Patients Only)       Balance Overall balance assessment: History of Falls, Needs assistance Sitting-balance support: Feet supported, Bilateral upper extremity supported Sitting balance-Leahy Scale: Fair     Standing balance support: Reliant on assistive device for balance, During functional activity, Bilateral  upper extremity supported Standing balance-Leahy Scale: Poor                               Pertinent Vitals/Pain Pain Assessment Pain Assessment: Faces Faces Pain Scale: Hurts little more Pain Location: right leg Pain  Descriptors / Indicators: Guarding, Grimacing Pain Intervention(s): Monitored during session    Home Living Family/patient expects to be discharged to:: Private residence Living Arrangements: Alone   Type of Home: Apartment Home Access: Stairs to enter Entrance Stairs-Rails: Doctor, [facility] of Steps: 8   Home Layout: One level Home Equipment: Rollator (4 wheels);Shower seat Additional Comments: slid to floor in BR  and able to get self up    Prior Function Prior Level of Function : Needs assist             Mobility Comments: uses a rollator ADLs Comments: gets into shower with difficulty     Extremity/Trunk Assessment   Upper Extremity Assessment Upper Extremity Assessment: Right hand dominant    Lower Extremity Assessment Lower Extremity Assessment: LLE deficits/detail RLE Deficits / Details: intermittent stong extensor tremors, cannot break out of the   extension until tremor stops. Able to slowly flex the  hip and leg when rolled to  side indicates some pain  in the  leg I twisted it when I slid to the floor. LLE Deficits / Details: some rigidity but no trmors, slow to flex    Cervical / Trunk Assessment Cervical / Trunk Assessment: Normal  Communication   Communication Communication: Impaired Factors Affecting Communication: Reduced clarity of speech    Cognition Arousal: Alert Behavior During Therapy: Anxious, Lability   PT - Cognitive impairments: No family/caregiver present to determine baseline                       PT - Cognition Comments: oriented to hospital,  jan, not date. Some difficulty giving  mobility history, perseverating on her BUspar  medication Following commands: Intact, Impaired Following commands impaired: Only follows one step commands consistently, Follows one step commands with increased time     Cueing Cueing Techniques: Verbal cues     General Comments      Exercises     Assessment/Plan    PT  Assessment Patient needs continued PT services  PT Problem List Decreased strength;Decreased range of motion;Decreased activity tolerance;Decreased balance;Decreased mobility;Decreased cognition;Decreased safety awareness;Pain       PT Treatment Interventions DME instruction;Gait training;Functional mobility training;Therapeutic activities;Therapeutic exercise;Balance training;Patient/family education    PT Goals (Current goals can be found in the Care Plan section)  Acute Rehab PT Goals Patient Stated Goal: agreed on rehab PT Goal Formulation: With patient Time For Goal Achievement: 05/10/24 Potential to Achieve Goals: Fair    Frequency Min 2X/week     Co-evaluation PT/OT/SLP Co-Evaluation/Treatment: Yes   PT goals addressed during session: Mobility/safety with mobility OT goals addressed during session: ADL's and self-care       AM-PAC PT 6 Clicks Mobility  Outcome Measure Help needed turning from your back to your side while in a flat bed without using bedrails?: A Little Help needed moving from lying on your back to sitting on the side of a flat bed without using bedrails?: A Little Help needed moving to and from a bed to a chair (including a wheelchair)?: A Lot Help needed standing up from a chair using your arms (e.g., wheelchair or bedside chair)?: A Lot Help needed  to walk in hospital room?: A Lot Help needed climbing 3-5 steps with a railing? : Total 6 Click Score: 13    End of Session Equipment Utilized During Treatment: Gait belt Activity Tolerance: Patient tolerated treatment well Patient left: in bed;with call bell/phone within reach Nurse Communication: Mobility status PT Visit Diagnosis: Unsteadiness on feet (R26.81)    Time: 9155-9077 PT Time Calculation (min) (ACUTE ONLY): 38 min   Low eval        Darice Potters PT Acute Rehabilitation Services Office [phone]   Potters Darice Norris 04/26/2024, 10:06 AM

## 2024-04-26 NOTE — ED Notes (Signed)
 Patient is concerned that the meds she is receiving are the ones that she needs to get.  Reassured patient by letting her know which medicines she is receiving and dosing.

## 2024-04-26 NOTE — Progress Notes (Addendum)
 CSW received email from APS SW who reports they are seeking LTC placement for patient as she is unable to care for herself at home and there is no family assistance.   Per chart review, PT/O evals are pending.   Addend @ 11:21AM PT/OT rec SNF. Pt faxed out. Bed offers pending.

## 2024-04-26 NOTE — NC FL2 (Signed)
 " [place]  MEDICAID FL2 LEVEL OF CARE FORM     IDENTIFICATION  Patient Name: Lisa Meyers Birthdate: 08-Oct-1955 Sex: female Admission Date (Current Location): 04/25/2024  [facility] and [place] Number:  Producer, Television/film/video and Address:  [facility],  [address] [place], [place] [address]      Provider Number: [id number]  Attending Physician Name and Address:  Armenta Canning, MD  Relative Name and Phone Number:       Current Level of Care: Hospital Recommended Level of Care: Skilled Nursing Facility Prior Approval Number:    Date Approved/Denied:   PASRR Number: [id number]  Discharge Plan: SNF    Current Diagnoses: Patient Active Problem List   Diagnosis Date Noted   Ear fullness, bilateral 11/04/2021   Temporomandibular joint dysfunction 11/04/2021   Abnormal auditory perception of both ears 11/04/2021   Annual physical exam 11/26/2016   PCP NOTES >>>>>>>>>>>>> 05/31/2016   Hypertension 02/11/2014   Diabetes mellitus without complication (HCC) 02/11/2014   Hyperlipidemia 02/11/2014   PPD positive 02/11/2014    Orientation RESPIRATION BLADDER Height & Weight     Self, Situation, Place  Normal Incontinent Weight:   Height:     BEHAVIORAL SYMPTOMS/MOOD NEUROLOGICAL BOWEL NUTRITION STATUS      Incontinent Diet  AMBULATORY STATUS COMMUNICATION OF NEEDS Skin   Limited Assist Verbally Normal                       Personal Care Assistance Level of Assistance  Bathing, Feeding, Dressing Bathing Assistance: Limited assistance Feeding assistance: Limited assistance Dressing Assistance: Limited assistance     Functional Limitations Info  Sight, Hearing, Speech Sight Info: Adequate Hearing Info: Adequate      SPECIAL CARE FACTORS FREQUENCY  PT (By licensed PT), OT (By licensed OT)     PT Frequency: x5/week OT Frequency: x5/week            Contractures Contractures Info: Not present    Additional Factors Info  Code Status, Allergies  Code Status Info: Full Allergies Info: Claritin (Loratadine)  Lovastatin   Prednisone  Rosuvastatin            Current Medications (04/26/2024):  This is the current hospital active medication list Current Facility-Administered Medications  Medication Dose Route Frequency Provider Last Rate Last Admin   dapagliflozin  propanediol (FARXIGA ) tablet 5 mg  5 mg Oral Daily Baron, Lauren E, PA       hydrochlorothiazide  (HYDRODIURIL ) tablet 12.5 mg  12.5 mg Oral Daily Baron, Lauren E, PA   12.5 mg at 04/26/24 1118   lisinopril  (ZESTRIL ) tablet 20 mg  20 mg Oral Daily Baron, Lauren E, PA       sodium phosphate (FLEET) enema 1 enema  1 enema Rectal Once Baron, Lauren E, PA       Current Outpatient Medications  Medication Sig Dispense Refill   acetaminophen  (TYLENOL ) 500 MG tablet Take 1 tablet (500 mg total) by mouth every 6 (six) hours as needed. 30 tablet 0   blood glucose meter kit and supplies Dispense based on patient and insurance preference. Check blood sugar once daily. 1 each 0   buPROPion ER (WELLBUTRIN SR) 100 MG 12 hr tablet Take 100 mg by mouth daily. (Patient not taking: Reported on 04/17/2024)     busPIRone  (BUSPAR ) 10 MG tablet Take 0.5 tablets (5 mg total) by mouth daily. 30 tablet 0   celecoxib  (CELEBREX ) 200 MG capsule Take 1 capsule (200 mg total) by mouth daily. 30  capsule 0   diphenhydrAMINE (SOMINEX) 25 MG tablet Take 25 mg by mouth at bedtime as needed for sleep.     FARXIGA  5 MG TABS tablet Take 5 mg by mouth daily.     gabapentin  (NEURONTIN ) 100 MG capsule Take 1 capsule (100 mg total) by mouth 3 (three) times daily. (Patient not taking: Reported on 04/17/2024) 60 capsule 0   glucose blood (ONETOUCH ULTRA) test strip Check blood sugars once daily 100 strip 12   hydrochlorothiazide  (HYDRODIURIL ) 12.5 MG tablet Take 1 tablet by mouth once daily 90 tablet 1   lisinopril  (ZESTRIL ) 20 MG tablet Take 1 tablet (20 mg total) by mouth daily. 90 tablet 0   lovastatin  (MEVACOR ) 20 MG tablet  Take 1 tablet (20 mg total) by mouth at bedtime. (Patient not taking: Reported on 04/17/2024) 90 tablet 3   metFORMIN  (GLUCOPHAGE ) 1000 MG tablet Take 1 tablet (1,000 mg total) by mouth daily with breakfast. (Patient not taking: Reported on 04/17/2024)     Multiple Vitamins-Minerals (DAILY MULTIVITAMIN PO) Take by mouth. (Patient not taking: Reported on 04/17/2024)     mupirocin  ointment (BACTROBAN ) 2 % Apply 1 Application topically 2 (two) times daily. 22 g 0   rosuvastatin  (CRESTOR ) 20 MG tablet Take 1 tablet (20 mg total) by mouth daily. (Patient not taking: Reported on 04/17/2024) 30 tablet 0     Discharge Medications: Please see discharge summary for a list of discharge medications.  Relevant Imaging Results:  Relevant Lab Results:   Additional Information SSN:[id number]  Kari JONETTA Daisy, LCSW     "

## 2024-04-27 NOTE — ED Notes (Signed)
Pt assisted with bed bath

## 2024-04-27 NOTE — Progress Notes (Signed)
 CSW spoke with Lisa Meyers who reported the financial team is reviewing documentation provided and she should have an answer on pt shortly.

## 2024-04-27 NOTE — ED Provider Notes (Signed)
 Emergency Medicine Observation Re-evaluation Note  Lisa Meyers is a 69 y.o. female, seen on rounds today.  Pt initially presented to the ED for complaints of Failure To Thrive Currently, the patient is resting comfortably, no concerns from nursing staff.  Patient has past medical history of HTN, diabetes, HLD, Parkinson's and had come to the emergency room for weakness and inability to take care of herself.  Social work consulted.  They are currently seeking placement  Physical Exam  BP (!) 137/91   Pulse 87   Temp 98.1 F (36.7 C) (Oral)   Resp 16   SpO2 99%  Physical Exam General: No acute distress  ED Course / MDM  EKG:EKG Interpretation Date/Time:  Wednesday April 25 2024 19:34:03 EST Ventricular Rate:  77 PR Interval:  170 QRS Duration:  70 QT Interval:  426 QTC Calculation: 482 R Axis:   24  Text Interpretation: Normal sinus rhythm Cannot rule out Anterior infarct , age undetermined Abnormal ECG No significant change since last tracing Confirmed by Emil Share [phone]) on 04/25/2024 10:58:52 PM  I have reviewed the labs performed to date as well as medications administered while in observation.  Recent changes in the last 24 hours include -no new changes.  It appears that social work is looking for placement, and awaiting bed availability.  Plan  Current plan is for holding patient for placement.    Charlyn Sora, MD 04/27/24 [phone]

## 2024-04-27 NOTE — Progress Notes (Signed)
 CSW received communication from [facility]PS that regarding the vehicles and provided documentation that tags were turned in over the last few years. CSW updated Lisa Meyers who reports Lisa Meyers does not have a bed but she will review documentation and outreach to [facility] to see if they can still offer. ICM following.

## 2024-04-28 LAB — CBC WITH DIFFERENTIAL/PLATELET
Abs Immature Granulocytes: 0 K/uL (ref 0.00–0.07)
Basophils Absolute: 0 K/uL (ref 0.0–0.1)
Basophils Relative: 0 %
Eosinophils Absolute: 0 K/uL (ref 0.0–0.5)
Eosinophils Relative: 1 %
HCT: 37.7 % (ref 36.0–46.0)
Hemoglobin: 12.5 g/dL (ref 12.0–15.0)
Immature Granulocytes: 0 %
Lymphocytes Relative: 55 %
Lymphs Abs: 1.8 K/uL (ref 0.7–4.0)
MCH: 30.1 pg (ref 26.0–34.0)
MCHC: 33.2 g/dL (ref 30.0–36.0)
MCV: 90.8 fL (ref 80.0–100.0)
Monocytes Absolute: 0.3 K/uL (ref 0.1–1.0)
Monocytes Relative: 8 %
Neutro Abs: 1.2 K/uL — ABNORMAL LOW (ref 1.7–7.7)
Neutrophils Relative %: 36 %
Platelets: 241 K/uL (ref 150–400)
RBC: 4.15 MIL/uL (ref 3.87–5.11)
RDW: 13 % (ref 11.5–15.5)
WBC: 3.4 K/uL — ABNORMAL LOW (ref 4.0–10.5)
nRBC: 0 % (ref 0.0–0.2)

## 2024-04-28 LAB — COMPREHENSIVE METABOLIC PANEL WITH GFR
ALT: 11 U/L (ref 0–44)
AST: 17 U/L (ref 15–41)
Albumin: 3.5 g/dL (ref 3.5–5.0)
Alkaline Phosphatase: 77 U/L (ref 38–126)
Anion gap: 6 (ref 5–15)
BUN: 18 mg/dL (ref 8–23)
CO2: 30 mmol/L (ref 22–32)
Calcium: 9.9 mg/dL (ref 8.9–10.3)
Chloride: 105 mmol/L (ref 98–111)
Creatinine, Ser: 0.64 mg/dL (ref 0.44–1.00)
GFR, Estimated: >60 mL/min
Glucose, Bld: 99 mg/dL (ref 70–99)
Potassium: 3.9 mmol/L (ref 3.5–5.1)
Sodium: 141 mmol/L (ref 135–145)
Total Bilirubin: 0.2 mg/dL (ref 0.0–1.2)
Total Protein: 6.4 g/dL — ABNORMAL LOW (ref 6.5–8.1)

## 2024-04-28 LAB — URINALYSIS, ROUTINE W REFLEX MICROSCOPIC
Bacteria, UA: NONE SEEN
Bilirubin Urine: NEGATIVE
Glucose, UA: >=500 mg/dL — AB
Hgb urine dipstick: NEGATIVE
Ketones, ur: NEGATIVE mg/dL
Leukocytes,Ua: NEGATIVE
Nitrite: NEGATIVE
Protein, ur: NEGATIVE mg/dL
Specific Gravity, Urine: 1.007 (ref 1.005–1.030)
pH: 7 (ref 5.0–8.0)

## 2024-04-28 LAB — I-STAT CG4 LACTIC ACID, ED: Lactic Acid, Venous: 0.8 mmol/L (ref 0.5–1.9)

## 2024-04-28 LAB — TROPONIN T, HIGH SENSITIVITY
Troponin T High Sensitivity: <15 ng/L (ref 0–19)
Troponin T High Sensitivity: 17 ng/L (ref 0–19)

## 2024-04-28 MED ORDER — SODIUM CHLORIDE 0.9 % IV BOLUS
500.0000 mL | Freq: Once | INTRAVENOUS | Status: AC
  Administered 2024-04-28: 500 mL via INTRAVENOUS

## 2024-04-28 MED ORDER — POLYETHYLENE GLYCOL 3350 17 G PO PACK
17.0000 g | PACK | Freq: Every day | ORAL | Status: DC
  Administered 2024-04-28 – 2024-05-03 (×6): 17 g via ORAL
  Filled 2024-04-28 (×6): qty 1

## 2024-04-28 MED ORDER — DOCUSATE SODIUM 100 MG PO CAPS
100.0000 mg | ORAL_CAPSULE | Freq: Two times a day (BID) | ORAL | Status: DC | PRN
  Filled 2024-04-28: qty 1

## 2024-04-28 MED ORDER — LACTATED RINGERS IV BOLUS
1000.0000 mL | Freq: Once | INTRAVENOUS | Status: AC
  Administered 2024-04-28: 1000 mL via INTRAVENOUS

## 2024-04-28 NOTE — ED Provider Notes (Signed)
 Emergency Medicine Observation Re-evaluation Note  Lisa Meyers is a 69 y.o. female, seen on rounds today.  Pt initially presented to the ED for complaints of Failure To Thrive Currently, the patient is resting comfortably, no concerns from nursing staff.  Patient has past medical history of HTN, diabetes, HLD, Parkinson's and had come to the emergency room for weakness and inability to take care of herself.  Social work consulted.  They are currently seeking placement  Physical Exam  BP (!) 84/52 (BP Location: Left Arm)   Pulse 63   Temp (!) 97.2 F (36.2 C) (Oral)   Resp 18   SpO2 98%  Physical Exam General: No acute distress  ED Course / MDM  EKG:EKG Interpretation Date/Time:  Wednesday April 25 2024 19:34:03 EST Ventricular Rate:  77 PR Interval:  170 QRS Duration:  70 QT Interval:  426 QTC Calculation: 482 R Axis:   24  Text Interpretation: Normal sinus rhythm Cannot rule out Anterior infarct , age undetermined Abnormal ECG No significant change since last tracing Confirmed by Emil Share [phone]) on 04/25/2024 10:58:52 PM  I have reviewed the labs performed to date as well as medications administered while in observation.  Recent changes in the last 24 hours include -no new changes.  It appears that social work is looking for placement, and awaiting bed availability.  Plan  Current plan is for holding patient for placement.    2:41 PM Patient now hypotensive into the 80s systolic. She is afebrile. Had WBC 3.3 on 04/25/24 but reassuring other labs. Will trial 1L IVF and reassess.     Franklyn Sid SAILOR, MD 04/28/24 [phone]

## 2024-04-28 NOTE — ED Provider Notes (Signed)
 69 yo female with h/o HTN, DM, HLD, Parkinsons who is in the emergency department for generalized weakness and inability to care for herself.  No obvious medical concerns.  Social work has been consulted and is seeking placement.  Noted to have low blood pressure in the 80s during the day today.  Given IV fluids and recheck labs which are reassuring.  Clinical Course as of 04/28/24 2023  Wed Apr 25, 2024  2300 Specific Gravity, Urine(!): 1.033 Provided IVF for hydration [LB]  Sat Apr 28, 2024  1455 Hypotensive. Can hold lisinopril  20 mg qd. Ordered fluids. [HN]  2020 Blood pressure has improved to 104/69 with fluids and holding lisinopril .  Heart rate remained stable.  Repeat labs are reassuring. [AJ]  2021 EKG is nonischemic and troponin negative. [AJ]  2021 Overall continue to monitor vital signs and clinical status and await disposition. [AJ]    Clinical Course User Index [AJ] Fredia Rosette Kirsch, MD [HN] Franklyn Sid SAILOR, MD [LB] Minnie Tinnie BRAVO, PA      Fredia Rosette Kirsch, MD 04/28/24 [phone]

## 2024-04-28 NOTE — ED Notes (Signed)
 Provided patient graham crackers/cranberry juice

## 2024-04-28 NOTE — ED Notes (Signed)
 Pt assisted to [facility] with max 1 assist due to being unsteady. Given tooth brush and toothpaste and was able to brush teeth on her own. Pt then requested ointment to be placed on a sacral wound. Small stage one sacral ulcer noted. Mepilex placed on at this time.

## 2024-04-29 MED ORDER — DOCUSATE SODIUM 100 MG PO CAPS
100.0000 mg | ORAL_CAPSULE | Freq: Two times a day (BID) | ORAL | Status: DC
  Administered 2024-04-29 – 2024-05-03 (×7): 100 mg via ORAL
  Filled 2024-04-29 (×8): qty 1

## 2024-04-30 NOTE — ED Provider Notes (Signed)
 Emergency Medicine Observation Re-evaluation Note  Lisa Meyers is a 69 y.o. female, seen on rounds today.  Pt initially presented to the ED for complaints of Failure To Thrive Currently, the patient is sleeping.  Physical Exam  BP (!) 104/91   Pulse 69   Temp 97.7 F (36.5 C) (Oral)   Resp 14   SpO2 100%  Physical Exam General: nad Cardiac: regular Lungs: clear   ED Course / MDM  EKG:EKG Interpretation Date/Time:  Saturday April 28 2024 17:26:23 EST Ventricular Rate:  60 PR Interval:  177 QRS Duration:  83 QT Interval:  392 QTC Calculation: 392 R Axis:   14  Text Interpretation: Sinus rhythm Abnormal R-wave progression, early transition No significant change since last tracing Confirmed by Dean Clarity [phone]) on 04/29/2024 1:52:36 PM  I have reviewed the labs performed to date as well as medications administered while in observation.  Recent changes in the last 24 hours include did receive fluids for hypotension but resolved and lisinopril  held.  Plan  Current plan is for placement.    Doretha Folks, MD 04/30/24 [phone]

## 2024-04-30 NOTE — ED Notes (Signed)
"  Patient provided with breakfast tray   "

## 2024-04-30 NOTE — Progress Notes (Addendum)
 [place], [facility] rep, still has questions regarding pt's vehicles. CSW informed Leonor Ply and requested she call [employer] directly to resolve any confusion related to the assets. ICM following.  CSW anticipates delays due to holiday and DSS being closed.

## 2024-05-01 MED ORDER — SENNOSIDES-DOCUSATE SODIUM 8.6-50 MG PO TABS
1.0000 | ORAL_TABLET | Freq: Once | ORAL | Status: AC
  Administered 2024-05-01: 1 via ORAL
  Filled 2024-05-01: qty 1

## 2024-05-01 NOTE — Progress Notes (Addendum)
 [place] reported [place], [place] rep, will be contacting [place] directly. ICM following.  Addend @ 12:37PM CSW requested update from [facility]. Awaiting response.   Addend @ 6:55PM Leonor reported Ebony spoke with Leontine at [facility] to inform vehicles will not be an issue with medicaid. They are awaiting a call back from [facility]. ICM following.

## 2024-05-01 NOTE — ED Provider Notes (Signed)
 Emergency Medicine Observation Re-evaluation Note  Lisa Meyers is a 69 y.o. female, seen on rounds today.  Pt initially presented to the ED for complaints of Failure To Thrive Currently, the patient is sleeping.  Physical Exam  BP 111/69 (BP Location: Left Arm)   Pulse (!) 57   Temp 98.4 F (36.9 C) (Oral)   Resp 16   SpO2 100%  Physical Exam General: Calm Cardiac: Well perfused Lungs: Even respirations Psych: Calm  ED Course / MDM  EKG:EKG Interpretation Date/Time:  Saturday April 28 2024 17:26:23 EST Ventricular Rate:  60 PR Interval:  177 QRS Duration:  83 QT Interval:  392 QTC Calculation: 392 R Axis:   14  Text Interpretation: Sinus rhythm Abnormal R-wave progression, early transition No significant change since last tracing Confirmed by Dean Clarity [phone]) on 04/29/2024 1:52:36 PM  I have reviewed the labs performed to date as well as medications administered while in observation.  Recent changes in the last 24 hours include N/A.  Plan  Current plan is for placement.    Darra Fonda MATSU, MD 05/01/24 1446

## 2024-05-02 NOTE — ED Notes (Signed)
 Patient has been alert and oriented this shift. Patient has been cooperative with care.. Patient medication compliant. Patient ate three meals. Patient needs assist with ambulation.  Patient has been tearful this shift, patient comforted and reassured.

## 2024-05-02 NOTE — Progress Notes (Signed)
 Physical Therapy Treatment Patient Details Name: Lisa Meyers MRN: [id number] DOB: 1955/09/30 Today's Date: 05/02/2024   History of Present Illness Lisa Meyers is a 69 y.o. female presents to Emergency Department 1`/14/25  for evaluation of decreased appetite, decreased oral intake, generalized weakness, constipation for past 2 weeks. Was in ED 1/6 for abdominal pain/constipation  and DC'd Past medical history of HTN, diabetes, HLD, Parkinson's    PT Comments  Pt resting in bed, agreeable to OOB activity. All mobility reflective of Parkinson's with delayed initiation, posterior lean with initial standing and slow ambulation. Noted rest tremors R>L in UE and LE. Patient ambulates to bathroom, completes pericare at [facility] followed by standing at sink at [facility] for hand hygiene with good sequencing of task. Improved gait distance tolerate today, no balance deficits noted when completing turns, and patient able to participate in dual task with questions/answers while ambulating. Returned to room, set up in bed with several pillows surrounding her for comfort. Pt continues to be limited by decreased functional strength and endurance. PT continues to recommend low intensity therapy for <3hrs/day at discharge.     If plan is discharge home, recommend the following: A little help with walking and/or transfers;A little help with bathing/dressing/bathroom;Help with stairs or ramp for entrance;Assist for transportation;Assistance with cooking/housework   Can travel by private vehicle        Equipment Recommendations  None recommended by PT    Recommendations for Other Services       Precautions / Restrictions Precautions Precautions: Fall Recall of Precautions/Restrictions: Impaired Precaution/Restrictions Comments: resting tremors noted R>L Restrictions Weight Bearing Restrictions Per Provider Order: No     Mobility  Bed Mobility Overal bed mobility: Needs Assistance Bed Mobility: Supine to Sit,  Sit to Supine     Supine to sit: Contact guard Sit to supine: Min assist   General bed mobility comments: CGA to sit to EOB with increased time and effort, intermittent cues for sequencing; MIN A to bring BLE into bed secondary to delayed initiation, additional assistance to adjust positioning once in bed    Transfers Overall transfer level: Needs assistance Equipment used: None Transfers: Sit to/from Stand Sit to Stand: Min assist           General transfer comment: MIN A to stand from EOB and low toilet with hand support on rail and balance assistance due to posterior lean    Ambulation/Gait Ambulation/Gait assistance: Contact guard assist Gait Distance (Feet): 150 Feet Assistive device: None   Gait velocity: decreased     General Gait Details: slight forward flexed posture, improved balance when walking vs initial standing with no posterior lean noted. good step sequencing, step length equal BLE, completes x4 180deg turns during distance completed   Stairs             Wheelchair Mobility     Tilt Bed    Modified Rankin (Stroke Patients Only)       Balance Overall balance assessment: History of Falls, Needs assistance Sitting-balance support: Feet supported, Bilateral upper extremity supported Sitting balance-Leahy Scale: Fair     Standing balance support: During functional activity, No upper extremity supported Standing balance-Leahy Scale: Fair Standing balance comment: demos posterior lean with initial standing requiring Min A, balance then improves with ambulation                            Communication Communication Communication: Impaired Factors Affecting Communication: Reduced clarity of  speech  Cognition Arousal: Alert Behavior During Therapy: WFL for tasks assessed/performed   PT - Cognitive impairments: No family/caregiver present to determine baseline, History of cognitive impairments                       PT -  Cognition Comments: oriented to date as it is her birthday, continues to perseverate on medication. Impaired problem solving and safety awareness with mobility Following commands: Impaired Following commands impaired: Follows multi-step commands inconsistently, Follows one step commands with increased time    Cueing Cueing Techniques: Verbal cues, Tactile cues  Exercises      General Comments        Pertinent Vitals/Pain Pain Assessment Pain Assessment: No/denies pain    Home Living                          Prior Function            PT Goals (current goals can now be found in the care plan section) Acute Rehab PT Goals Patient Stated Goal: agreed on rehab PT Goal Formulation: With patient Time For Goal Achievement: 05/10/24 Potential to Achieve Goals: Fair Progress towards PT goals: Progressing toward goals    Frequency    Min 2X/week      PT Plan      Co-evaluation              AM-PAC PT 6 Clicks Mobility   Outcome Measure  Help needed turning from your back to your side while in a flat bed without using bedrails?: A Little Help needed moving from lying on your back to sitting on the side of a flat bed without using bedrails?: A Little Help needed moving to and from a bed to a chair (including a wheelchair)?: A Little Help needed standing up from a chair using your arms (e.g., wheelchair or bedside chair)?: A Little Help needed to walk in hospital room?: A Little Help needed climbing 3-5 steps with a railing? : A Lot 6 Click Score: 17    End of Session Equipment Utilized During Treatment: Gait belt Activity Tolerance: Patient tolerated treatment well Patient left: in bed;with call bell/phone within reach;with bed alarm set Nurse Communication: Mobility status PT Visit Diagnosis: Unsteadiness on feet (R26.81);History of falling (Z91.81)     Time: 1540-1601 PT Time Calculation (min) (ACUTE ONLY): 21 min  Charges:    $Therapeutic  Activity: 8-22 mins                       Isaiah DEL. Ottis Vacha, PT, DPT    [employer] 05/02/2024, 4:14 PM

## 2024-05-02 NOTE — Progress Notes (Addendum)
 CSW outreached to [facility] to inquire about bed offer.  Addend @9 :23AM Logan states she's waiting on an answer from higher ups.   Addend @10 :11AM [place] [facility] reviewing.  Addend @ 10:37AM [facility] accepted. DSS has ex-parte order and would like for this writer to begin auth. Logan reported [facility] will initiate auth for pt.

## 2024-05-02 NOTE — ED Provider Notes (Signed)
 Emergency Medicine Observation Re-evaluation Note  Lisa Meyers is a 69 y.o. female, seen on rounds today.  Pt initially presented to the ED for complaints of Failure To Thrive Currently, the patient is here for failure to thrive.  She is currently on the bedside commode but has no other complaints.  Physical Exam  BP 113/77 (BP Location: Right Arm)   Pulse 63   Temp (!) 97.3 F (36.3 C)   Resp 18   SpO2 99%  Physical Exam General: Elderly cachectic female does not appear to be in any acute distress Cardiac: Blood pressure normal at 113/77 heart rate normal at 63 Lungs: Patient appears to be in no respiratory distress with no increased work of breathing and normal oxygen saturations noted Psych: Patient voices no complaints to me  ED Course / MDM  EKG:EKG Interpretation Date/Time:  Saturday April 28 2024 17:26:23 EST Ventricular Rate:  60 PR Interval:  177 QRS Duration:  83 QT Interval:  392 QTC Calculation: 392 R Axis:   14  Text Interpretation: Sinus rhythm Abnormal R-wave progression, early transition No significant change since last tracing Confirmed by Dean Clarity [phone]) on 04/29/2024 1:52:36 PM  I have reviewed the labs performed to date as well as medications administered while in observation.  Recent changes in the last 24 hours include none noted.  Plan  Current plan is for placement.    Levander Houston, MD 05/02/24 [phone]

## 2024-05-03 NOTE — Progress Notes (Addendum)
 CSW sent message to [facility] inquiring about ability to accept pt today under 5-day LOG while awaiting insurance authorization approval. CSW awaiting response.  UPDATE 1pm- [facility] accepted LOG and pt can dc to facility today. Leadership and team notified. Call report [phone]

## 2024-05-03 NOTE — ED Notes (Signed)
 [facility] called for transport.

## 2024-05-03 NOTE — ED Provider Notes (Signed)
 Emergency Medicine Observation Re-evaluation Note  Lisa Meyers is a 69 y.o. female, seen on rounds today.  Pt initially presented to the ED for complaints of Failure To Thrive Currently, the patient is resting comfortably.  Just had a shower.  Physical Exam  BP 108/71 (BP Location: Right Arm)   Pulse (!) 58   Temp 98.4 F (36.9 C)   Resp 16   SpO2 100%  Physical Exam   ED Course / MDM  EKG:EKG Interpretation Date/Time:  Saturday April 28 2024 17:26:23 EST Ventricular Rate:  60 PR Interval:  177 QRS Duration:  83 QT Interval:  392 QTC Calculation: 392 R Axis:   14  Text Interpretation: Sinus rhythm Abnormal R-wave progression, early transition No significant change since last tracing Confirmed by Dean Clarity [phone]) on 04/29/2024 1:52:36 PM  I have reviewed the labs performed to date as well as medications administered while in observation.  Recent changes in the last 24 hours include none.  Plan  Current plan is for placement.    Dasie Faden, MD 05/03/24 [phone]

## 2024-05-03 NOTE — ED Notes (Signed)
[facility] called again
